# Patient Record
Sex: Female | Born: 1940
Health system: Southern US, Community
[De-identification: ages and names within clinical notes are randomized; demographics above are authoritative.]

## PROBLEM LIST (undated history)

## (undated) DIAGNOSIS — M8580 Other specified disorders of bone density and structure, unspecified site: Secondary | ICD-10-CM

## (undated) DIAGNOSIS — S060XAA Concussion with loss of consciousness status unknown, initial encounter: Secondary | ICD-10-CM

## (undated) DIAGNOSIS — K5790 Diverticulosis of intestine, part unspecified, without perforation or abscess without bleeding: Secondary | ICD-10-CM

## (undated) DIAGNOSIS — Z5189 Encounter for other specified aftercare: Secondary | ICD-10-CM

## (undated) DIAGNOSIS — D649 Anemia, unspecified: Secondary | ICD-10-CM

## (undated) DIAGNOSIS — K5792 Diverticulitis of intestine, part unspecified, without perforation or abscess without bleeding: Secondary | ICD-10-CM

## (undated) DIAGNOSIS — T7840XA Allergy, unspecified, initial encounter: Secondary | ICD-10-CM

## (undated) DIAGNOSIS — D126 Benign neoplasm of colon, unspecified: Secondary | ICD-10-CM

## (undated) DIAGNOSIS — M1990 Unspecified osteoarthritis, unspecified site: Secondary | ICD-10-CM

## (undated) DIAGNOSIS — I10 Essential (primary) hypertension: Secondary | ICD-10-CM

## (undated) DIAGNOSIS — N390 Urinary tract infection, site not specified: Secondary | ICD-10-CM

## (undated) DIAGNOSIS — F32A Depression, unspecified: Secondary | ICD-10-CM

## (undated) DIAGNOSIS — F419 Anxiety disorder, unspecified: Secondary | ICD-10-CM

## (undated) DIAGNOSIS — S060X9A Concussion with loss of consciousness of unspecified duration, initial encounter: Secondary | ICD-10-CM

## (undated) DIAGNOSIS — J45909 Unspecified asthma, uncomplicated: Secondary | ICD-10-CM

## (undated) DIAGNOSIS — K219 Gastro-esophageal reflux disease without esophagitis: Secondary | ICD-10-CM

## (undated) DIAGNOSIS — C50919 Malignant neoplasm of unspecified site of unspecified female breast: Secondary | ICD-10-CM

## (undated) DIAGNOSIS — H269 Unspecified cataract: Secondary | ICD-10-CM

## (undated) DIAGNOSIS — G709 Myoneural disorder, unspecified: Secondary | ICD-10-CM

## (undated) DIAGNOSIS — U071 COVID-19: Secondary | ICD-10-CM

## (undated) DIAGNOSIS — J189 Pneumonia, unspecified organism: Secondary | ICD-10-CM

## (undated) DIAGNOSIS — M7630 Iliotibial band syndrome, unspecified leg: Secondary | ICD-10-CM

## (undated) HISTORY — DX: Malignant neoplasm of unspecified site of unspecified female breast: C50.919

## (undated) HISTORY — DX: Diverticulosis of intestine, part unspecified, without perforation or abscess without bleeding: K57.90

## (undated) HISTORY — DX: Myoneural disorder, unspecified: G70.9

## (undated) HISTORY — DX: Encounter for other specified aftercare: Z51.89

## (undated) HISTORY — DX: Concussion with loss of consciousness status unknown, initial encounter: S06.0XAA

## (undated) HISTORY — DX: Concussion with loss of consciousness of unspecified duration, initial encounter: S06.0X9A

## (undated) HISTORY — DX: Urinary tract infection, site not specified: N39.0

## (undated) HISTORY — PX: CATARACT EXTRACTION W/ INTRAOCULAR LENS  IMPLANT, BILATERAL: SHX1307

## (undated) HISTORY — DX: Depression, unspecified: F32.A

## (undated) HISTORY — DX: Unspecified asthma, uncomplicated: J45.909

## (undated) HISTORY — PX: BREAST LUMPECTOMY: SHX2

## (undated) HISTORY — DX: COVID-19: U07.1

## (undated) HISTORY — PX: POLYPECTOMY: SHX149

## (undated) HISTORY — DX: Other specified disorders of bone density and structure, unspecified site: M85.80

## (undated) HISTORY — DX: Unspecified cataract: H26.9

## (undated) HISTORY — DX: Anxiety disorder, unspecified: F41.9

## (undated) HISTORY — DX: Diverticulitis of intestine, part unspecified, without perforation or abscess without bleeding: K57.92

## (undated) HISTORY — DX: Essential (primary) hypertension: I10

## (undated) HISTORY — PX: OCCIPITAL NEURECTOMY: SHX2096

## (undated) HISTORY — DX: Gastro-esophageal reflux disease without esophagitis: K21.9

## (undated) HISTORY — DX: Allergy, unspecified, initial encounter: T78.40XA

## (undated) HISTORY — DX: Iliotibial band syndrome, unspecified leg: M76.30

## (undated) HISTORY — DX: Benign neoplasm of colon, unspecified: D12.6

## (undated) HISTORY — PX: TRIGGER FINGER RELEASE: SHX641

## (undated) HISTORY — PX: OTHER SURGICAL HISTORY: SHX169

## (undated) HISTORY — PX: UPPER GASTROINTESTINAL ENDOSCOPY: SHX188

## (undated) HISTORY — DX: Anemia, unspecified: D64.9

## (undated) HISTORY — PX: COLONOSCOPY: SHX174

## (undated) HISTORY — DX: Pneumonia, unspecified organism: J18.9

---
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
## 1986-11-11 HISTORY — PX: APPENDECTOMY: SHX54

## 1986-11-11 HISTORY — PX: TOTAL ABDOMINAL HYSTERECTOMY: SHX209

## 1992-11-11 HISTORY — PX: NASAL SINUS SURGERY: SHX719

## 1998-12-07 ENCOUNTER — Emergency Department (HOSPITAL_COMMUNITY): Admission: EM | Admit: 1998-12-07 | Discharge: 1998-12-07 | Payer: Self-pay | Admitting: Emergency Medicine

## 1999-09-12 HISTORY — PX: BACK SURGERY: SHX140

## 1999-09-28 ENCOUNTER — Inpatient Hospital Stay (HOSPITAL_COMMUNITY): Admission: RE | Admit: 1999-09-28 | Discharge: 1999-09-30 | Payer: Self-pay | Admitting: Neurosurgery

## 1999-09-28 ENCOUNTER — Encounter: Payer: Self-pay | Admitting: Neurosurgery

## 2000-03-02 ENCOUNTER — Ambulatory Visit (HOSPITAL_COMMUNITY): Admission: RE | Admit: 2000-03-02 | Discharge: 2000-03-02 | Payer: Self-pay | Admitting: Neurosurgery

## 2000-03-02 ENCOUNTER — Encounter: Payer: Self-pay | Admitting: Neurosurgery

## 2002-08-11 HISTORY — PX: LIPOMA EXCISION: SHX5283

## 2002-12-21 ENCOUNTER — Other Ambulatory Visit: Admission: RE | Admit: 2002-12-21 | Discharge: 2002-12-21 | Payer: Self-pay | Admitting: Obstetrics and Gynecology

## 2004-07-13 ENCOUNTER — Encounter: Payer: Self-pay | Admitting: Cardiology

## 2004-07-13 ENCOUNTER — Observation Stay (HOSPITAL_COMMUNITY): Admission: EM | Admit: 2004-07-13 | Discharge: 2004-07-14 | Payer: Self-pay | Admitting: Emergency Medicine

## 2004-10-18 ENCOUNTER — Encounter
Admission: RE | Admit: 2004-10-18 | Discharge: 2004-10-18 | Payer: Self-pay | Admitting: Physical Medicine and Rehabilitation

## 2004-11-09 ENCOUNTER — Encounter
Admission: RE | Admit: 2004-11-09 | Discharge: 2004-11-09 | Payer: Self-pay | Admitting: Physical Medicine and Rehabilitation

## 2005-06-04 ENCOUNTER — Ambulatory Visit: Payer: Self-pay | Admitting: Internal Medicine

## 2005-09-02 ENCOUNTER — Ambulatory Visit: Payer: Self-pay | Admitting: Internal Medicine

## 2005-12-02 ENCOUNTER — Ambulatory Visit: Payer: Self-pay | Admitting: Internal Medicine

## 2006-02-17 ENCOUNTER — Ambulatory Visit: Payer: Self-pay | Admitting: Internal Medicine

## 2006-03-11 ENCOUNTER — Ambulatory Visit: Payer: Self-pay | Admitting: Internal Medicine

## 2006-06-09 ENCOUNTER — Ambulatory Visit: Payer: Self-pay | Admitting: Internal Medicine

## 2007-06-24 ENCOUNTER — Ambulatory Visit: Payer: Self-pay | Admitting: Internal Medicine

## 2007-07-08 ENCOUNTER — Ambulatory Visit: Payer: Self-pay | Admitting: Internal Medicine

## 2009-11-11 DIAGNOSIS — C50919 Malignant neoplasm of unspecified site of unspecified female breast: Secondary | ICD-10-CM

## 2009-11-11 HISTORY — DX: Malignant neoplasm of unspecified site of unspecified female breast: C50.919

## 2010-09-04 HISTORY — PX: BREAST LUMPECTOMY: SHX2

## 2010-09-05 ENCOUNTER — Ambulatory Visit: Payer: Self-pay | Admitting: Vascular Surgery

## 2010-12-02 ENCOUNTER — Encounter: Payer: Self-pay | Admitting: Radiology

## 2011-01-17 ENCOUNTER — Ambulatory Visit: Payer: Medicare Other | Attending: Radiation Oncology | Admitting: Radiation Oncology

## 2011-01-17 ENCOUNTER — Other Ambulatory Visit: Payer: Self-pay | Admitting: Radiation Oncology

## 2011-01-17 DIAGNOSIS — C50919 Malignant neoplasm of unspecified site of unspecified female breast: Secondary | ICD-10-CM | POA: Insufficient documentation

## 2011-01-17 DIAGNOSIS — G589 Mononeuropathy, unspecified: Secondary | ICD-10-CM | POA: Insufficient documentation

## 2011-01-17 DIAGNOSIS — H6690 Otitis media, unspecified, unspecified ear: Secondary | ICD-10-CM | POA: Insufficient documentation

## 2011-01-17 DIAGNOSIS — L589 Radiodermatitis, unspecified: Secondary | ICD-10-CM | POA: Insufficient documentation

## 2011-01-17 DIAGNOSIS — Z90710 Acquired absence of both cervix and uterus: Secondary | ICD-10-CM | POA: Insufficient documentation

## 2011-01-17 DIAGNOSIS — L538 Other specified erythematous conditions: Secondary | ICD-10-CM | POA: Insufficient documentation

## 2011-01-17 DIAGNOSIS — R5381 Other malaise: Secondary | ICD-10-CM | POA: Insufficient documentation

## 2011-01-17 DIAGNOSIS — Z510 Encounter for antineoplastic radiation therapy: Secondary | ICD-10-CM | POA: Insufficient documentation

## 2011-01-17 DIAGNOSIS — Y842 Radiological procedure and radiotherapy as the cause of abnormal reaction of the patient, or of later complication, without mention of misadventure at the time of the procedure: Secondary | ICD-10-CM | POA: Insufficient documentation

## 2011-01-17 DIAGNOSIS — Z9079 Acquired absence of other genital organ(s): Secondary | ICD-10-CM | POA: Insufficient documentation

## 2011-01-17 LAB — URINALYSIS, MICROSCOPIC - CHCC
Glucose: NEGATIVE g/dL
Nitrite: POSITIVE
Protein: <30 mg/dL
Specific Gravity, Urine: 1.025 (ref 1.003–1.035)

## 2011-01-19 LAB — URINE CULTURE

## 2011-03-26 NOTE — Procedures (Signed)
DUPLEX DEEP VENOUS EXAM - LOWER EXTREMITY   INDICATION:  A palpable area within the left lower extremity calf.   HISTORY:  Edema:  No.  Trauma/Surgery:  Patient is status post breast lumpectomy 09/04/2010.  Pain:  No.  PE:  No.  Previous DVT:  No.  Anticoagulants:  No.  Other:   DUPLEX EXAM:                CFV   SFV   PopV  PTV    GSV                R  L  R  L  R  L  R   L  R  L  Thrombosis    o  o     o     o      o     o  Spontaneous   +  +     +     +      +     +  Phasic        +  +     +     +      +     +  Augmentation  +  +     +     +      +     +  Compressible  +  +     +     +      +     +  Competent     +  +     +     +      +     +   Legend:  + - yes  o - no  p - partial  D - decreased   IMPRESSION:  No evidence of acute deep venous thrombosis or superficial  thrombophlebitis within the left lower extremity.    _____________________________  Quita Skye Hart Rochester, M.D.   OD/MEDQ  D:  09/05/2010  T:  09/05/2010  Job:  [id number]

## 2011-03-29 NOTE — H&P (Signed)
[name], [name]                           ACCOUNT NO.:  [id number]   MEDICAL RECORD NO.:  [id number]                   PATIENT TYPE:  EMS   LOCATION:  MAJO                                 FACILITY:  [facility]   PHYSICIAN:  Olga Millers, M.D. [facility]            DATE OF BIRTH:  [birth year]   DATE OF ADMISSION:  07/13/2004  DATE OF DISCHARGE:                                HISTORY & PHYSICAL   Ms. Kearley is a 70 year old female who is followed by E. Graceann Congress,  M.D., with a history of mitral valve prolapse.  She presents for evaluation  of chest pain.  She did state that she had epigastric pain approximately two  years ago in the setting of a stressful move from [place].  At that time  she had a nuclear study by Dr. Graceann Congress, and this showed an ejection  fraction of 70% as well as breast attenuation, but there was no ischemia or  scar.  She does aerobics routinely and does not have chest pain with  exertion.  There is no dyspnea on exertion, orthopnea, PND, pedal edema,  palpitations, presyncope, syncope.  The patient has had problems with aura  in the past where she has visual disturbances.  These are typically circles  in her eyes.  She has been evaluated by Dr. Cecilie Kicks in the past as well as  Dr. Alwyn Ren, and the workup has been negative.  This morning at approximately  6:30 she developed some visual aura.  She subsequently had epigastric  pressure.  There was associated weakness in her upper extremities  bilaterally.  She denies any loss of sensation, dysarthria, or incontinence.  There was no associated shortness of breath or diaphoresis, but there was  mild nausea.  The pain was not pleuritic.  Because of her symptoms, she  presented to the emergency room.  At the time of the evaluation, her  symptoms are much improved but still present.  Of note, she denies any  melena or acholic stools.  She has not had any recent travel.   Her medications at home include:  1.   Wellbutrin XL 300 mg p.o. daily.  2.  Recent typhoid pill, as she is planning on traveling to [place].  3.  Multivitamin.  4.  Vitamin B complex.  5.  Calcium.   She has no known drug allergies.   SOCIAL HISTORY:  She does smoke, but she does occasionally consume alcohol.   FAMILY HISTORY:  Positive for coronary artery disease.   PAST MEDICAL HISTORY:  There is no diabetes mellitus, hypertension, or  hyperlipidemia.  She has had previous mitral valve prolapse and a murmur by  report.   PAST SURGICAL HISTORY:  She has had prior back surgery and left breast cyst  removed.  She has also had sinus surgery and a prior hysterectomy.   REVIEW OF SYSTEMS:  She denies any headaches of fevers  or chills.  There is  no productive cough or hemoptysis.  There is no dysphagia, odynophagia,  melena, or hematochezia.  There is no dysuria or hematuria.  There is no  rash or seizure activity.  There is no orthopnea, PND, or pedal edema.  The  remaining systems are negative.   PHYSICAL EXAMINATION:  VITAL SIGNS:  She is afebrile.  Her pulse is 90.  Her  blood pressure in the right upper extremity is 146/79 and in the left upper  extremity is 147/80.  She is afebrile.  GENERAL:  She is well-developed and well-nourished, in no acute distress.  Her skin is warm and dry.  She does not appear to be depressed, and there is  no peripheral clubbing.  HEENT:  Unremarkable with normal eyelids.  NECK:  Supple with normal upstroke bilateral and no bruits.  There is no  jugular venous distention and no thyromegaly noted.  CHEST:  Clear to auscultation, normal expansion.  CARDIOVASCULAR:  Regular rate and rhythm with normal S1 and S2.  I cannot  appreciate murmurs, rubs, or gallops.  ABDOMEN:  There is no right upper quadrant tenderness or tenderness in any  other part of the exam.  There are no masses appreciated.  There is no  hepatosplenomegaly.  There is no abdominal bruit.  She has 2+ femoral pulses   bilaterally and no bruits.  EXTREMITIES:  No edema, and I can palpate no cord.  She has 2+ dorsalis  pedis pulses bilaterally.  NEUROLOGIC:  Grossly intact.   Her electrocardiogram shows normal sinus rhythm with nonspecific ST changes.  Initial head CT is negative.  The remaining laboratories are pending at the  time of this dictation.   DIAGNOSES:  1.  Chest pain.  2.  Visual aura.   PLAN:  Ms. Scardino presents with chest pain of uncertain etiology.  There are  no electrocardiographic changes, and the description is somewhat atypical.  However, we will treat with aspirin and Lopressor and we will add  intravenous heparin if her enzymes are positive.  We will rule out  myocardial infarction with serial enzymes, and we will check an  echocardiogram for wall motion.  If these are negative, then we will plan a  Cardiolite tomorrow morning for risk stratification.  I will add Protonix  for the possibility of GI pain.  We will also check liver functions, amylase  and lipase.  Of note, there is no right upper quadrant tenderness on exam.  It should be noted that she had equal blood pressures in both arms.  Her  history does not seem consistent with a pulmonary embolus.  She is  complaining of an aura, but she has had this multiple times previously and  apparently has had a previous negative evaluation.  Her head CT today is  negative.  We will ask her to follow up with Dr. Alwyn Ren concerning these  after discharge, as they have resolved at present.                                                Olga Millers, M.D. [facility]    BC/MEDQ  D:  07/13/2004  T:  07/13/2004  Job:  [id number]

## 2011-03-29 NOTE — Consult Note (Signed)
[place]  Patient:    [name]                         MRN: [id number] Proc. Date: 09/28/99 Adm. Date:  [id number] Attending:  Barton Fanny CC:         Hewitt Shorts, M.D.                          Consultation Report  HISTORY OF PRESENT ILLNESS:  The patient is a 70 year old, right-handed white female who was evaluated for a right lumbar radiculopathy.  She explains that a  week ago, she developed back pain which worsened and extended down through the right lower extremity to the right buttock, posterior thigh, lateral right leg, and into the dorsum of the right foot towards her right great toe.  The pain has suddenly worsened.  She developed numbness in the right great toe and began to notice that her right foot would flop.  She underwent an MRI scan in [place] which revealed degenerative disk disease at multiple levels of the lumbar spine. There was spondylitic disk protrusion into the left L2-3 foramen.  There is mild disk protrusion to the left at L4-5 into the neural foramen, and there is a right L5-S1 disk herniation with a fragment that has migrated rostrally behind the body of L5.  She has been treated with Tylenol with Codeine as well as Celebrex without relief.  At this time, she is admitted for a right L5-S1 lumbar laminotomy and microdiskectomy.  PAST MEDICAL HISTORY:  There is no history of hypertension, myocardial infarction, cancer, stroke, diabetes, peptic ulcer disease, or lung disease.  Previous surgery includes left breast surgery for fibroadenoma in 1962 with sinus surgery in 1994 and a hysterectomy in 1998.  ALLERGIES:  She denies allergies to medications.  CURRENT MEDICATIONS: 1. Premarin 0.625 mg q.d. for hormone replacement therapy. 2. Nasacort sprays, 2 sprays p.r.n. 3. Celebrex q.d. for leg and back pain. 4. Tylenol No. 3 every 4 to 6 hours for leg and back pain.  FAMILY  HISTORY:  Her mother died at age 56 of congestive heart failure.  Her father died at age 72 of lung cancer.  SOCIAL HISTORY:  The patient is married.  She and her husband work in [address], and live both in [place] part time and [place] part time.  She has a daughter who is a Advice worker in a family practice office in [place].  Her other daughter committed suicide two months ago.  The patient is continuing to mourn at this time.  The patient does not smoke.  She does drink wine on a daily basis.  She does not have a history of substance abuse.  REVIEW OF SYSTEMS:  Notable for those difficulties described in her History of Present Illness and Past Medical History but is otherwise unremarkable.  PHYSICAL EXAMINATION:  GENERAL:  The patient is a well-developed, well-nourished white female in no acute distress.  VITAL SIGNS:  Height is 5 feet, 2-1/2 inches, weight 168 pounds.  She is afebrile.  LUNGS: Clear to auscultation.  HEART:  Regular rate and rhythm.  Normal S1 and S2.  No murmur.  ABDOMEN:  Soft, nondistended.  Bowel sounds are present.  EXTREMITIES:  No clubbing, cyanosis, or edema.  VASCULAR:  Shows 1 to 2+ pulses in dorsalis pedis and posterior tibial arteries  bilaterally.  MUSCULOSKELETAL:  There is no tenderness to palpation of the lumbar spinous process or paralumbar musculature.  She will flex 90 degrees but has pain down to the right lower extremity when she flexes forwards.  She has a jolt of pain when she extends her back.  Straight leg raising is negative on the left, but on the right, there is a positive straight leg raising 45 degrees with pain running down into the right lower extremity.  NEUROLOGIC:  Examination shows significant weakness in distal right lower extremity.  Dorsiflexion and extensor hallucis longus are both 3/5.  Plantar flexion is 4+/5.  The corresponding left lower extremity musculature is  all 5/5. Sensation is decreased to pinprick in the lateral aspect of her right leg. Reflexes are 2 at the quadriceps bilaterally.  The left gastrocnemius is absent, the right is 1.  The toes are downgoing bilaterally.  IMPRESSION:  Right L5 radiculopathy with significant weakness of extensor hallucis longus and dorsiflexor secondary to a right L5-S1 lumbar disk herniation with a  free fragment that has migrated rostrally behind the body of L5.  PLAN:  The patient will be admitted for a right L5-S1 lumbar laminotomy and microdiskectomy.  We discussed the nature of surgery, alternatives to the surgical procedure, typical length of surgery, hospital stay, and overall recuperation, limitations during the postoperative period, and risk of surgery including risk of infection, bleeding, possible need for transfusion, risk of nerve dysfunction, pain, weakness, numbness, paresthesias, the risk of recurrent disk herniation, possible need for further surgery, anesthetic risks of myocardial infarction, stroke, pneumonia, and death.  Understanding all of this, she does wish to proceed with surgery.  She understands the uncertainty of motor recovery, but she also understands that the longer surgical decompression is put off, the lower the chance of motor recovery.  Again, understanding all of this, she does wish to proceed ith surgery and is admitted for such. DD:  09/28/99 TD:  09/28/99 Job: [id number]

## 2011-03-29 NOTE — Discharge Summary (Signed)
[name], [name]                           ACCOUNT NO.:  [id number]   MEDICAL RECORD NO.:  [id number]                   PATIENT TYPE:  INP   LOCATION:  4705                                 FACILITY:  [facility]   PHYSICIAN:  Olga Millers, M.D. [facility]            DATE OF BIRTH:  Jun 28, 1941   DATE OF ADMISSION:  07/13/2004  DATE OF DISCHARGE:  07/14/2004                                 DISCHARGE SUMMARY   PROCEDURES:  1.  2D echocardiogram.  2.  CT of the head.   HOSPITAL COURSE:  [name] is a 70 year old female with no known history  of coronary artery disease.  She was in her usual state of health and at  6:30 on the day of admission got up to do some work.  She had an episode of  blurred vision that lasted about 30 minutes and was a partial visual field  loss by her description, on the left side.  She had similar episodes in 1996  and 1997, and her primary care physician did not find a clear cause, but  felt that they were possibly secondary to an atypical migraine.  Associated  with this, she had epigastric pressure and was demonstrating a positive  Levine's sign.  She also had bilateral arm weakness and some dizziness and  nausea.  She also complained of some fatigue and a little dysphagia.  She  had had the visual field problems before several years ago, but never this  symptom combination before.  Her last episode of chest pressure was in 2003  and not associated with any other symptoms and felt secondary to stress due  to the loss of her daughter.  She had an exercise treadmill Cardiolite at  that time that was negative.  She was admitted for further evaluation and  treatment.   Her enzymes were negative for MI and her symptoms resolved.  She had had no  recent exertional symptoms, and she was felt at acceptable risk for an  outpatient Cardiolite.  She had a 2D echocardiogram as part of her  evaluation, and it showed a normal EF with no wall motion abnormalities.  She also  had a reported history of mitral valve prolapse, although her  mitral valve structure was normal on echocardiogram.  She had no further  episodes of blurred vision.  A head CT was done as well and it showed no  significant abnormality.  [name] was considered stable for discharge on  July 14, 2004, and is to follow up as an outpatient with cardiology, and  with her primary care physician, with a possible referral to neurology as  needed.   DISCHARGE CONDITION:  Improved.   DISCHARGE DIAGNOSES:  1.  Chest pain, negative myocardial infarction by enzymes this admission      with outpatient Cardiolite for ischemia.  2.  Intermittent visual field loss, follow up with primary care.  3.  History of chest pain 2003 with a negative Cardiolite.  4.  History of anxiety, well-controlled on Wellbutrin.  5.  Family history of coronary artery disease and transient ischemic      attacks.  6.  Status post back surgery.  7.  Status post left breast, thyroid removal.  8.  Colonoscopy.  9.  Sinus surgery.  10. Hysterectomy.  11. Borderline hypertension.   DISCHARGE INSTRUCTIONS:  Her activity level is to include no strenuous  activity until after stress test.  She is to stick to a low fat diet.  She  is to follow up with a stress test next week and with Dr. Corinda Gubler.   DISCHARGE MEDICATIONS:  1.  Wellbutrin XL 300 mg daily.  2.  Typhoid prophylaxis as prescribed by the [facility].  3.  Aspirin 81 mg p.o. daily.  4.  Toprol XL 25 mg daily.      Theodore Demark, P.A. [facility]                  Olga Millers, M.D. [facility]    RB/MEDQ  D:  07/14/2004  T:  07/15/2004  Job:  [id number]   cc:   Titus Dubin. Alwyn Ren, M.D. [facility]   Bea Laura Graceann Congress, M.D.

## 2011-05-28 ENCOUNTER — Ambulatory Visit
Admission: RE | Admit: 2011-05-28 | Discharge: 2011-05-28 | Disposition: A | Discharge status: Home / Self Care | Payer: Medicare Other | Source: Ambulatory Visit | Attending: Radiation Oncology | Admitting: Radiation Oncology

## 2011-06-24 ENCOUNTER — Institutional Professional Consult (permissible substitution): Payer: Self-pay | Admitting: Pulmonary Disease

## 2011-06-26 ENCOUNTER — Telehealth: Payer: Self-pay | Admitting: Pulmonary Disease

## 2011-06-26 ENCOUNTER — Ambulatory Visit (INDEPENDENT_AMBULATORY_CARE_PROVIDER_SITE_OTHER): Payer: Medicare Other | Admitting: Pulmonary Disease

## 2011-06-26 ENCOUNTER — Encounter: Payer: Self-pay | Admitting: Pulmonary Disease

## 2011-06-26 ENCOUNTER — Ambulatory Visit (INDEPENDENT_AMBULATORY_CARE_PROVIDER_SITE_OTHER)
Admission: RE | Admit: 2011-06-26 | Discharge: 2011-06-26 | Disposition: A | Discharge status: Home / Self Care | Payer: Medicare Other | Source: Ambulatory Visit | Attending: Pulmonary Disease | Admitting: Pulmonary Disease

## 2011-06-26 VITALS — BP 108/62 | HR 81 | Temp 97.9°F | Ht 61.0 in | Wt 153.2 lb

## 2011-06-26 DIAGNOSIS — R05 Cough: Secondary | ICD-10-CM

## 2011-06-26 NOTE — Progress Notes (Signed)
  Subjective:    Patient ID: [name], female    DOB: December 02, 1940, 70 y.o.   MRN: [id number]  HPI The patient is a 70 year old female who I've been asked to see for ongoing chest congestion and persistent pulmonary symptoms.  She was in her usual state of health with no breathing issues interval April of this year, when she began to develop head congestion and ear discomfort.  She was seen by her ENT, and was given over-the-counter medications.  She continued to have symptoms, and ultimately required a myringotomy, and she was also having sinus congestion during that time.  She developed a cough along with this was treated with a course of doxycycline for 10 days.  She continued to have persistent head congestion and cough, and was seen at [facility].  A chest x-ray there has been reviewed and is unremarkable.  She was treated with a course of Levaquin for 5 days.  Since that time, the patient states that she is seen no appreciable difference in her symptomatology.  She has also been tried on Qvar and did not feel that it helped.  Currently she continues to have a fullness in her throat, a barking cough with occasional scant mucus, and feels that her chest is "uncomfortable".  She continues to have postnasal drip with head and nasal congestion.  She has tried to exercise but it results in worsening cough.  The patient has had an issue with reflux in the past, and has been on Prilosec.  She has stopped this, but most recently has been having belching but no frank heartburn.   Review of Systems  Constitutional: Negative for fever and unexpected weight change.  HENT: Positive for ear pain and congestion. Negative for nosebleeds, sore throat, rhinorrhea, sneezing, trouble swallowing, dental problem, postnasal drip and sinus pressure.   Eyes: Negative for redness and itching.  Respiratory: Positive for cough. Negative for chest tightness, shortness of breath and wheezing.   Cardiovascular: Positive for  chest pain. Negative for palpitations and leg swelling.  Gastrointestinal: Negative for nausea and vomiting.  Genitourinary: Negative for dysuria.  Musculoskeletal: Negative for joint swelling.  Skin: Negative for rash.  Neurological: Negative for headaches.  Hematological: Does not bruise/bleed easily.  Psychiatric/Behavioral: Negative for dysphoric mood. The patient is not nervous/anxious.        Objective:   Physical Exam Constitutional:  Well developed, no acute distress  HENT:  Nares patent without discharge  Oropharynx without exudate, palate and uvula are normal  Eyes:  Perrla, eomi, no scleral icterus  Neck:  No JVD, no TMG  Cardiovascular:  Normal rate, regular rhythm, no rubs or gallops.  No murmurs        Intact distal pulses  Pulmonary :  Normal breath sounds, no stridor or respiratory distress   No rales, rhonchi, or wheezing  Abdominal:  Soft, nondistended, bowel sounds present.  No tenderness noted.   Musculoskeletal:  No lower extremity edema noted.  Lymph Nodes:  No cervical lymphadenopathy noted  Skin:  No cyanosis noted  Neurologic:  Alert, appropriate, moves all 4 extremities without obvious deficit.         Assessment & Plan:

## 2011-06-26 NOTE — Telephone Encounter (Signed)
Pt seen today 8.15.12 and pt instructions state using zyrtec or allegra otc.    KC, please advise if allegra-d is okay or if you prefer the regular. Thanks!

## 2011-06-26 NOTE — Patient Instructions (Signed)
Take your flonase 2 sprays each nostril each am everyday until next visit. Take zyrtec or allegra OTC everyday until next visit Start dexilant 60mg  one each day until followup for possible acid reflux Will do scan of your sinuses to evaluate for chronic sinusitis.  Will call you with results.  Stop qvar. followup with me in 3 weeks, with breathing tests the same day

## 2011-06-26 NOTE — Assessment & Plan Note (Signed)
The patient has ongoing cough and other symptoms that are most suggestive of an upper airway etiology.  She has ongoing nasal congestion and postnasal drip, and has a history of chronic sinusitis that required surgery in 1994.  It may well be that acute on chronic sinusitis is driving all of her symptoms.  I also think she has some component of it cyclical coughing, and may have a contribution from underlying laryngopharyngeal reflux.  It is unclear whether any of this is from obstructive lung disease.  Her current chest x-ray is without acute process, but she does have the CT from December of 2011 that was normal except for very mild centrilobular emphysema.  The patient has minimal smoking history.  At this point, I would like to check a CT of her sinuses, try more aggressive nasal hygiene, and also treat emperically for LPR.  I would like to see the patient back after a few weeks, and we'll schedule for pulmonary function studies for further evaluation.

## 2011-06-26 NOTE — Telephone Encounter (Signed)
Can use the "d" short term, but then change over to the plain.

## 2011-06-27 ENCOUNTER — Telehealth: Payer: Self-pay | Admitting: *Deleted

## 2011-06-27 MED ORDER — LEVOFLOXACIN 750 MG PO TABS: 750.0000 mg | ORAL_TABLET | Freq: Every day | ORAL | Status: AC

## 2011-06-27 NOTE — Telephone Encounter (Signed)
Pt called back. Informed her of KC's response.  Pt verbalized understanding and denied any questions.

## 2011-06-27 NOTE — Telephone Encounter (Signed)
Pt requested a copy of consult note from yesterday and CT of sinuses report.  KC, please advise if you are ok with this.  Pt requested these records be faxed to her fax # at [phone]

## 2011-06-27 NOTE — Telephone Encounter (Signed)
Records sent to pt's fax.

## 2011-06-27 NOTE — Telephone Encounter (Signed)
Ok with me 

## 2011-06-27 NOTE — Telephone Encounter (Signed)
[id number] x1 

## 2011-07-09 ENCOUNTER — Telehealth: Payer: Self-pay | Admitting: Pulmonary Disease

## 2011-07-09 NOTE — Telephone Encounter (Signed)
Spoke with pt. She states that after finishing 14 days of levaquin, her cough is no better at all. Requests appt with KC. I advised nothing open with him this wk, but I have sched her to see TP tomorrow am at 9:45 and advised seek emergency care sooner if needed and pt verbalized understanding.

## 2011-07-10 ENCOUNTER — Ambulatory Visit (INDEPENDENT_AMBULATORY_CARE_PROVIDER_SITE_OTHER)
Admission: RE | Admit: 2011-07-10 | Discharge: 2011-07-10 | Disposition: A | Discharge status: Home / Self Care | Payer: Medicare Other | Source: Ambulatory Visit | Attending: Adult Health | Admitting: Adult Health

## 2011-07-10 ENCOUNTER — Ambulatory Visit (INDEPENDENT_AMBULATORY_CARE_PROVIDER_SITE_OTHER): Payer: Medicare Other | Admitting: Adult Health

## 2011-07-10 ENCOUNTER — Encounter: Payer: Self-pay | Admitting: Adult Health

## 2011-07-10 DIAGNOSIS — R05 Cough: Secondary | ICD-10-CM

## 2011-07-10 DIAGNOSIS — C50919 Malignant neoplasm of unspecified site of unspecified female breast: Secondary | ICD-10-CM

## 2011-07-10 MED ORDER — PREDNISONE 10 MG PO TABS: ORAL_TABLET | ORAL | Status: DC

## 2011-07-10 MED ORDER — HYDROCODONE-HOMATROPINE 5-1.5 MG/5ML PO SYRP: ORAL_SOLUTION | ORAL | Status: DC

## 2011-07-10 MED ORDER — LEVOFLOXACIN 500 MG PO TABS: 500.0000 mg | ORAL_TABLET | Freq: Every day | ORAL | Status: DC

## 2011-07-10 NOTE — Progress Notes (Signed)
Subjective:    Patient ID: [name], female    DOB: 1941/08/13, 71 y.o.   MRN: [id number]  HPI Initial consult 06/26/11   06/26/11 --The patient is a 70 year old female who I've been asked to see for ongoing chest congestion and persistent pulmonary symptoms.  She was in her usual state of health with no breathing issues interval April of this year, when she began to develop head congestion and ear discomfort.  She was seen by her ENT, and was given over-the-counter medications.  She continued to have symptoms, and ultimately required a myringotomy, and she was also having sinus congestion during that time.  She developed a cough along with this was treated with a course of doxycycline for 10 days.  She continued to have persistent head congestion and cough, and was seen at [facility].  A chest x-ray there has been reviewed and is unremarkable.  She was treated with a course of Levaquin for 5 days.  Since that time, the patient states that she is seen no appreciable difference in her symptomatology.  She has also been tried on Qvar and did not feel that it helped.  Currently she continues to have a fullness in her throat, a barking cough with occasional scant mucus, and feels that her chest is "uncomfortable".  She continues to have postnasal drip with head and nasal congestion.  She has tried to exercise but it results in worsening cough.  The patient has had an issue with reflux in the past, and has been on Prilosec.  She has stopped this, but most recently has been having belching but no frank heartburn. >>CT sinus + , rx levaquin x 14 .    07/10/2011 Acute OV  Returns for persistent symptoms. Seen 2 weeks ago for cough x 4 months. She has had recent breast cancer dx 07/2010 underwent lumpectomy. Finished Chemo 02/2011. Finished XRT 04/2011. Currently on Femara. Has upcoming bone scan 07/2011. Says all test have been neg since tx for reoccurence. Complains that her sinus congestion is better but  still has cough and is bad at times. Feels worn out. She Finished 14 days of Levaquin on 07-09-11 . CT sinus showed acute sinusitis ethmoid, left max, and right sphenoid sinsues (06/26/11). She has ov with Dr. Joneen Roach 07-11-11 . Does get some congestion up -yellow at times.  OTC not helping. She has taken 2 abx (Doxycycline and levaquin 750mg  ) and steroids taper previously without any help.  No vomitting or over reflux.  Has been weakened over the last year , chemo was very hard on her.     Review of Systems  Constitutional: Negative for fever and unexpected weight change.  HENT: Positive for nasal  congestion. Negative for nosebleeds, sore throat, rhinorrhea, sneezing, trouble swallowing, dental problem, postnasal drip and sinus pressure.   Eyes: Negative for redness and itching.  Respiratory: Positive for cough. Negative for chest tightness, shortness of breath and wheezing.   Cardiovascular:   Negative for palpitations and leg swelling.  Gastrointestinal: Negative for nausea and vomiting.  Genitourinary: Negative for dysuria.  Musculoskeletal: Negative for joint swelling.  Skin: Negative for rash.  Neurological: Negative for headaches.  Hematological: Does not bruise/bleed easily.  Psychiatric/Behavioral: Negative for dysphoric mood. The patient is not nervous/anxious.        Objective:   Physical Exam Constitutional:  Well developed, no acute distress  HENT:  Nares patent without discharge  Oropharynx without exudate, palate and uvula are normal  Eyes:  Perrla, eomi, no scleral icterus  Neck:  No JVD, no TMG  Cardiovascular:  Normal rate, regular rhythm, no rubs or gallops.  No murmurs        Intact distal pulses  Pulmonary :  Coarse BS w/ upper airway psuedowheeze on forced expiration.  Harsh barky cough    No rales, rhonchi, or wheezing  Abdominal:  Soft, nondistended, bowel sounds present.  No tenderness noted.   Musculoskeletal:  No lower extremity edema  noted.  Lymph Nodes:  No cervical lymphadenopathy noted  Skin:  No cyanosis noted  Neurologic:  Alert, appropriate, moves all 4 extremities without obvious deficit.         Assessment & Plan:

## 2011-07-10 NOTE — Patient Instructions (Addendum)
Extend Levaquin 500mg  for additional 7 days  Mucinex Twice daily  As needed  Congestion  Saline nasal rinses As needed   Delsym 2 tsp Twice daily  For cough  Hydromet 1-2 tsp every 4 hr As needed  Cough- may make you sleepy.  Candy, ice chips, water, NO MINTS to help avoid cough/throat clearing.  Voice rest.  Please contact office for sooner follow up if symptoms do not improve or worsen or seek emergency care  follow up. Dr. Shelle Iron next week as planned  Eat yogurt daily  May restart Align.

## 2011-07-11 NOTE — Progress Notes (Signed)
OV reviewed, along with cxr. Agree with plan as outlined.  She needs followup with me to ensure resolution of her symptoms.   Megan, please make sure pt has ov with me upcoming.

## 2011-07-11 NOTE — Assessment & Plan Note (Signed)
Cyclical cough with underlying sinusitis on CT  Slow to resolve suspect is multifactoral with sinus drainge/sinusitis, GERD and harsh cough Will check xray - hx of XRT recently (prev xray did not show radiation pnuemo. Changes per note. )   Plan:  Extend Levaquin 500mg  for additional 7 days  Mucinex Twice daily  As needed  Congestion  Saline nasal rinses As needed   Delsym 2 tsp Twice daily  For cough  Hydromet 1-2 tsp every 4 hr As needed  Cough- may make you sleepy.  Candy, ice chips, water, NO MINTS to help avoid cough/throat clearing.  Voice rest.  Please contact office for sooner follow up if symptoms do not improve or worsen or seek emergency care  follow up. Dr. Shelle Iron next week as planned  Eat yogurt daily  May restart Align.

## 2011-07-16 ENCOUNTER — Ambulatory Visit (INDEPENDENT_AMBULATORY_CARE_PROVIDER_SITE_OTHER): Payer: Medicare Other | Admitting: Pulmonary Disease

## 2011-07-16 ENCOUNTER — Encounter: Payer: Self-pay | Admitting: Pulmonary Disease

## 2011-07-16 VITALS — BP 108/70 | HR 78 | Temp 97.4°F | Ht 61.0 in | Wt 154.0 lb

## 2011-07-16 DIAGNOSIS — R05 Cough: Secondary | ICD-10-CM

## 2011-07-16 LAB — PULMONARY FUNCTION TEST

## 2011-07-16 NOTE — Patient Instructions (Signed)
Continue with behavioral therapies as much as possible:  Voice limitation, no throat clearing, hard candy, etc. Use cough med as needed to keep cough from escalating. Continue with dexilant for another few weeks, then try to come off the medication.  Please call us if the cough does not resolve over time.

## 2011-07-16 NOTE — Assessment & Plan Note (Signed)
The pt has multifactorial cough that is upper airway in origin.  She has had acute on chronic sinusitis, treated with 3 weeks of antibiotics.  She clearly had an element of LPR and cyclical coughing as well.  She feels she is getting better with PPI, behavioral therapies, treatment of sinus disease.  Her pfts today are unremarkable, as is her cxr last visit.  There is nothing to suggest this is a pulmonary issue.  I have told the pt this will be a slowly resolving process, and may take weeks to totally go away.  She is to call if it does not totally resolve.   Chest ct 2011: mild centrilobular emphysema cxr 04/2011:  No acute process.  Ct sinuses 2012: inflammatory changes ethmoid/left maxiillary , right sphenoid c/w sinusitis  PFT's 2012:  No obstruction, no restriction, minimal decrease in DLCO that corrects with Av.

## 2011-07-16 NOTE — Progress Notes (Signed)
PFT done today. 

## 2011-07-16 NOTE — Progress Notes (Signed)
  Subjective:    Patient ID: [name], female    DOB: 06-30-1941, 70 y.o.   MRN: [id number]  HPI The patient comes in today for followup of her chronic cough.  She was found to have acute on chronic sinusitis, and was ultimately treated with 3 weeks of antibiotics.  She was seen by our nurse practitioner for a persistent cough that was felt to be upper airway in origin.  She was treated for laryngopharyngeal reflux, as well as a cyclical cough mechanism.  The patient states that overall she is much improved from her initial visit, but still has a very mild cough.  It worsens with prolonged voice use, and also exertional activities.  The patient really has not been compliant with using hard candy, and is clearing her throat today in the office.   Review of Systems  Constitutional: Negative for fever and unexpected weight change.  HENT: Positive for congestion and postnasal drip. Negative for ear pain, nosebleeds, sore throat, rhinorrhea, sneezing, trouble swallowing, dental problem and sinus pressure.   Eyes: Negative for redness and itching.  Respiratory: Positive for cough and wheezing. Negative for chest tightness and shortness of breath.   Cardiovascular: Negative for palpitations and leg swelling.  Gastrointestinal: Positive for nausea. Negative for vomiting.  Genitourinary: Negative for dysuria.  Musculoskeletal: Negative for joint swelling.  Skin: Negative for rash.  Neurological: Negative for headaches.  Hematological: Bruises/bleeds easily.  Psychiatric/Behavioral: Negative for dysphoric mood. The patient is not nervous/anxious.        Objective:   Physical Exam Well-developed female in no acute distress Nose without obvious purulence or discharge Lower extremities without edema, no cyanosis noted Alert and oriented, moves all 4 extremities.       Assessment & Plan:

## 2011-07-26 ENCOUNTER — Encounter: Payer: Self-pay | Admitting: Pulmonary Disease

## 2011-09-19 ENCOUNTER — Other Ambulatory Visit: Payer: Medicare Other

## 2011-09-19 ENCOUNTER — Encounter: Payer: Self-pay | Admitting: Internal Medicine

## 2011-09-19 ENCOUNTER — Ambulatory Visit (INDEPENDENT_AMBULATORY_CARE_PROVIDER_SITE_OTHER): Payer: Medicare Other | Admitting: Internal Medicine

## 2011-09-19 VITALS — BP 130/80 | HR 94 | Ht 60.5 in | Wt 157.8 lb

## 2011-09-19 DIAGNOSIS — Z888 Allergy status to other drugs, medicaments and biological substances status: Secondary | ICD-10-CM

## 2011-09-19 DIAGNOSIS — Z880 Allergy status to penicillin: Secondary | ICD-10-CM

## 2011-09-19 DIAGNOSIS — J3089 Other allergic rhinitis: Secondary | ICD-10-CM

## 2011-09-19 DIAGNOSIS — J309 Allergic rhinitis, unspecified: Secondary | ICD-10-CM

## 2011-09-19 DIAGNOSIS — T360X5A Adverse effect of penicillins, initial encounter: Secondary | ICD-10-CM

## 2011-09-19 NOTE — Progress Notes (Signed)
09/19/11- 70 year old female former smoker referred courtesy of Dr. Haroldine Laws for allergy evaluation because of 5 months of head congestion. She had been evaluated previously by [facility] with diagnosis of cyclical cough and sinusitis. She has had ear pressure and discomfort for 3 months, improved but bothering her again. Nasal congestion and ear discomfort has been better since October after medication. She has a history of breast cancer diagnosed September 2011 and treated with lumpectomy, chemotherapy and radiation therapy finally finished in June of 2012. She had been told that allergy problems can develop after chemotherapy. She had no prior history of seasonal rhinitis or asthma But did have sinus surgery in 1987. She has been using saline lavage and Flonase. Pulmonary function tests were normal. She had myringotomy right earJune of 2012. She would also like clarification about possible penicillin allergy. She had been told many years ago that she was allergic to penicillin and this has affected medication choices.  ROS-see HPI Constitutional:   No-   weight loss, night sweats, fevers, chills, fatigue, lassitude. HEENT:   No-  headaches, difficulty swallowing, tooth/dental problems, sore throat,       + sneezing, itching, ear ache, nasal congestion, post nasal drip,  CV:  No-   chest pain, orthopnea, PND, swelling in lower extremities, anasarca,                                  dizziness, palpitations Resp: No-   shortness of breath with exertion or at rest.              No-   productive cough,  No non-productive cough,  No- coughing up of blood.              No-   change in color of mucus.  No- wheezing.   Skin: No-   rash or lesions. GI:  No-   heartburn, indigestion, abdominal pain, nausea, vomiting, diarrhea,                 change in bowel habits, loss of appetite GU: No-   dysuria, change in color of urine, no urgency or frequency.  No- flank pain. MS:  No-   joint pain or swelling.  No-  decreased range of motion.  No- back pain. Neuro-     nothing unusual Psych:  No- change in mood or affect. No depression or anxiety.  No memory loss.  OBJ General- Alert, Oriented, Affect-appropriate, Distress- none acute; wig Skin- rash-none, lesions- none, excoriation- none Lymphadenopathy- none Head- atraumatic            Eyes- Gross vision intact, PERRLA, conjunctivae clear secretions            Ears- Hearing, canals-normal            Nose- Clear, no-Septal dev, mucus, polyps, erosion, perforation             Throat- Mallampati III-IV , mucosa clear , drainage- none, tonsils- atrophic Neck- flexible , trachea midline, no stridor , thyroid nl, carotid no bruit Chest - symmetrical excursion , unlabored           Heart/CV- RRR , no murmur , no gallop  , no rub, nl s1 s2                           - JVD- none , edema- none, stasis changes- none, varices-  none           Lung- crackles right apex, wheeze- none, cough- none , dullness-none, rub- none           Chest wall-  Abd- tender-no, distended-no, bowel sounds-present, HSM- no Br/ Gen/ Rectal- Not done, not indicated Extrem- cyanosis- none, clubbing, none, atrophy- none, strength- nl Neuro- grossly intact to observation

## 2011-09-19 NOTE — Patient Instructions (Signed)
Order- Allergy Profile IgE     For dx allergic rhinitis             Penicilloyl G  IgE - [id number]   For dx penicilin drug allergy            Penicylloyl V IgE - [phone]

## 2011-09-23 ENCOUNTER — Encounter: Payer: Self-pay | Admitting: Internal Medicine

## 2011-09-23 DIAGNOSIS — J3089 Other allergic rhinitis: Secondary | ICD-10-CM | POA: Insufficient documentation

## 2011-09-23 DIAGNOSIS — Z880 Allergy status to penicillin: Secondary | ICD-10-CM | POA: Insufficient documentation

## 2011-09-23 NOTE — Assessment & Plan Note (Signed)
Question whether her history of recurrent rhinitis and eustachian dysfunction isn't allergic condition. Plan in vitro testing. Consider skin testing if necessary.

## 2011-09-23 NOTE — Assessment & Plan Note (Signed)
This is unconfirmed and poorly documented. We discussed related issues. Plan-we can test IgE against penicillin.

## 2011-10-18 ENCOUNTER — Telehealth: Payer: Self-pay | Admitting: Internal Medicine

## 2011-10-18 NOTE — Telephone Encounter (Signed)
[email or web address]  

## 2011-10-21 NOTE — Telephone Encounter (Signed)
Spoke with pt. She states still has not received the results of her allergy tests yet and needs to have copies of these results. She is coming in for appt with [facility] on 10/23/11 and I advised that he will discuss the results with her then and give her copies at that time.

## 2011-10-21 NOTE — Telephone Encounter (Signed)
Pt returning call can be reached at [phone].[name]

## 2011-10-23 ENCOUNTER — Ambulatory Visit (INDEPENDENT_AMBULATORY_CARE_PROVIDER_SITE_OTHER): Payer: Medicare Other | Admitting: Internal Medicine

## 2011-10-23 ENCOUNTER — Encounter: Payer: Self-pay | Admitting: Internal Medicine

## 2011-10-23 VITALS — BP 122/80 | HR 84 | Ht 60.5 in | Wt 158.2 lb

## 2011-10-23 DIAGNOSIS — J3089 Other allergic rhinitis: Secondary | ICD-10-CM

## 2011-10-23 DIAGNOSIS — Z880 Allergy status to penicillin: Secondary | ICD-10-CM

## 2011-10-23 NOTE — Progress Notes (Signed)
09/19/11- 70 year old female former smoker referred courtesy of Dr. Haroldine Laws for allergy evaluation because of 5 months of head congestion. She had been evaluated previously by [facility] with diagnosis of cyclical cough and sinusitis. She has had ear pressure and discomfort for 3 months, improved but bothering her again. Nasal congestion and ear discomfort has been better since October after medication. She has a history of breast cancer diagnosed September 2011 and treated with lumpectomy, chemotherapy and radiation therapy finally finished in June of 2012. She had been told that allergy problems can develop after chemotherapy. She had no prior history of seasonal rhinitis or asthma But did have sinus surgery in 1987. She has been using saline lavage and Flonase. Pulmonary function tests were normal. She had myringotomy right earJune of 2012. She would also like clarification about possible penicillin allergy. She had been told many years ago that she was allergic to penicillin and this has affected medication choices.  [birth year]69 year old female former smoker followed for allergy evaluation because of 5 months of head congestion, rhinitis, cough,  Has had flu and pneumococcal vaccines. She is reassured that she was able to tolerate exposure to grandchildren and her husband who had a flulike illness, but she has not caught anything from them. That makes her feel better about her resistance. Flonase and saline have been helping the mild postnasal drainage. Lab: IgE for penicillin G and penicillin V were not elevated. This makes serious allergy to penicillin much less likely. She wanted that reassurance in case of future need. Allergy profile was negative for specific elevations against, and inhalant allergens. Total IgE was not elevated at 21. She is feeling comfortable at present with no intervention needed. She will continue to see Dr. Haroldine Laws for ENT as needed.  ROS-see HPI Constitutional:   No-    weight loss, night sweats, fevers, chills, fatigue, lassitude. HEENT:   No-  headaches, difficulty swallowing, tooth/dental problems, sore throat,       + sneezing, itching, ear ache, nasal congestion, post nasal drip,  CV:  No-   chest pain, orthopnea, PND, swelling in lower extremities, anasarca,                                  dizziness, palpitations Resp: No-   shortness of breath with exertion or at rest.              No-   productive cough,  No non-productive cough,  No- coughing up of blood.              No-   change in color of mucus.  No- wheezing.   Skin: No-   rash or lesions. GI:  No-   heartburn, indigestion, abdominal pain, nausea, vomiting, diarrhea,                 change in bowel habits, loss of appetite GU: No-   dysuria, change in color of urine, no urgency or frequency.  No- flank pain. MS:  No-   joint pain or swelling.  No- decreased range of motion.  No- back pain. Neuro-     nothing unusual Psych:  No- change in mood or affect. No depression or anxiety.  No memory loss.  OBJ- Exam not repeated today General- Alert, Oriented, Affect-appropriate, Distress- none acute; wig Skin- rash-none, lesions- none, excoriation- none Lymphadenopathy- none Head- atraumatic            Eyes-  Gross vision intact, PERRLA, conjunctivae clear secretions            Ears- Hearing, canals-normal            Nose- Clear, no-Septal dev, mucus, polyps, erosion, perforation             Throat- Mallampati III-IV , mucosa clear , drainage- none, tonsils- atrophic Neck- flexible , trachea midline, no stridor , thyroid nl, carotid no bruit Chest - symmetrical excursion , unlabored           Heart/CV- RRR , no murmur , no gallop  , no rub, nl s1 s2                           - JVD- none , edema- none, stasis changes- none, varices- none           Lung- crackles right apex, wheeze- none, cough- none , dullness-none, rub- none           Chest wall-  Abd- tender-no, distended-no, bowel  sounds-present, HSM- no Br/ Gen/ Rectal- Not done, not indicated Extrem- cyanosis- none, clubbing, none, atrophy- none, strength- nl Neuro- grossly intact to observation

## 2011-10-26 ENCOUNTER — Encounter: Payer: Self-pay | Admitting: Internal Medicine

## 2011-10-26 NOTE — Assessment & Plan Note (Signed)
I cannot exclude a non-IgE allergy mechanism but acute anaphylactic reaction to penicillin is shown to be very unlikely. She can have penicillin therapy if necessary, watching for allergic response such as rash, joint pain, swelling or wheezing.

## 2011-10-26 NOTE — Patient Instructions (Signed)
I will be happy to see again if needed.

## 2011-10-26 NOTE — Assessment & Plan Note (Signed)
We can skin test in the future if needed but to odor that best therapy would be avoidance of known irritants supplemented by use of an over-the-counter antihistamine if needed.

## 2011-11-04 ENCOUNTER — Encounter: Payer: Self-pay | Admitting: Internal Medicine

## 2011-11-06 ENCOUNTER — Telehealth: Payer: Self-pay | Admitting: Internal Medicine

## 2011-11-06 NOTE — Telephone Encounter (Signed)
I spoke with [name] and she states she is needing a copy of her pcn G report she had done on 09/19/11. I do not see this in [name]'s chart. [name] states that Florentina Addison had already called for this and gave her the results on it and is not sure why it is not in her chart. Will hold for when Florentina Addison returns to speak with her about it.

## 2011-11-07 NOTE — Telephone Encounter (Signed)
Per [employer] and had them faxed over. I have called [facility] and they are refaxing the results over. Will hold in triage until it comes through

## 2011-11-07 NOTE — Telephone Encounter (Signed)
I have received fax and made pt aware when the pcn g was located on the full allergy profile. Pt states she would like this faxed to her at [phone]. I have faxed this to her. Pt aware to call back if she did not receive it.

## 2012-02-10 NOTE — Progress Notes (Signed)
Quick Note:  Noted on day of visit via fax-results finally scanned to [facility]. ______

## 2013-10-22 ENCOUNTER — Emergency Department (HOSPITAL_COMMUNITY)
Admission: EM | Admit: 2013-10-22 | Discharge: 2013-10-22 | Disposition: A | Discharge status: Home / Self Care | Payer: Medicare Other | Attending: Emergency Medicine | Admitting: Emergency Medicine

## 2013-10-22 ENCOUNTER — Emergency Department (HOSPITAL_COMMUNITY): Payer: Medicare Other

## 2013-10-22 ENCOUNTER — Encounter (HOSPITAL_COMMUNITY): Payer: Self-pay | Admitting: Emergency Medicine

## 2013-10-22 DIAGNOSIS — S46909A Unspecified injury of unspecified muscle, fascia and tendon at shoulder and upper arm level, unspecified arm, initial encounter: Secondary | ICD-10-CM | POA: Insufficient documentation

## 2013-10-22 DIAGNOSIS — Z79899 Other long term (current) drug therapy: Secondary | ICD-10-CM | POA: Insufficient documentation

## 2013-10-22 DIAGNOSIS — W108XXA Fall (on) (from) other stairs and steps, initial encounter: Secondary | ICD-10-CM | POA: Insufficient documentation

## 2013-10-22 DIAGNOSIS — S6990XA Unspecified injury of unspecified wrist, hand and finger(s), initial encounter: Secondary | ICD-10-CM | POA: Insufficient documentation

## 2013-10-22 DIAGNOSIS — Y939 Activity, unspecified: Secondary | ICD-10-CM | POA: Insufficient documentation

## 2013-10-22 DIAGNOSIS — S0003XA Contusion of scalp, initial encounter: Secondary | ICD-10-CM | POA: Insufficient documentation

## 2013-10-22 DIAGNOSIS — Z87891 Personal history of nicotine dependence: Secondary | ICD-10-CM | POA: Insufficient documentation

## 2013-10-22 DIAGNOSIS — Z8719 Personal history of other diseases of the digestive system: Secondary | ICD-10-CM | POA: Insufficient documentation

## 2013-10-22 DIAGNOSIS — Z8742 Personal history of other diseases of the female genital tract: Secondary | ICD-10-CM | POA: Insufficient documentation

## 2013-10-22 DIAGNOSIS — W1809XA Striking against other object with subsequent fall, initial encounter: Secondary | ICD-10-CM | POA: Insufficient documentation

## 2013-10-22 DIAGNOSIS — I10 Essential (primary) hypertension: Secondary | ICD-10-CM | POA: Insufficient documentation

## 2013-10-22 DIAGNOSIS — Z853 Personal history of malignant neoplasm of breast: Secondary | ICD-10-CM | POA: Insufficient documentation

## 2013-10-22 DIAGNOSIS — S4980XA Other specified injuries of shoulder and upper arm, unspecified arm, initial encounter: Secondary | ICD-10-CM | POA: Insufficient documentation

## 2013-10-22 DIAGNOSIS — Y92009 Unspecified place in unspecified non-institutional (private) residence as the place of occurrence of the external cause: Secondary | ICD-10-CM | POA: Insufficient documentation

## 2013-10-22 DIAGNOSIS — IMO0002 Reserved for concepts with insufficient information to code with codable children: Secondary | ICD-10-CM | POA: Insufficient documentation

## 2013-10-22 DIAGNOSIS — W19XXXA Unspecified fall, initial encounter: Secondary | ICD-10-CM

## 2013-10-22 LAB — CBC WITH DIFFERENTIAL/PLATELET
Basophils Absolute: 0 10*3/uL (ref 0.0–0.1)
Basophils Relative: 0 % (ref 0–1)
Eosinophils Relative: 1 % (ref 0–5)
HCT: 39.2 % (ref 36.0–46.0)
Lymphocytes Relative: 14 % (ref 12–46)
Lymphs Abs: 1.3 10*3/uL (ref 0.7–4.0)
MCV: 90.5 fL (ref 78.0–100.0)
Monocytes Absolute: 0.5 10*3/uL (ref 0.1–1.0)
Neutro Abs: 7.1 10*3/uL (ref 1.7–7.7)
Platelets: 316 10*3/uL (ref 150–400)
RBC: 4.33 MIL/uL (ref 3.87–5.11)
RDW: 12.7 % (ref 11.5–15.5)
WBC: 9 10*3/uL (ref 4.0–10.5)

## 2013-10-22 LAB — BASIC METABOLIC PANEL
BUN: 13 mg/dL (ref 6–23)
CO2: 22 mEq/L (ref 19–32)
Chloride: 95 mEq/L — ABNORMAL LOW (ref 96–112)
Creatinine, Ser: 0.69 mg/dL (ref 0.50–1.10)
GFR calc Af Amer: >90 mL/min (ref >90)
GFR calc non Af Amer: 85 mL/min — ABNORMAL LOW (ref >90)
Glucose, Bld: 117 mg/dL — ABNORMAL HIGH (ref 70–99)
Potassium: 4.5 mEq/L (ref 3.5–5.1)
Sodium: 128 mEq/L — ABNORMAL LOW (ref 135–145)

## 2013-10-22 NOTE — ED Provider Notes (Signed)
Medical screening examination/treatment/procedure(s) were performed by non-physician practitioner and as supervising physician I was immediately available for consultation/collaboration.  EKG Interpretation   None         Verginia Toohey B. Bernette Mayers, MD 10/22/13 1131

## 2013-10-22 NOTE — ED Provider Notes (Signed)
CSN: [id number]     Arrival date & time 10/22/13  4098 History   First MD Initiated Contact with Patient 10/22/13 [phone]     Chief Complaint  Patient presents with  . Fall   (Consider location/radiation/quality/duration/timing/severity/associated sxs/prior Treatment) Patient is a 72 y.o. female presenting with fall. The history is provided by the patient, the spouse and a relative.  Fall This is a new problem. The current episode started yesterday. The problem occurs rarely. The problem has been gradually improving. Associated symptoms include arthralgias, joint swelling and myalgias. Pertinent negatives include no abdominal pain, anorexia, change in bowel habit, chest pain, chills, congestion, coughing, diaphoresis, fatigue, fever, headaches, nausea, neck pain, numbness, rash, sore throat, swollen glands, urinary symptoms, vertigo, visual change, vomiting or weakness. The symptoms are aggravated by coughing and twisting. She has tried nothing for the symptoms.    Yesterday the patient felt fine and drove herself home from a Holiday party. She woke up in the middle of the night for an unknown reason and had a witnessed fall down the stairs. Her husband found her at the bottom of the stairs unconscious which lasted approx 2 minutes, he said she at first did not make sense with her talking but slowly became more coherent. She was able to get up and go to bed. He did not bring her to the ER last night because she did not want to go. She woke up this morning and attended their grand kids school function and has sense been acting normal. Her daughter is a Advice worker and when she was told about last night she demanded that her mother needed to come get evaluated in the ED. Pt says she feels fine but has a "knot on her right head, her left hand hurts, left ribs hurt and right shoulder". She does not remember anything happening after going to bed last night. Family members say she is acting normaly  today,   Past Medical History  Diagnosis Date  . Breast cancer   . Endometriosis   . Diverticulitis   . Hypertension   . Allergic rhinitis    Past Surgical History  Procedure Laterality Date  . Breast lumpectomy  09/04/2010    w/ Sentinel Node Biopsy  . Lipoma excision  08/2002    L shoulder   . Back surgery  09/1999  . Nasal sinus surgery  1994  . Total abdominal hysterectomy  1988  . Appendectomy  1988  . Breast lumpectomy  8/81962    Fibroadenoma. Left  . Rectal fissure repair  11/1955   Family History  Problem Relation Age of Onset  . Heart disease Father   . Heart disease Mother   . Lung cancer Father     smoker  . Melanoma Daughter    History  Substance Use Topics  . Smoking status: Former Smoker -- 1.00 packs/day for 10 years    Types: Cigarettes    Quit date: 11/11/1978  . Smokeless tobacco: Not on file  . Alcohol Use: 1.2 oz/week    2 Glasses of wine per week   OB History   Grav Para Term Preterm Abortions TAB SAB Ect Mult Living                 Review of Systems  Constitutional: Negative for fever, chills, diaphoresis and fatigue.  HENT: Negative for congestion and sore throat.   Respiratory: Negative for cough.   Cardiovascular: Negative for chest pain.  Gastrointestinal: Negative for nausea, vomiting, abdominal  pain, anorexia and change in bowel habit.  Musculoskeletal: Positive for arthralgias, joint swelling and myalgias. Negative for neck pain.  Skin: Negative for rash.  Neurological: Negative for vertigo, weakness, numbness and headaches.    Allergies  Review of patient's allergies indicates no known allergies.  Home Medications   Current Outpatient Rx  Name  Route  Sig  Dispense  Refill  . buPROPion (WELLBUTRIN XL) 150 MG 24 hr tablet   Oral   Take 150 mg by mouth daily.         . Calcium-Vitamin D-Vitamin K ([facility] CALCIUM CHEWS PO)   Oral   Take by mouth.           . [facility] SALINE NOSE SPRAY NA   Nasal   Place 1 spray into the  nose 2 (two) times daily as needed. Nasal congestion.         . CYANOCOBALAMIN IJ   Injection   Inject 1 vial as directed See admin instructions. Every two months.         . CYANOCOBALAMIN PO   Oral   Take 1 tablet by mouth daily.         . ergocalciferol (VITAMIN D2) 50000 UNITS capsule   Oral   Take 50,000 Units by mouth once a week.          . fluticasone (FLONASE) 50 MCG/ACT nasal spray   Nasal   Place 2 sprays into the nose as needed.           . GuaiFENesin (MUCINEX PO)   Oral   Take 600 mg by mouth every 12 (twelve) hours as needed. Congestion.         Marland Kitchen letrozole (FEMARA) 2.5 MG tablet   Oral   Take 2.5 mg by mouth daily.           . Multiple Vitamins-Minerals ([facility] SPECTRAVITE PO)   Oral   Take by mouth daily.           . nebivolol (BYSTOLIC) 5 MG tablet   Oral   Take 5 mg by mouth daily.            BP 137/89  Pulse 80  Temp(Src) 98 F (36.7 C) (Oral)  Resp 15  Ht 5\' 1"  (1.549 m)  Wt 165 lb (74.844 kg)  BMI 31.19 kg/m2  SpO2 94% Physical Exam  Constitutional: She is oriented to person, place, and time. She appears well-developed and well-nourished. No distress.  HENT:  Head: Normocephalic. Head is with contusion. Head is without raccoon's eyes, without Battle's sign, without laceration, without right periorbital erythema and without left periorbital erythema.    Right Ear: Tympanic membrane normal.  Left Ear: Tympanic membrane normal.  Nose: Nose normal.  Mouth/Throat: Uvula is midline, oropharynx is clear and moist and mucous membranes are normal.  Eyes: Conjunctivae are normal. Pupils are equal, round, and reactive to light.  Neck: Trachea normal, normal range of motion and full passive range of motion without pain. Neck supple.  Cardiovascular: Normal rate, regular rhythm and normal pulses.   Pulmonary/Chest: Effort normal and breath sounds normal. She has no wheezes. She has no rhonchi. Chest wall is not dull to percussion. She  exhibits tenderness and bony tenderness. She exhibits no crepitus, no edema, no deformity and no retraction.    Abdominal: Soft. Normal appearance and bowel sounds are normal.  Musculoskeletal: Normal range of motion.  Neurological: She is alert and oriented to person, place, and time. She has normal  strength. No cranial nerve deficit or sensory deficit.  Skin: Skin is warm, dry and intact.  Psychiatric: She has a normal mood and affect. Her speech is normal and behavior is normal. Judgment and thought content normal. Cognition and memory are normal.    ED Course  Procedures (including critical care time) Labs Review Labs Reviewed  CBC WITH DIFFERENTIAL - Abnormal; Notable for the following:    Neutrophils Relative % 79 (*)    All other components within normal limits  BASIC METABOLIC PANEL - Abnormal; Notable for the following:    Sodium 128 (*)    Chloride 95 (*)    Glucose, Bld 117 (*)    GFR calc non Af Amer 85 (*)    All other components within normal limits   Imaging Review Dg Ribs Unilateral W/chest Left  10/22/2013   CLINICAL DATA:  Left anterior rib pain secondary to a fall today.  EXAM: LEFT RIBS AND CHEST - 3+ VIEW  COMPARISON:  Chest x-ray dated 07/10/2011  FINDINGS: Heart size and vascularity are normal and the lungs are clear except for minimal linear scarring at the left base laterally. No pneumothorax or pleural effusion. There is no appreciable rib fracture. Other osseous structures are also normal.  IMPRESSION: No acute abnormality.   Electronically Signed   By: Geanie Cooley M.D.   On: 10/22/2013 11:07   Dg Shoulder Right  10/22/2013   CLINICAL DATA:  Right shoulder pain secondary to a fall today.  EXAM: RIGHT SHOULDER - 2+ VIEW  COMPARISON:  None.  FINDINGS: There is no evidence of fracture or dislocation. There is no evidence of arthropathy or other focal bone abnormality. Soft tissues are unremarkable.  IMPRESSION: Normal exam.   Electronically Signed   By: Geanie Cooley M.D.   On: 10/22/2013 11:08   Ct Head Wo Contrast  10/22/2013   CLINICAL DATA:  Fall, loss of consciousness, head contusion.  EXAM: CT HEAD WITHOUT CONTRAST  TECHNIQUE: Contiguous axial images were obtained from the base of the skull through the vertex without intravenous contrast.  COMPARISON:  06/26/2011 sinus CT and 07/13/2004 head CT  FINDINGS: There is a moderate-sized right frontoparietal scalp hematoma. No fracture is identified. There is no evidence of acute cortical infarct, intracranial hemorrhage, mass, midline shift, or extra-axial fluid collection. Ventricles and sulci are within normal limits for age. Orbits are unremarkable. There is mild bilateral ethmoid air cell mucosal thickening. A small amount of fluid is present in the sphenoid sinus. There is also a small amount of fluid in the right maxillary sinus, incompletely imaged. Mastoid air cells are clear.  IMPRESSION: 1. Right frontoparietal scalp hematoma. No skull fracture or acute intracranial abnormality identified. 2. Paranasal sinus inflammatory mucosal disease with fluid in the right maxillary and sphenoid sinuses. Please correlate clinically for signs of acute sinusitis.   Electronically Signed   By: Sebastian Ache   On: 10/22/2013 10:49   Dg Hand Complete Left  10/22/2013   CLINICAL DATA:  Left hand pain secondary to a fall today.  EXAM: LEFT HAND - COMPLETE 3+ VIEW  COMPARISON:  None.  FINDINGS: There is no fracture or dislocation or other acute abnormality. Slight degenerative changes at the radial aspect of the wrist and at the 1st metacarpal phalangeal joint and in the interphalangeal joint of the thumb. Slight irregularity of the radial styloid could be due to old trauma.  IMPRESSION: No acute abnormalities.   Electronically Signed   By: Violeta Gelinas.D.  On: 10/22/2013 11:05    EKG Interpretation   None       MDM   1. Fall at home, initial encounter   2. Scalp hematoma, initial encounter    Patients head  CT was negative for scalp fracture or internal bleed. She does have a hematoma to her scalp. Her other xrays are negative for fracture and she is back to baseline. Dr. Bernette Mayers has seen patient as well and they are ready for discharge at this time.  ULYANA, PITONES ZO:[id number] 22-Oct-2013 09:45:51 [facility]/ED ROUTINE RECORD Normal sinus rhythm Normal ECG 4mm/s 75mm/mV 100Hz  8.0.1 12SL 241 HD CID: 3 Vent. rate 99 BPM PR interval 174 ms QRS duration 64 ms QT/QTc 350/449 ms P-R-T axes 68 75 34   72 y.o.Silverio Decamp Yager's evaluation in the Emergency Department is complete. It has been determined that no acute conditions requiring further emergency intervention are present at this time. The patient/guardian have been advised of the diagnosis and plan. We have discussed signs and symptoms that warrant return to the ED, such as changes or worsening in symptoms.  Vital signs are stable at discharge. Filed Vitals:   10/22/13 1107  BP: 137/89  Pulse: 80  Temp:   Resp: 15    Patient/guardian has voiced understanding and agreed to follow-up with the PCP or specialist.     Dorthula Matas, PA-C 10/22/13 1125  Dorthula Matas, PA-C 10/22/13 1126

## 2013-10-22 NOTE — ED Notes (Signed)
Patient transported to X-ray 

## 2013-10-22 NOTE — ED Notes (Signed)
Patient states she fell last night down steps.  Patient states she hit her head on the way down and she did lose consciousness for a brief time.  Patient states she hit her R side of head, L hand, R shoulder and L rib area.   Patient states she is having pain in all areas.  Patient states she does not remember the incident at all.   Patient daughter at bedside, daughter states she is a PA.

## 2013-10-22 NOTE — ED Notes (Signed)
Pt has pain in left ribs, bruising to left hand. Pt also reports pain in right shoulder. Pt has a knot on right top of head.

## 2013-12-02 ENCOUNTER — Telehealth: Payer: Self-pay | Admitting: *Deleted

## 2013-12-02 NOTE — Telephone Encounter (Signed)
Call received from pt's daughter, Keyosha Tiedt who is a PA in the area.  She inquired about how to obtain an appointment with Dr Jannifer Rodney for transfer of care from [facility] where pt is under care.  Leanora states her mother is currently under follow up only and needs an appointment in July 2015.  Her MD at [facility], Dr Len Childs left the practice and she would like to establish care locally ( she received radiation per [facility] ) for medical oncology with Dr Jannifer Rodney.  This note will be given Lattie Haw and/or the breast clinic navigator for follow up.

## 2014-03-07 ENCOUNTER — Telehealth: Payer: Self-pay | Admitting: *Deleted

## 2014-03-07 NOTE — Telephone Encounter (Signed)
Received information from Amy M that pt wants to come here and see Dr. Jana Hakim.  Called pt and informed her that I need records from [facility] sent to me.  Confirmed 04/21/14 appt w/ pt.  Mailed welcome packet & intake form to pt.

## 2014-03-14 ENCOUNTER — Encounter: Payer: Self-pay | Admitting: *Deleted

## 2014-03-14 NOTE — Progress Notes (Signed)
Pt came into the office and signed a medical release form.  I have faxed the form to [place], [facility] and Dr. Elta Guadeloupe Perini's office requesting records.

## 2014-04-11 ENCOUNTER — Encounter: Payer: Self-pay | Admitting: Internal Medicine

## 2014-04-20 ENCOUNTER — Other Ambulatory Visit: Payer: Self-pay | Admitting: *Deleted

## 2014-04-20 DIAGNOSIS — Z853 Personal history of malignant neoplasm of breast: Secondary | ICD-10-CM

## 2014-04-21 ENCOUNTER — Ambulatory Visit (HOSPITAL_BASED_OUTPATIENT_CLINIC_OR_DEPARTMENT_OTHER): Payer: Medicare HMO | Admitting: Oncology

## 2014-04-21 ENCOUNTER — Other Ambulatory Visit (HOSPITAL_BASED_OUTPATIENT_CLINIC_OR_DEPARTMENT_OTHER): Payer: Medicare HMO

## 2014-04-21 ENCOUNTER — Encounter: Payer: Self-pay | Admitting: Oncology

## 2014-04-21 ENCOUNTER — Encounter: Payer: Self-pay | Admitting: *Deleted

## 2014-04-21 ENCOUNTER — Ambulatory Visit: Payer: Medicare HMO

## 2014-04-21 VITALS — BP 122/83 | HR 78 | Temp 97.5°F | Resp 18 | Ht 61.0 in | Wt 162.9 lb

## 2014-04-21 DIAGNOSIS — Z853 Personal history of malignant neoplasm of breast: Secondary | ICD-10-CM

## 2014-04-21 DIAGNOSIS — C773 Secondary and unspecified malignant neoplasm of axilla and upper limb lymph nodes: Secondary | ICD-10-CM

## 2014-04-21 DIAGNOSIS — Z170 Estrogen receptor positive status [ER+]: Secondary | ICD-10-CM

## 2014-04-21 DIAGNOSIS — C50419 Malignant neoplasm of upper-outer quadrant of unspecified female breast: Secondary | ICD-10-CM

## 2014-04-21 DIAGNOSIS — C50411 Malignant neoplasm of upper-outer quadrant of right female breast: Secondary | ICD-10-CM | POA: Insufficient documentation

## 2014-04-21 DIAGNOSIS — C50911 Malignant neoplasm of unspecified site of right female breast: Secondary | ICD-10-CM

## 2014-04-21 LAB — CBC WITH DIFFERENTIAL/PLATELET
BASO%: 0.2 % (ref 0.0–2.0)
BASOS ABS: 0 10*3/uL (ref 0.0–0.1)
EOS ABS: 0.2 10*3/uL (ref 0.0–0.5)
EOS%: 2.8 % (ref 0.0–7.0)
HCT: 37.2 % (ref 34.8–46.6)
HGB: 12.6 g/dL (ref 11.6–15.9)
LYMPH%: 27.4 % (ref 14.0–49.7)
MCH: 30.4 pg (ref 25.1–34.0)
MCHC: 33.9 g/dL (ref 31.5–36.0)
MCV: 89.6 fL (ref 79.5–101.0)
MONO#: 0.5 10*3/uL (ref 0.1–0.9)
MONO%: 7.3 % (ref 0.0–14.0)
NEUT%: 62.3 % (ref 38.4–76.8)
NEUTROS ABS: 3.9 10*3/uL (ref 1.5–6.5)
PLATELETS: 301 10*3/uL (ref 145–400)
RBC: 4.15 10*6/uL (ref 3.70–5.45)
RDW: 13.4 % (ref 11.2–14.5)
WBC: 6.2 10*3/uL (ref 3.9–10.3)
lymph#: 1.7 10*3/uL (ref 0.9–3.3)

## 2014-04-21 LAB — COMPREHENSIVE METABOLIC PANEL
ALK PHOS: 98 U/L (ref 39–117)
ALT: 26 U/L (ref 0–35)
AST: 23 U/L (ref 0–37)
Albumin: 3.9 g/dL (ref 3.5–5.2)
BUN: 16 mg/dL (ref 6–23)
CO2: 23 meq/L (ref 19–32)
Calcium: 9.3 mg/dL (ref 8.4–10.5)
Chloride: 104 mEq/L (ref 96–112)
Creatinine, Ser: 0.78 mg/dL (ref 0.50–1.10)
GLUCOSE: 79 mg/dL (ref 70–99)
Potassium: 4.1 mEq/L (ref 3.5–5.3)
SODIUM: 139 meq/L (ref 135–145)
TOTAL PROTEIN: 7.2 g/dL (ref 6.0–8.3)
Total Bilirubin: 0.3 mg/dL (ref 0.2–1.2)

## 2014-04-21 NOTE — Progress Notes (Signed)
Checked in new patient with no financial issues and she has not seen the dr yet. She has appt card and [facility] packet

## 2014-04-21 NOTE — Progress Notes (Signed)
[place]  Telephone:([phone]) [phone] Fax:(336) [phone]     ID: [name] DOB: 1941-10-12  MR#: [id number]  XMD#:[id number]  Patient Care Team: Debbie Ly, MD as PCP - General (Internal Medicine) Debbie Koh MD; Ronnette Hila MD, Amy Martinique MD, Baird Lyons M.D., Junie Bame, DDS  CHIEF COMPLAINT: Stage II breast cancer followup  CURRENT TREATMENT: Letrozole  BREAST CANCER HISTORY: "[name]" had routine mammography at [facility] showing an area of abnormality in the right upper outer quadrant and an abnormal-looking right axillary lymph node. Both of these areas were biopsied 07/30/2010, with the pathology ([id number]) showing, in the breast, an invasive cancer with lobular features, estrogen receptor 97% positive, progesterone receptor negative, with an MIB-1 of 29% and no HER-2 amplification, the signals ratio being 1.00. Biopsy of the abnormal appearing lymph node was benign, but possibly discordant.  The patient then saw Dr. Clovis Sparks at [facility] and after appropriate discussion underwent right lumpectomy and sentinel lymph node sampling 09/04/2010. The pathology from this procedure ([id number]) showed an invasive lobular carcinoma, grade 2, measuring 3 cm. Margins were initially close, but additional tissue was obtained from the inferior and deep margin, showing no carcinoma. One out of 3 sentinel lymph nodes was involved by a 7 mm metastatic deposit.  An Oncotype was obtained from this procedure, and showed a recurrence score of 22, predicting a rate of distant recurrence of 14% if the patient's only systemic treatment was tamoxifen for 5 years.  [name] then saw [name] and he started her on "TAC" chemotherapy, namely cyclophosphamide, doxorubicin and docetaxel. This was initiated 10/15/2010 and was very poorly tolerated. There was a 25% dose reduction beginning with a second cycle and doxorubicin was omitted from the sixth cycle. Despite this the patient had 2  admissions for febrile neutropenia and other complications. She switched her care to [facility] and returned to [facility] to receive radiation therapy under [name]. This was concluded June 2012, and [name] was started on letrozole 04/24/2011.  Her subsequent history is as detailed below.  INTERVAL HISTORY: [name] was evaluated in the breast clinic 04/21/2014 accompanied by her husband [name]. She is establishing herself in my service now that Dr. Purcell Sparks has decided to go into research.  REVIEW OF SYSTEMS: The patient is tolerating Femara well. She has mild hot flashes. Vaginal dryness is more of an issue. She denies unusual headaches, visual changes, nausea, vomiting,  Cough, phlegm production, pleurisy, worsening shortness of breath, chest pain or pressure, or change in bowel or bladder habits. There have been no unusual fatigue, unexplained weight loss, fever, rash, or bleeding. A detailed review of systems otherwise was stable according to the patient.   PAST MEDICAL HISTORY: Past Medical History  Diagnosis Date  . Breast cancer   . Endometriosis   . Diverticulitis   . Hypertension   . Allergic rhinitis     PAST SURGICAL HISTORY: Past Surgical History  Procedure Laterality Date  . Breast lumpectomy  09/04/2010    w/ Sentinel Node Biopsy  . Lipoma excision  08/2002    L shoulder   . Back surgery  09/1999  . Nasal sinus surgery  1994  . Total abdominal hysterectomy  1988  . Appendectomy  1988  . Breast lumpectomy  8/81962    Fibroadenoma. Left  . Rectal fissure repair  11/1955    FAMILY HISTORY Family History  Problem Relation Age of Onset  . Heart disease Father   . Heart disease Mother   .  Lung cancer Father     smoker  . Melanoma Daughter   The patient's father died at the age of 72 from lung cancer in the setting of tobacco abuse. The patient's mother died from congestive heart failure at the age of 33. She had had a valve problem from childhood. The  patient's older daughter had a diagnosis of melanoma at age 30 there is a history of breast cancer on both sides of the family and a history of ovarian cancer in the patient's maternal grandmother. The patient herself however was tested at [facility] and does not carry a BRCA mutation.   GYNECOLOGIC HISTORY:  No LMP recorded. Patient has had a hysterectomy. Menarche age 4, first live birth age 60, the patient is [place] P2. She had a hysterectomy with bilateral salpingo-oophorectomy in 1987. She took hormone replacement for more than 20 years, finally stopping in 2009. 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 Her husband Debbie Saxon "[name]" Sparks is a retired Armed forces operational officer. The patient's older daughter, [name] had a history of depression and committed suicide at age 41, while working towards a Oceanographer in social work. The patient's younger daughter, [name], is a Conservation officer, historic buildings with [employer]. The patient has 2 grandchildren, both in [place]. She attends [employer] .    ADVANCED DIRECTIVES: In place   HEALTH MAINTENANCE: History  Substance Use Topics  . Smoking status: Former Smoker -- 1.00 packs/day for 10 years    Types: Cigarettes    Quit date: 11/11/1978  . Smokeless tobacco: Not on file  . Alcohol Use: 1.2 oz/week    2 Glasses of wine per week     Colonoscopy: 2008/ [facility]  PAP: Status post hysterectomy  Bone density: T score of -0.6 (2009), -1.0 (2010), -1.2 (2013) at the spine, T score 0.3  (2013) at the left femoral neck  Lipid panel:  No Known Allergies  Current Outpatient Prescriptions  Medication Sig Dispense Refill  . buPROPion (WELLBUTRIN XL) 150 MG 24 hr tablet Take 150 mg by mouth daily.      . [employer] SALINE NOSE SPRAY NA Place 1 spray into the nose 2 (two) times daily as needed. Nasal congestion.      . CYANOCOBALAMIN IJ  Inject 1 vial as directed See admin instructions. Every three months.      . CYANOCOBALAMIN PO Take 1 tablet by mouth daily.      . ergocalciferol (VITAMIN D2) 50000 UNITS capsule Take 50,000 Units by mouth once a week.       . fluticasone (FLONASE) 50 MCG/ACT nasal spray Place 2 sprays into the nose as needed.        . GuaiFENesin (MUCINEX PO) Take 600 mg by mouth every 12 (twelve) hours as needed. Congestion.      Marland Kitchen letrozole (FEMARA) 2.5 MG tablet Take 2.5 mg by mouth daily.        . Multiple Vitamins-Minerals ([employer] SPECTRAVITE PO) Take by mouth daily.        . nebivolol (BYSTOLIC) 5 MG tablet Take 5 mg by mouth daily.         No current facility-administered medications for this visit.    OBJECTIVE: Middle-aged white woman who appears stated age 36 Vitals:   04/21/14 1612  BP: 122/83  Pulse: 78  Temp: 97.5 F (36.4 C)  Resp: 18     Body mass index is  30.8 kg/(m^2).    ECOG FS:1 - Symptomatic but completely ambulatory  Ocular: Sclerae unicteric, pupils equal, round and reactive to light Ear-nose-throat: Oropharynx clear, teeth in excellent repair Lymphatic: No cervical or supraclavicular adenopathy Lungs no rales or rhonchi, good excursion bilaterally Heart regular rate and rhythm, no murmur appreciated Abd soft, nontender, positive bowel sounds MSK no focal spinal tenderness, no upper extremity lymphedema Neuro: non-focal, well-oriented, positive affect Breasts: The right breast is status post lumpectomy and radiation. The scar is lateral at about the 9:00 position. I do not palpate any suspicious masses and there are no skin or nipple changes of concern. The right axilla is benign. Left breast is unremarkable.   LAB RESULTS:  CMP     Component Value Date/Time   NA 139 04/21/2014 1559   K 4.1 04/21/2014 1559   CL 104 04/21/2014 1559   CO2 23 04/21/2014 1559   GLUCOSE 79 04/21/2014 1559   BUN 16 04/21/2014 1559   CREATININE 0.78 04/21/2014 1559   CALCIUM 9.3 04/21/2014 1559     PROT 7.2 04/21/2014 1559   ALBUMIN 3.9 04/21/2014 1559   AST 23 04/21/2014 1559   ALT 26 04/21/2014 1559   ALKPHOS 98 04/21/2014 1559   BILITOT 0.3 04/21/2014 1559   GFRNONAA 85* 10/22/2013 1015   GFRAA >90 10/22/2013 1015    I No results found for this basename: SPEP, UPEP,  kappa and lambda light chains    Lab Results  Component Value Date   WBC 6.2 04/21/2014   NEUTROABS 3.9 04/21/2014   HGB 12.6 04/21/2014   HCT 37.2 04/21/2014   MCV 89.6 04/21/2014   PLT 301 04/21/2014      Chemistry      Component Value Date/Time   NA 139 04/21/2014 1559   K 4.1 04/21/2014 1559   CL 104 04/21/2014 1559   CO2 23 04/21/2014 1559   BUN 16 04/21/2014 1559   CREATININE 0.78 04/21/2014 1559      Component Value Date/Time   CALCIUM 9.3 04/21/2014 1559   ALKPHOS 98 04/21/2014 1559   AST 23 04/21/2014 1559   ALT 26 04/21/2014 1559   BILITOT 0.3 04/21/2014 1559       No results found for this basename: LABCA2    No components found with this basename: LABCA125    No results found for this basename: INR,  in the last 168 hours  Urinalysis    Component Value Date/Time   LABSPEC 1.025 01/17/2011 1523    STUDIES: No results found.  ASSESSMENT: 73 y.o. BRCA negative [place] woman s/p right lumpectomy and sentinel lymph node sampling 09/04/2010 for a pT2 pN1a, stage IIB invasive lobular carcinoma, grade 2, estrogen receptor 97% positive, progesterone receptor and HER-2 negative  (1) Oncotype score of 22 predicted a risk of outside the breast recurrence within 10 years of 14% if the patient's only adjuvant therapy was tamoxifen for 5 years.  (2) received adjuvant cyclophosphamide, doxorubicin and docetaxel x6 between December of 2011 and April of 2012, with a 25% dose reduction beginning with cycle 2 and doxorubicin held cycle 6  (3) completed adjuvant radiation to the right breast and axilla June 2012  (4) on letrozole as of 04/24/2011  PLAN: We spent the better part of today's hour-long  appointment discussing the biology of breast cancer in general, and the specifics of the patient's tumor in particular. [name] understands she had a lobular type breast cancer, and that there is less data on this subtype then for  the more common ductal breast cancers. This means there is a little bit more "wobble" in NCCN recommendations. She had excellent adjuvant chemotherapy despite her difficulty tolerating it, and completed her local treatment with radiation. She is tolerating letrozole with minimal side effects.  Taking the Oncotype numbers at face value, in general chemotherapy reduces risk by about one third, so we could go from the 14% risk down to about 10% with her TAC chemotherapy. In addition, because aromatase inhibitors compared head-to-head against tamoxifen further reduce the risk of recurrence by approximately 3%, we can shave off that additional margin, leaving her a residual risk of recurrence in the 7% range. To put it positively, I quoted her a 93% chance of this cancer not recurring outside the breast.  There are some concerns however. Her tumor was progesterone receptor negative. Patients who have estrogen receptor positive but progesterone receptor negative tumors do not get as much benefit from antiestrogen such as those who are doubly positive. Second, in my experience lobular breast cancers tend to be late recurring. Finally Oncotype has proved not to be a reliable predictor of late recurrence (it appears that the PAM-50 score is better in that regard).   For those reasons I would prefer to continue antiestrogen therapy for 10 years in her case. She is very much in agreement with that plan.  We then discussed the effect of aromatase inhibitors on bone density. Most of the accelerated bone density loss on these agents occurs in the first year. After that bone density loss approaches age related changes. This is reflected in her T scores, quoted above. Accordingly she has already  gone through the more worrisome period and I would continue letrozole to complete 5 years before switching to tamoxifen for an additional 5 years.  She wonders if she should consider ibandronate but is concerned regarding the reports of "brittle bones" with bisphosphonates. We discussed the fact that those alarming reports have been extensively studied and there does not seem to be an increased risk of abnormal fractures due to bisphosphonates. At the same time, she appears to have only mild osteopenia and therefore I don't think there is a strong indication for bisphosphonate therapy at this point. Instead I suggested she increase her weight-bearing exercise. She is already on vitamin D supplementation under dr Perini's care.  In summary, then, the plan is to continue letrozole until June of 2017, and then start tamoxifen for an additional 5 years. I will see her again in November, then April, and then October of 2016 at which time I will start seeing her on a once a year basis. [name] has a good understanding of the overall plan. She agrees with it. She knows the goal of treatment in her case is cure. She will call with any problems that may develop before her next visit here.  Chauncey Cruel, MD   04/21/2014 7:47 PM

## 2014-04-21 NOTE — Progress Notes (Signed)
Completed chart, labs entered, added to spreadsheet and placed in Dr. Magrinat's box.  

## 2014-09-23 ENCOUNTER — Other Ambulatory Visit: Payer: Self-pay | Admitting: *Deleted

## 2014-09-23 MED ORDER — LETROZOLE 2.5 MG PO TABS: 2.5000 mg | ORAL_TABLET | Freq: Every day | ORAL | Status: DC

## 2014-10-17 ENCOUNTER — Telehealth: Payer: Self-pay | Admitting: Oncology

## 2014-10-17 NOTE — Telephone Encounter (Signed)
per pof to sch pt appt-gave pt appt time & date

## 2014-10-24 ENCOUNTER — Other Ambulatory Visit: Payer: Self-pay | Admitting: *Deleted

## 2014-10-24 DIAGNOSIS — C50911 Malignant neoplasm of unspecified site of right female breast: Secondary | ICD-10-CM

## 2014-10-25 ENCOUNTER — Other Ambulatory Visit (HOSPITAL_BASED_OUTPATIENT_CLINIC_OR_DEPARTMENT_OTHER): Payer: Medicare HMO

## 2014-10-25 DIAGNOSIS — C50811 Malignant neoplasm of overlapping sites of right female breast: Secondary | ICD-10-CM

## 2014-10-25 DIAGNOSIS — C50911 Malignant neoplasm of unspecified site of right female breast: Secondary | ICD-10-CM

## 2014-10-25 LAB — COMPREHENSIVE METABOLIC PANEL (CC13)
ALBUMIN: 3.7 g/dL (ref 3.5–5.0)
ALT: 20 U/L (ref 0–55)
ANION GAP: 10 meq/L (ref 3–11)
AST: 19 U/L (ref 5–34)
Alkaline Phosphatase: 88 U/L (ref 40–150)
BUN: 13.7 mg/dL (ref 7.0–26.0)
CALCIUM: 9.2 mg/dL (ref 8.4–10.4)
CO2: 23 meq/L (ref 22–29)
Chloride: 99 mEq/L (ref 98–109)
Creatinine: 0.8 mg/dL (ref 0.6–1.1)
EGFR: 71 mL/min/{1.73_m2} — AB (ref >90)
GLUCOSE: 87 mg/dL (ref 70–140)
POTASSIUM: 4.8 meq/L (ref 3.5–5.1)
Sodium: 132 mEq/L — ABNORMAL LOW (ref 136–145)
TOTAL PROTEIN: 6.7 g/dL (ref 6.4–8.3)
Total Bilirubin: 0.44 mg/dL (ref 0.20–1.20)

## 2014-10-25 LAB — CBC WITH DIFFERENTIAL/PLATELET
BASO%: 0.3 % (ref 0.0–2.0)
BASOS ABS: 0 10*3/uL (ref 0.0–0.1)
EOS ABS: 0.1 10*3/uL (ref 0.0–0.5)
EOS%: 2.1 % (ref 0.0–7.0)
HCT: 39.9 % (ref 34.8–46.6)
HEMOGLOBIN: 13.3 g/dL (ref 11.6–15.9)
LYMPH%: 28.1 % (ref 14.0–49.7)
MCH: 31.3 pg (ref 25.1–34.0)
MCHC: 33.4 g/dL (ref 31.5–36.0)
MCV: 93.6 fL (ref 79.5–101.0)
MONO#: 0.6 10*3/uL (ref 0.1–0.9)
MONO%: 9.9 % (ref 0.0–14.0)
NEUT%: 59.6 % (ref 38.4–76.8)
NEUTROS ABS: 3.5 10*3/uL (ref 1.5–6.5)
PLATELETS: 362 10*3/uL (ref 145–400)
RBC: 4.26 10*6/uL (ref 3.70–5.45)
RDW: 13.3 % (ref 11.2–14.5)
WBC: 5.8 10*3/uL (ref 3.9–10.3)
lymph#: 1.6 10*3/uL (ref 0.9–3.3)

## 2014-11-01 ENCOUNTER — Ambulatory Visit (HOSPITAL_BASED_OUTPATIENT_CLINIC_OR_DEPARTMENT_OTHER): Payer: Medicare HMO | Admitting: Oncology

## 2014-11-01 ENCOUNTER — Telehealth: Payer: Self-pay | Admitting: Oncology

## 2014-11-01 VITALS — BP 147/71 | HR 73 | Temp 97.5°F | Resp 18 | Ht 61.0 in | Wt 169.9 lb

## 2014-11-01 DIAGNOSIS — M899 Disorder of bone, unspecified: Secondary | ICD-10-CM

## 2014-11-01 DIAGNOSIS — Z170 Estrogen receptor positive status [ER+]: Secondary | ICD-10-CM

## 2014-11-01 DIAGNOSIS — C50911 Malignant neoplasm of unspecified site of right female breast: Secondary | ICD-10-CM

## 2014-11-01 DIAGNOSIS — C50411 Malignant neoplasm of upper-outer quadrant of right female breast: Secondary | ICD-10-CM

## 2014-11-01 NOTE — Telephone Encounter (Signed)
per pof to sch pt appt-gave pt copy of sch °

## 2014-11-01 NOTE — Progress Notes (Signed)
[name]  [phone] [phone]) [phone]     ID: [name] DOB: Mar 20, 1941  MR#: [id number]  ALP#:[id number]  Patient Care Team: Debbie Ly, Sparks as PCP - General (Internal Medicine) [name]; Debbie Hila Sparks, Debbie Martinique Sparks, [name] M.D., [name], DDS  CHIEF COMPLAINT: Sparks II breast cancer followup  CURRENT TREATMENT: Letrozole  BREAST CANCER HISTORY: From the original intake note:  "[name]" had routine mammography at [name] showing an area of abnormality in the right upper outer quadrant and an abnormal-looking right axillary lymph node. Both of these areas were biopsied 07/30/2010, with the pathology ([id number]) showing, in the breast, an invasive cancer with lobular features, estrogen receptor 97% positive, progesterone receptor negative, with an MIB-1 of 29% and no HER-2 amplification, the signals ratio being 1.00. Biopsy of the abnormal appearing lymph node was benign, but possibly discordant.  The patient then saw Dr. Clovis Sparks at [name] and after appropriate discussion underwent right lumpectomy and sentinel lymph node sampling 09/04/2010. The pathology from this procedure ([id number]) showed an invasive lobular carcinoma, grade 2, measuring 3 cm. Margins were initially close, but additional tissue was obtained from the inferior and deep margin, showing no carcinoma. One out of 3 sentinel lymph nodes was involved by a 7 mm metastatic deposit.  An Oncotype was obtained from this procedure, and showed a recurrence score of 22, predicting a rate of distant recurrence of 14% if the patient's only systemic treatment was tamoxifen for 5 years.  [name] then saw [name] and he started her on "TAC" chemotherapy, namely cyclophosphamide, doxorubicin and docetaxel. This was initiated 10/15/2010 and was very poorly tolerated. There was a 25% dose reduction beginning with a second cycle and doxorubicin was omitted from the sixth cycle.  Despite this the patient had 2 admissions for febrile neutropenia and other complications. She switched her care to [name] and returned to [name] to receive radiation therapy under [name]. This was concluded June 2012, and [name] was started on letrozole 04/24/2011.  Her subsequent history is as detailed below.  INTERVAL HISTORY: [name] returns today for follow-up of her early-Sparks breast cancer. The interval history is generally unremarkable. She is still working pretty much full time after retiring twice. 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 She is tolerating letrozole w/o significant vaginal dryness or hot flashes and no arthralgias/ myalsgias "other than what is to e expected." She obtains the medicaiton at a reasonable price  REVIEW OF SYSTEMS: She excercises several times a week, preferaly thogugh low impact aerobics. She has pain sometimes in the surgical breast. She has not palpated any changes. She sleeps "a lot" at night then takes a nap in the afternoon. She denies, forgetfulness, anxiety or depression. A detailed review of systems was otherwise noncontributory.            PAST MEDICAL HISTORY: Past Medical History  Diagnosis Date  . Breast cancer   . Endometriosis   . Diverticulitis   . Hypertension   . Allergic rhinitis     PAST SURGICAL HISTORY: Past Surgical History  Procedure Laterality Date  . Breast lumpectomy  09/04/2010    w/ Sentinel Node Biopsy  . Lipoma excision  08/2002    L shoulder   . Back surgery  09/1999  . Nasal sinus surgery  1994  . Total abdominal hysterectomy  1988  . Appendectomy  1988  . Breast lumpectomy  [phone]  Fibroadenoma. Left  . Rectal fissure repair  11/1955    FAMILY HISTORY Family History  Problem Relation Age of Onset  . Heart disease Father   . Heart disease Mother   . Lung cancer Father     smoker  . Melanoma Daughter   The patient's father died at the age of  36 from lung cancer in the setting of tobacco abuse. The patient's mother died from congestive heart failure at the age of 73. She had had a valve problem from childhood. The patient's older daughter had a diagnosis of melanoma at age 67 there is a history of breast cancer on both sides of the family and a history of ovarian cancer in the patient's maternal grandmother. The patient herself however was tested at [name] and does not carry a BRCA mutation.   GYNECOLOGIC HISTORY:  No LMP recorded. Patient has had a hysterectomy. Menarche age 64, first live birth age 63, the patient is [name] P2. She had a hysterectomy with bilateral salpingo-oophorectomy in 1987. She took hormone replacement for more than 20 years, finally stopping in 2009. 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 Her husband [name] is a retired Armed forces operational officer. The patient's older daughter, [name] had a history of depression and committed suicide at age 48, while working towards a Oceanographer in social work. The patient's younger daughter, [name], is a Conservation officer, historic buildings with prime care. The patient has 2 grandchildren, both in [place]. She attends [employer] .    ADVANCED DIRECTIVES: In place   HEALTH MAINTENANCE: History  Substance Use Topics  . Smoking status: Former Smoker -- 1.00 packs/day for 10 years    Types: Cigarettes    Quit date: 11/11/1978  . Smokeless tobacco: Not on file  . Alcohol Use: 1.2 oz/week    2 Glasses of wine per week     Colonoscopy: 2008/ [facility]  PAP: Status post hysterectomy  Bone density: T score of -0.6 (2009), -1.0 (2010), -1.2 (2013) at the spine, T score 0.3  (2013) at the left femoral neck  Lipid panel:  No Known Allergies  Current Outpatient Prescriptions  Medication Sig Dispense Refill  . buPROPion (WELLBUTRIN XL) 150 MG 24  hr tablet Take 150 mg by mouth daily.    . [employer] SALINE NOSE SPRAY NA Place 1 spray into the nose 2 (two) times daily as needed. Nasal congestion.    . CYANOCOBALAMIN IJ Inject 1 vial as directed See admin instructions. Every three months.    . CYANOCOBALAMIN PO Take 1 tablet by mouth daily.    . ergocalciferol (VITAMIN D2) 50000 UNITS capsule Take 50,000 Units by mouth once a week.     . fluticasone (FLONASE) 50 MCG/ACT nasal spray Place 2 sprays into the nose as needed.      . GuaiFENesin (MUCINEX PO) Take 600 mg by mouth every 12 (twelve) hours as needed. Congestion.    Marland Kitchen letrozole (FEMARA) 2.5 MG tablet Take 1 tablet (2.5 mg total) by mouth daily. 90 tablet 3  . Multiple Vitamins-Minerals ([employer] SPECTRAVITE PO) Take by mouth daily.      . nebivolol (BYSTOLIC) 5 MG tablet Take 5 mg by mouth daily.       No current facility-administered medications for this visit.    OBJECTIVE: Middle-aged white woman in no acute disstress Filed Vitals:   11/01/14 1502  BP: 147/71  Pulse:  73  Temp: 97.5 F (36.4 C)  Resp: 18     Body mass index is 32.12 kg/(m^2).    ECOG FS:1 - Symptomatic but completely ambulatory  Ocular: Sclerae unicteric, EOMs intact Ear-nose-throat: Oropharynx clear, teeth in good repair Lymphatic: No cervical or supraclavicular adenopathy Lungs no rales or rhonchi Heart regular rate and rhythm Abd soft, nontender, positive bowel sounds MSK no focal spinal tenderness, no upper extremity lymphedema Neuro: non-focal, well-oriented, positive affect Breasts: The right breast is status post lumpectomy and radiation. There are no suspicious findings and I cannot elicit any tenderness. The right axilla is benign. Left breast is unremarkable.   LAB RESULTS:  CMP     Component Value Date/Time   NA 132* 10/25/2014 1219   NA 139 04/21/2014 1559   K 4.8 10/25/2014 1219   K 4.1 04/21/2014 1559   CL 104 04/21/2014 1559   CO2 23 10/25/2014 1219   CO2 23 04/21/2014 1559   GLUCOSE 87  10/25/2014 1219   GLUCOSE 79 04/21/2014 1559   BUN 13.7 10/25/2014 1219   BUN 16 04/21/2014 1559   CREATININE 0.8 10/25/2014 1219   CREATININE 0.78 04/21/2014 1559   CALCIUM 9.2 10/25/2014 1219   CALCIUM 9.3 04/21/2014 1559   PROT 6.7 10/25/2014 1219   PROT 7.2 04/21/2014 1559   ALBUMIN 3.7 10/25/2014 1219   ALBUMIN 3.9 04/21/2014 1559   AST 19 10/25/2014 1219   AST 23 04/21/2014 1559   ALT 20 10/25/2014 1219   ALT 26 04/21/2014 1559   ALKPHOS 88 10/25/2014 1219   ALKPHOS 98 04/21/2014 1559   BILITOT 0.44 10/25/2014 1219   BILITOT 0.3 04/21/2014 1559   GFRNONAA 85* 10/22/2013 1015   GFRAA >90 10/22/2013 1015    I No results found for: SPEP  Lab Results  Component Value Date   WBC 5.8 10/25/2014   NEUTROABS 3.5 10/25/2014   HGB 13.3 10/25/2014   HCT 39.9 10/25/2014   MCV 93.6 10/25/2014   PLT 362 10/25/2014      Chemistry      Component Value Date/Time   NA 132* 10/25/2014 1219   NA 139 04/21/2014 1559   K 4.8 10/25/2014 1219   K 4.1 04/21/2014 1559   CL 104 04/21/2014 1559   CO2 23 10/25/2014 1219   CO2 23 04/21/2014 1559   BUN 13.7 10/25/2014 1219   BUN 16 04/21/2014 1559   CREATININE 0.8 10/25/2014 1219   CREATININE 0.78 04/21/2014 1559      Component Value Date/Time   CALCIUM 9.2 10/25/2014 1219   CALCIUM 9.3 04/21/2014 1559   ALKPHOS 88 10/25/2014 1219   ALKPHOS 98 04/21/2014 1559   AST 19 10/25/2014 1219   AST 23 04/21/2014 1559   ALT 20 10/25/2014 1219   ALT 26 04/21/2014 1559   BILITOT 0.44 10/25/2014 1219   BILITOT 0.3 04/21/2014 1559       No results found for: LABCA2  No components found for: LABCA125  No results for input(s): INR in the last 168 hours.  Urinalysis    Component Value Date/Time   LABSPEC 1.025 01/17/2011 1523    STUDIES: Repeat mammography is scheduled at [facility] in JAN 2016. Today we reviewed extensively her bone scan results from 2014 and 2015  ASSESSMENT: 73 y.o. BRCA negative [name] woman s/p right  lumpectomy and sentinel lymph node sampling 09/04/2010 for a pT2 pN1a, Sparks IIB invasive lobular carcinoma, grade 2, estrogen receptor 97% positive, progesterone receptor and HER-2 negative  (1) Oncotype score  of 22 predicted a risk of outside the breast recurrence within 10 years of 14% if the patient's only adjuvant therapy was tamoxifen for 5 years.  (2) received adjuvant cyclophosphamide, doxorubicin and docetaxel x6 between December of 2011 and April of 2012, with a 25% dose reduction beginning with cycle 2 and doxorubicin held cycle 6  (3) completed adjuvant radiation to the right breast and axilla June 2012  (4) on letrozole as of 04/24/2011, plan is to continue it for 10 years  PLAN: Annamary is doing fine from a breast cancer point of Sparks. She is tolerating letrozole well. Her main concern is the osteopenia and worries about starting ibandronate as suggested by Dr [name].  Accordingly I spent approximately 45 minutes going over bone loss issues, including normal postmenopausal bone den]sity loss and how tis is a ffected by aromatase inhibitors. We discussed oral vs parenteral bisphosphonates and also denosumab. After this discussion shje seems more favorably inclined to try ibandronate or a similar agent and will discuss this further with Dr [name] at their upcoming [place].  Otherwise she will see me again in June. The plan remains to continue antiestrogens for 10 years. She knows to call for any problems that may develop before the next visit.    Chauncey Cruel, Sparks   11/01/2014 3:07 PM

## 2015-04-07 ENCOUNTER — Telehealth: Payer: Self-pay | Admitting: Oncology

## 2015-04-07 NOTE — Telephone Encounter (Signed)
Returned Advertising account executive. Left message to confirm lab moved from 06/14 to 06/17.

## 2015-04-11 ENCOUNTER — Telehealth: Payer: Self-pay | Admitting: Oncology

## 2015-04-11 NOTE — Telephone Encounter (Signed)
Returned Advertising account executive. Patient confirmed appointment for 06/27

## 2015-04-14 ENCOUNTER — Telehealth: Payer: Self-pay | Admitting: Oncology

## 2015-04-14 ENCOUNTER — Encounter (HOSPITAL_COMMUNITY): Payer: Medicare HMO

## 2015-04-14 NOTE — Telephone Encounter (Signed)
returned call and s.w pt and confirmed appts °

## 2015-04-19 ENCOUNTER — Encounter (HOSPITAL_COMMUNITY): Payer: Self-pay

## 2015-04-19 ENCOUNTER — Other Ambulatory Visit (HOSPITAL_COMMUNITY): Payer: Self-pay | Admitting: Internal Medicine

## 2015-04-19 ENCOUNTER — Ambulatory Visit (HOSPITAL_COMMUNITY)
Admission: RE | Admit: 2015-04-19 | Discharge: 2015-04-19 | Disposition: A | Discharge status: Home / Self Care | Payer: PPO | Source: Ambulatory Visit | Attending: Internal Medicine | Admitting: Internal Medicine

## 2015-04-19 DIAGNOSIS — M810 Age-related osteoporosis without current pathological fracture: Secondary | ICD-10-CM | POA: Insufficient documentation

## 2015-04-19 MED ORDER — DENOSUMAB 60 MG/ML ~~LOC~~ SOLN
60.0000 mg | Freq: Once | SUBCUTANEOUS | Status: AC
  Administered 2015-04-19: 60 mg via SUBCUTANEOUS
  Filled 2015-04-19: qty 1

## 2015-04-19 NOTE — Discharge Instructions (Signed)
Denosumab injection What is this medicine? DENOSUMAB (den oh sue mab) slows bone breakdown. Prolia is used to treat osteoporosis in women after menopause and in men. Xgeva is used to prevent bone fractures and other bone problems caused by cancer bone metastases. Xgeva is also used to treat giant cell tumor of the bone. This medicine may be used for other purposes; ask your health care provider or pharmacist if you have questions. COMMON BRAND NAME(S): Prolia, XGEVA What should I tell my health care provider before I take this medicine? They need to know if you have any of these conditions: -dental disease -eczema -infection or history of infections -kidney disease or on dialysis -low blood calcium or vitamin D -malabsorption syndrome -scheduled to have surgery or tooth extraction -taking medicine that contains denosumab -thyroid or parathyroid disease -an unusual reaction to denosumab, other medicines, foods, dyes, or preservatives -pregnant or trying to get pregnant -breast-feeding How should I use this medicine? This medicine is for injection under the skin. It is given by a health care professional in a hospital or clinic setting. If you are getting Prolia, a special MedGuide will be given to you by the pharmacist with each prescription and refill. Be sure to read this information carefully each time. For Prolia, talk to your pediatrician regarding the use of this medicine in children. Special care may be needed. For Xgeva, talk to your pediatrician regarding the use of this medicine in children. While this drug may be prescribed for children as young as 13 years for selected conditions, precautions do apply. Overdosage: If you think you've taken too much of this medicine contact a poison control center or emergency room at once. Overdosage: If you think you have taken too much of this medicine contact a poison control center or emergency room at once. NOTE: This medicine is only for  you. Do not share this medicine with others. What if I miss a dose? It is important not to miss your dose. Call your doctor or health care professional if you are unable to keep an appointment. What may interact with this medicine? Do not take this medicine with any of the following medications: -other medicines containing denosumab This medicine may also interact with the following medications: -medicines that suppress the immune system -medicines that treat cancer -steroid medicines like prednisone or cortisone This list may not describe all possible interactions. Give your health care provider a list of all the medicines, herbs, non-prescription drugs, or dietary supplements you use. Also tell them if you smoke, drink alcohol, or use illegal drugs. Some items may interact with your medicine. What should I watch for while using this medicine? Visit your doctor or health care professional for regular checks on your progress. Your doctor or health care professional may order blood tests and other tests to see how you are doing. Call your doctor or health care professional if you get a cold or other infection while receiving this medicine. Do not treat yourself. This medicine may decrease your body's ability to fight infection. You should make sure you get enough calcium and vitamin D while you are taking this medicine, unless your doctor tells you not to. Discuss the foods you eat and the vitamins you take with your health care professional. See your dentist regularly. Brush and floss your teeth as directed. Before you have any dental work done, tell your dentist you are receiving this medicine. Do not become pregnant while taking this medicine or for 5 months after stopping   it. Women should inform their doctor if they wish to become pregnant or think they might be pregnant. There is a potential for serious side effects to an unborn child. Talk to your health care professional or pharmacist for more  information. What side effects may I notice from receiving this medicine? Side effects that you should report to your doctor or health care professional as soon as possible: -allergic reactions like skin rash, itching or hives, swelling of the face, lips, or tongue -breathing problems -chest pain -fast, irregular heartbeat -feeling faint or lightheaded, falls -fever, chills, or any other sign of infection -muscle spasms, tightening, or twitches -numbness or tingling -skin blisters or bumps, or is dry, peels, or red -slow healing or unexplained pain in the mouth or jaw -unusual bleeding or bruising Side effects that usually do not require medical attention (Report these to your doctor or health care professional if they continue or are bothersome.): -muscle pain -stomach upset, gas This list may not describe all possible side effects. Call your doctor for medical advice about side effects. You may report side effects to FDA at 1-800-FDA-1088. Where should I keep my medicine? This medicine is only given in a clinic, doctor's office, or other health care setting and will not be stored at home. NOTE: This sheet is a summary. It may not cover all possible information. If you have questions about this medicine, talk to your doctor, pharmacist, or health care provider.  2015, Elsevier/Gold Standard. (2012-04-27 12:37:47)  

## 2015-04-25 ENCOUNTER — Other Ambulatory Visit: Payer: Medicare HMO

## 2015-04-27 ENCOUNTER — Other Ambulatory Visit: Payer: Self-pay | Admitting: *Deleted

## 2015-04-27 DIAGNOSIS — C50911 Malignant neoplasm of unspecified site of right female breast: Secondary | ICD-10-CM

## 2015-04-28 ENCOUNTER — Other Ambulatory Visit (HOSPITAL_BASED_OUTPATIENT_CLINIC_OR_DEPARTMENT_OTHER): Payer: PPO

## 2015-04-28 DIAGNOSIS — C50911 Malignant neoplasm of unspecified site of right female breast: Secondary | ICD-10-CM

## 2015-04-28 DIAGNOSIS — C50811 Malignant neoplasm of overlapping sites of right female breast: Secondary | ICD-10-CM | POA: Diagnosis not present

## 2015-04-28 LAB — CBC WITH DIFFERENTIAL/PLATELET
BASO%: 0.4 % (ref 0.0–2.0)
BASOS ABS: 0 10*3/uL (ref 0.0–0.1)
EOS%: 2.3 % (ref 0.0–7.0)
Eosinophils Absolute: 0.1 10*3/uL (ref 0.0–0.5)
HCT: 37.5 % (ref 34.8–46.6)
HGB: 12.5 g/dL (ref 11.6–15.9)
LYMPH%: 29 % (ref 14.0–49.7)
MCH: 30.1 pg (ref 25.1–34.0)
MCHC: 33.3 g/dL (ref 31.5–36.0)
MCV: 90.5 fL (ref 79.5–101.0)
MONO#: 0.3 10*3/uL (ref 0.1–0.9)
MONO%: 6.8 % (ref 0.0–14.0)
NEUT#: 3.1 10*3/uL (ref 1.5–6.5)
NEUT%: 61.5 % (ref 38.4–76.8)
PLATELETS: 309 10*3/uL (ref 145–400)
RBC: 4.14 10*6/uL (ref 3.70–5.45)
RDW: 13.5 % (ref 11.2–14.5)
WBC: 5.1 10*3/uL (ref 3.9–10.3)
lymph#: 1.5 10*3/uL (ref 0.9–3.3)

## 2015-04-28 LAB — COMPREHENSIVE METABOLIC PANEL (CC13)
ALT: 23 U/L (ref 0–55)
AST: 23 U/L (ref 5–34)
Albumin: 3.6 g/dL (ref 3.5–5.0)
Alkaline Phosphatase: 82 U/L (ref 40–150)
Anion Gap: 6 mEq/L (ref 3–11)
BUN: 15 mg/dL (ref 7.0–26.0)
CALCIUM: 8.8 mg/dL (ref 8.4–10.4)
CO2: 27 mEq/L (ref 22–29)
Chloride: 105 mEq/L (ref 98–109)
Creatinine: 0.9 mg/dL (ref 0.6–1.1)
EGFR: 68 mL/min/{1.73_m2} — ABNORMAL LOW (ref >90)
Glucose: 94 mg/dl (ref 70–140)
Potassium: 4.6 mEq/L (ref 3.5–5.1)
SODIUM: 138 meq/L (ref 136–145)
TOTAL PROTEIN: 6.2 g/dL — AB (ref 6.4–8.3)
Total Bilirubin: 0.3 mg/dL (ref 0.20–1.20)

## 2015-05-02 ENCOUNTER — Ambulatory Visit: Payer: Medicare HMO | Admitting: Oncology

## 2015-05-08 ENCOUNTER — Ambulatory Visit (HOSPITAL_BASED_OUTPATIENT_CLINIC_OR_DEPARTMENT_OTHER): Payer: PPO | Admitting: Oncology

## 2015-05-08 ENCOUNTER — Telehealth: Payer: Self-pay | Admitting: Oncology

## 2015-05-08 VITALS — BP 123/67 | HR 77 | Temp 98.8°F | Resp 18 | Ht 67.0 in | Wt 165.1 lb

## 2015-05-08 DIAGNOSIS — C50811 Malignant neoplasm of overlapping sites of right female breast: Secondary | ICD-10-CM | POA: Diagnosis not present

## 2015-05-08 DIAGNOSIS — M8580 Other specified disorders of bone density and structure, unspecified site: Secondary | ICD-10-CM | POA: Diagnosis not present

## 2015-05-08 DIAGNOSIS — C50911 Malignant neoplasm of unspecified site of right female breast: Secondary | ICD-10-CM

## 2015-05-08 MED ORDER — DENOSUMAB 60 MG/ML ~~LOC~~ SOLN: 60.0000 mg | Freq: Once | SUBCUTANEOUS | Status: DC

## 2015-05-08 NOTE — Progress Notes (Signed)
[name]  Telephone:([phone]) [phone] Fax:(336) [phone]     ID: STEPHINE LANGBEHN DOB: 22-Feb-1941  MR#: [id number]  JJK#:[id number]  Patient Care Team: Crist Infante, MD as PCP - General (Internal Medicine) Arloa Koh MD; Ronnette Hila MD, Amy Martinique MD, Baird Lyons M.D., Junie Bame, DDS, Jovita Gamma MD  CHIEF COMPLAINT: Stage II breast cancer followup  CURRENT TREATMENT: Letrozole, denosumab  BREAST CANCER HISTORY: From the original intake note:  "[name]" had routine mammography at [facility] showing an area of abnormality in the right upper outer quadrant and an abnormal-looking right axillary lymph node. Both of these areas were biopsied 07/30/2010, with the pathology ([id number]) showing, in the breast, an invasive cancer with lobular features, estrogen receptor 97% positive, progesterone receptor negative, with an MIB-1 of 29% and no HER-2 amplification, the signals ratio being 1.00. Biopsy of the abnormal appearing lymph node was benign, but possibly discordant.  The patient then saw Dr. Clovis Riley at [facility] and after appropriate discussion underwent right lumpectomy and sentinel lymph node sampling 09/04/2010. The pathology from this procedure ([id number]) showed an invasive lobular carcinoma, grade 2, measuring 3 cm. Margins were initially close, but additional tissue was obtained from the inferior and deep margin, showing no carcinoma. One out of 3 sentinel lymph nodes was involved by a 7 mm metastatic deposit.  An Oncotype was obtained from this procedure, and showed a recurrence score of 22, predicting a rate of distant recurrence of 14% if the patient's only systemic treatment was tamoxifen for 5 years.  [name] then saw [name] and he started her on "TAC" chemotherapy, namely cyclophosphamide, doxorubicin and docetaxel. This was initiated 10/15/2010 and was very poorly tolerated. There was a 25% dose reduction beginning with a second cycle and doxorubicin was  omitted from the sixth cycle. Despite this the patient had 2 admissions for febrile neutropenia and other complications. She switched her care to Mercie Eon and returned to [facility] to receive radiation therapy under Arloa Koh. This was concluded June 2012, and [name] was started on letrozole 04/24/2011.  Her subsequent history is as detailed below.  INTERVAL HISTORY: [name] returns today for follow-up of her early-stage breast cancer. After her last visit here she discussed a bone density issues further with Dr. [name] and was started on denosumab/Prolia. She received the first dose 04/17/2015, at the [facility]. The injection when well, with no bony aches or pains and no symptoms suggestive of hypocalcemia. She did however receive a very large Bill and she is very upset regarding that.  REVIEW OF SYSTEMS: [name] has been having some neck problems. She had a neck MRI which showed no evidence of cancer but significant degenerative disc disease. She saw Dr. [name] regarding this and is taking 4 Aleve a day. She is not on a PPI in addition. That is helping. She is also on a good exercise program, going to the gym or water aerobics approximately 5 days a week. A detailed review of systems today was otherwise stable  PAST MEDICAL HISTORY: Past Medical History  Diagnosis Date  . Endometriosis   . Diverticulitis   . Hypertension   . Allergic rhinitis   . Breast cancer 2011    right side    PAST SURGICAL HISTORY: Past Surgical History  Procedure Laterality Date  . Lipoma excision  08/2002    L shoulder   . Back surgery  09/1999  . Nasal sinus surgery  1994  . Total abdominal hysterectomy  1988  . Appendectomy  1988  . Rectal fissure repair  11/1955  . Breast lumpectomy Right 09/04/2010    w/ Sentinel Node Biopsy  . Breast lumpectomy  8/81962    Fibroadenoma. Left  . Carpel tunnel Right last five years (04/19/15)    FAMILY HISTORY Family History    Problem Relation Age of Onset  . Heart disease Father   . Heart disease Mother   . Lung cancer Father     smoker  . Melanoma Daughter   The patient's father died at the age of 22 from lung cancer in the setting of tobacco abuse. The patient's mother died from congestive heart failure at the age of 86. She had had a valve problem from childhood. The patient's older daughter had a diagnosis of melanoma at age 7 there is a history of breast cancer on both sides of the family and a history of ovarian cancer in the patient's maternal grandmother. The patient herself however was tested at [facility] and does not carry a BRCA mutation.   GYNECOLOGIC HISTORY:  No LMP recorded. Patient has had a hysterectomy. Menarche age 44, first live birth age 37, the patient is Am[place] P2. She had a hysterectomy with bilateral salpingo-oophorectomy in 1987. She took hormone replacement for more than 20 years, finally stopping in 2009. 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 Her husband [name] "[name]" Nixon is a retired Armed forces operational officer. The patient's older daughter, [name] had a history of depression and committed suicide at age 47, while working towards a Oceanographer in social work. The patient's younger daughter, [name], is a Conservation officer, historic buildings with prime care. The patient has 2 grandchildren, both in [place]. She attends [employer] .    ADVANCED DIRECTIVES: In place   HEALTH MAINTENANCE: History  Substance Use Topics  . Smoking status: Former Smoker -- 1.00 packs/day for 10 years    Types: Cigarettes    Quit date: 11/11/1978  . Smokeless tobacco: Not on file  . Alcohol Use: 1.2 oz/week    2 Glasses of wine per week     Colonoscopy: 2008/ [facility]  PAP: Status post hysterectomy  Bone density: T score of -0.6 (2009), -1.0 (2010), -1.2 (2013) at the spine, T score  0.3  (2013) at the left femoral neck  Lipid panel:  No Known Allergies  Current Outpatient Prescriptions  Medication Sig Dispense Refill  . buPROPion (WELLBUTRIN XL) 150 MG 24 hr tablet Take 150 mg by mouth daily.    . [employer] SALINE NOSE SPRAY NA Place 1 spray into the nose 2 (two) times daily as needed. Nasal congestion.    . CYANOCOBALAMIN IJ Inject 1 vial as directed See admin instructions. Every three months.    . CYANOCOBALAMIN PO Take 1 tablet by mouth daily.    . ergocalciferol (VITAMIN D2) 50000 UNITS capsule Take 50,000 Units by mouth once a week.     . fluticasone (FLONASE) 50 MCG/ACT nasal spray Place 2 sprays into the nose as needed.      . GuaiFENesin (MUCINEX PO) Take 600 mg by mouth every 12 (twelve) hours as needed. Congestion.    Marland Kitchen letrozole (FEMARA) 2.5 MG tablet Take 1 tablet (2.5 mg total) by mouth daily. 90 tablet 3  . Multiple Vitamins-Minerals ([employer]S SPECTRAVITE PO) Take by mouth daily.      . naproxen sodium (ALEVE) 220 MG tablet Take 2  tablets (440 mg total) by mouth 2 (two) times daily with a meal.    . nebivolol (BYSTOLIC) 5 MG tablet Take 5 mg by mouth daily.      Marland Kitchen omeprazole (PRILOSEC) 20 MG capsule Take 1 capsule (20 mg total) by mouth daily.     No current facility-administered medications for this visit.    OBJECTIVE: Middle-aged white woman who appears stated age 40 Vitals:   05/08/15 1010  BP: 123/67  Pulse: 77  Temp: 98.8 F (37.1 C)  Resp: 18     Body mass index is 25.85 kg/(m^2).    ECOG FS:1 - Symptomatic but completely ambulatory  Sclerae unicteric, pupils round and equal Oropharynx clear and moist-- no thrush or other lesions No cervical or supraclavicular adenopathy Lungs no rales or rhonchi Heart regular rate and rhythm Abd soft, nontender, positive bowel sounds MSK no focal spinal tenderness, no upper extremity lymphedema Neuro: nonfocal, well oriented, appropriate affect Breasts: The right breast is status post lumpectomy and  radiation. There is no evidence of local recurrence. The right axilla is benign. The left breast is unremarkable.   LAB RESULTS:  CMP     Component Value Date/Time   NA 138 04/28/2015 1312   NA 139 04/21/2014 1559   K 4.6 04/28/2015 1312   K 4.1 04/21/2014 1559   CL 104 04/21/2014 1559   CO2 27 04/28/2015 1312   CO2 23 04/21/2014 1559   GLUCOSE 94 04/28/2015 1312   GLUCOSE 79 04/21/2014 1559   BUN 15.0 04/28/2015 1312   BUN 16 04/21/2014 1559   CREATININE 0.9 04/28/2015 1312   CREATININE 0.78 04/21/2014 1559   CALCIUM 8.8 04/28/2015 1312   CALCIUM 9.3 04/21/2014 1559   PROT 6.2* 04/28/2015 1312   PROT 7.2 04/21/2014 1559   ALBUMIN 3.6 04/28/2015 1312   ALBUMIN 3.9 04/21/2014 1559   AST 23 04/28/2015 1312   AST 23 04/21/2014 1559   ALT 23 04/28/2015 1312   ALT 26 04/21/2014 1559   ALKPHOS 82 04/28/2015 1312   ALKPHOS 98 04/21/2014 1559   BILITOT 0.30 04/28/2015 1312   BILITOT 0.3 04/21/2014 1559   GFRNONAA 85* 10/22/2013 1015   GFRAA >90 10/22/2013 1015    I No results found for: SPEP  Lab Results  Component Value Date   WBC 5.1 04/28/2015   NEUTROABS 3.1 04/28/2015   HGB 12.5 04/28/2015   HCT 37.5 04/28/2015   MCV 90.5 04/28/2015   PLT 309 04/28/2015      Chemistry      Component Value Date/Time   NA 138 04/28/2015 1312   NA 139 04/21/2014 1559   K 4.6 04/28/2015 1312   K 4.1 04/21/2014 1559   CL 104 04/21/2014 1559   CO2 27 04/28/2015 1312   CO2 23 04/21/2014 1559   BUN 15.0 04/28/2015 1312   BUN 16 04/21/2014 1559   CREATININE 0.9 04/28/2015 1312   CREATININE 0.78 04/21/2014 1559      Component Value Date/Time   CALCIUM 8.8 04/28/2015 1312   CALCIUM 9.3 04/21/2014 1559   ALKPHOS 82 04/28/2015 1312   ALKPHOS 98 04/21/2014 1559   AST 23 04/28/2015 1312   AST 23 04/21/2014 1559   ALT 23 04/28/2015 1312   ALT 26 04/21/2014 1559   BILITOT 0.30 04/28/2015 1312   BILITOT 0.3 04/21/2014 1559       No results found for: LABCA2  No  components found for: LABCA125  No results for input(s): INR in the last 168  hours.  Urinalysis    Component Value Date/Time   LABSPEC 1.025 01/17/2011 1523   PHURINE 6.0 01/17/2011 1523   HGBUR Trace 01/17/2011 1523   BILIRUBINUR Negative 01/17/2011 1523   KETONESUR Negative 01/17/2011 1523   PROTEINUR < 30 01/17/2011 1523   NITRITE Positive 01/17/2011 1523   LEUKOCYTESUR Moderate 01/17/2011 1523    STUDIES: INDICATION: NECK PAIN: NECK PAIN.  Right-sided neck pain radiating down into the right shoulder area for the past 3 months.  COMPARISON: Radiographs performed on 03/23/2015.  TECHNIQUE: Multiplanar, multisequence MR imaging obtained through the cervical spine without intravenous administration of contrast on 04/03/2015 2:30 PM.   CONTRAST: None.  FINDINGS: #  Osseous structures: Endplate degenerative changes with osteophytes noted at C4-C5, C5-C6 and C6-C7. No evidence of an acute fracture. #  Alignment: There is a minimal degenerative anterolisthesis at C4-C5. #  Cervicomedullary junction: Unremarkable. No cerebellar tonsillar ectopia. #  Spinal cord: No intrinsic spinal cord abnormality.  #  C2-C3: Facet hypertrophy on the right. There is mild uncovertebral disease on the right. This produces mild right neural foraminal stenosis. The left neural foramen and spinal canal are patent. #  C3-C4: Mild facet hypertrophy on the left. Mild uncovertebral disease on the right. Spinal canal is patent. Moderate left neural foraminal stenosis. Mild right neural foraminal compromise. #  C4-C5: Disc osteophytic complex with loss of disc height effacing the ventral thecal sac. Moderate left and mild right neural foraminal compromise. #  C5-C6: Disc osteophytic complex with loss of disc height. There is effacement of the CSF space around the spinal cord. Severe left and mild right neural foraminal stenosis. #  C6-C7: Disc osteophytic complex with loss of disc height effacing the ventral  thecal sac. Moderate right and mild left neural foraminal stenosis. #  C7-T1: No significant disc herniation, spinal canal, or neuroforaminal compromise.  #  Paraspinal tissues: Unremarkable  #  Additional comments: None.   IMPRESSION: 1.  Degenerative disc disease and facet arthrosis as described above. This is most notable for moderate spinal canal narrowing at C5-C6 with severe left and mild right neural foraminal stenosis. 2.  Moderate left neural foraminal and mild right neural foraminal compromise at C3-C4. 3.  Mild spinal canal and moderate left and mild right neural foraminal stenosis at C4-C5. 4.  Mild spinal canal and moderate right and mild left neural foraminal stenosis at C6-C7.  Electronically Signed by Bryon Lions  Tue Apr 09, 2005  3:17 PM EDT Patient: ELIZBETH, POSA   [id number] ______________________________________________________________________________  Rad Rpt:  Final    04/09/2005 15:17  Req# [id number]     Acct#  [id number] [id number]                                                   Verified    Right knee AP lateral and sunrise patellar views with weight-bearing  Comparison: None.  Findings and impression: There is minimal medial joint space narrowing. No significant osteophytosis or subchondral sclerosis is noted. No significant joint effusion. These findings suggest early degenerative changes with cartilage loss.  Report Release Date/Time: [id number] Approving MD: Clarise Cruz  MD  ATTENDING MD: Loura Back MD: Mora Appl               ORDER REASON: RT KNEE  ASSESSMENT: 73 y.o. BRCA negative [place] woman s/p right lumpectomy and sentinel lymph node sampling 09/04/2010 for a pT2 pN1a, stage IIB invasive lobular carcinoma, grade 2, estrogen receptor 97% positive, progesterone receptor and HER-2 negative  (1) Oncotype score of 22 predicted a risk of outside the  breast recurrence within 10 years of 14% if the patient's only adjuvant therapy was tamoxifen for 5 years.  (2) received adjuvant cyclophosphamide, doxorubicin and docetaxel x6 between December of 2011 and April of 2012, with a 25% dose reduction beginning with cycle 2 and doxorubicin held cycle 6  (3) completed adjuvant radiation to the right breast and axilla June 2012  (4) on letrozole as of 04/24/2011, plan is to continue it for 10 years  (a) DEXA scan 10/05/2014 showed osteopenia with a T score of -1.5  PLAN: Taelyr continues to do well on letrozole. We discussed the data presented in May demonstrating further benefit by continuing letrozole for 10 years as opposed to stopping at 5 years. That was her planning any case so she is very pleased to get corroboration.  She is angry because of the bill she has received regarding her first denosumab dose. Apparently the problem is that it was ordered through the hospital. There are additional hospital charges as a result. I have asked her to meet with our financial advisor today to see if she can clear up whatever the questions are. We give Prolia here all the time and we know how to bill for it so she would prefer to receive it here. I went ahead and set her up for the next dose early December. I also suggested she take additional calcium in the form of Tums day 1 and 2 after each dose. Of course we will be checking a calcium level before treating her.  I offered her participation in our survivorship program but she really is not motivated.  She is mentoring patient M.H., who is transferring to my care, and when I see that patient in a month [place] will update me on the cost issue  Otherwise she will see me again in one year. She will receive her third dose of denosumab at that time. Her bone densities are followed yearly through Dr. Silvestre Mesi office. Beena knows to call for any problems that may develop before her next visit here   Chauncey Cruel,  MD   05/08/2015 10:45 AM

## 2015-05-08 NOTE — Telephone Encounter (Signed)
Gave avs & calendar for December & June.

## 2015-10-17 ENCOUNTER — Other Ambulatory Visit: Payer: Self-pay | Admitting: Oncology

## 2015-10-17 ENCOUNTER — Ambulatory Visit (HOSPITAL_BASED_OUTPATIENT_CLINIC_OR_DEPARTMENT_OTHER): Payer: PPO

## 2015-10-17 ENCOUNTER — Other Ambulatory Visit (HOSPITAL_BASED_OUTPATIENT_CLINIC_OR_DEPARTMENT_OTHER): Payer: PPO

## 2015-10-17 VITALS — BP 136/82 | HR 73 | Temp 98.2°F

## 2015-10-17 DIAGNOSIS — M8580 Other specified disorders of bone density and structure, unspecified site: Secondary | ICD-10-CM | POA: Diagnosis not present

## 2015-10-17 DIAGNOSIS — C50811 Malignant neoplasm of overlapping sites of right female breast: Secondary | ICD-10-CM | POA: Diagnosis not present

## 2015-10-17 DIAGNOSIS — C50911 Malignant neoplasm of unspecified site of right female breast: Secondary | ICD-10-CM

## 2015-10-17 LAB — COMPREHENSIVE METABOLIC PANEL
ALT: 22 U/L (ref 0–55)
AST: 24 U/L (ref 5–34)
Albumin: 3.8 g/dL (ref 3.5–5.0)
Alkaline Phosphatase: 73 U/L (ref 40–150)
Anion Gap: 8 mEq/L (ref 3–11)
BILIRUBIN TOTAL: 0.44 mg/dL (ref 0.20–1.20)
BUN: 12.6 mg/dL (ref 7.0–26.0)
CO2: 28 meq/L (ref 22–29)
Calcium: 9.9 mg/dL (ref 8.4–10.4)
Chloride: 101 mEq/L (ref 98–109)
Creatinine: 0.8 mg/dL (ref 0.6–1.1)
EGFR: 70 mL/min/{1.73_m2} — AB (ref >90)
Glucose: 83 mg/dl (ref 70–140)
Potassium: 4.4 mEq/L (ref 3.5–5.1)
SODIUM: 137 meq/L (ref 136–145)
Total Protein: 7 g/dL (ref 6.4–8.3)

## 2015-10-17 LAB — CBC WITH DIFFERENTIAL/PLATELET
BASO%: 0.3 % (ref 0.0–2.0)
Basophils Absolute: 0 10*3/uL (ref 0.0–0.1)
EOS ABS: 0.1 10*3/uL (ref 0.0–0.5)
EOS%: 1.5 % (ref 0.0–7.0)
HCT: 38.5 % (ref 34.8–46.6)
HGB: 12.8 g/dL (ref 11.6–15.9)
LYMPH%: 28.5 % (ref 14.0–49.7)
MCH: 30.3 pg (ref 25.1–34.0)
MCHC: 33.2 g/dL (ref 31.5–36.0)
MCV: 91.2 fL (ref 79.5–101.0)
MONO#: 0.4 10*3/uL (ref 0.1–0.9)
MONO%: 7 % (ref 0.0–14.0)
NEUT#: 3.4 10*3/uL (ref 1.5–6.5)
NEUT%: 62.7 % (ref 38.4–76.8)
Platelets: 325 10*3/uL (ref 145–400)
RBC: 4.22 10*6/uL (ref 3.70–5.45)
RDW: 13.5 % (ref 11.2–14.5)
WBC: 5.4 10*3/uL (ref 3.9–10.3)
lymph#: 1.5 10*3/uL (ref 0.9–3.3)

## 2015-10-17 MED ORDER — DENOSUMAB 60 MG/ML ~~LOC~~ SOLN
60.0000 mg | Freq: Once | SUBCUTANEOUS | Status: AC
  Administered 2015-10-17: 60 mg via SUBCUTANEOUS
  Filled 2015-10-17: qty 1

## 2015-10-17 NOTE — Patient Instructions (Signed)
Denosumab injection  What is this medicine?  DENOSUMAB (den oh sue mab) slows bone breakdown. Prolia is used to treat osteoporosis in women after menopause and in men. Xgeva is used to prevent bone fractures and other bone problems caused by cancer bone metastases. Xgeva is also used to treat giant cell tumor of the bone.  This medicine may be used for other purposes; ask your health care provider or pharmacist if you have questions.  What should I tell my health care provider before I take this medicine?  They need to know if you have any of these conditions:  -dental disease  -eczema  -infection or history of infections  -kidney disease or on dialysis  -low blood calcium or vitamin D  -malabsorption syndrome  -scheduled to have surgery or tooth extraction  -taking medicine that contains denosumab  -thyroid or parathyroid disease  -an unusual reaction to denosumab, other medicines, foods, dyes, or preservatives  -pregnant or trying to get pregnant  -breast-feeding  How should I use this medicine?  This medicine is for injection under the skin. It is given by a health care professional in a hospital or clinic setting.  If you are getting Prolia, a special MedGuide will be given to you by the pharmacist with each prescription and refill. Be sure to read this information carefully each time.  For Prolia, talk to your pediatrician regarding the use of this medicine in children. Special care may be needed. For Xgeva, talk to your pediatrician regarding the use of this medicine in children. While this drug may be prescribed for children as young as 13 years for selected conditions, precautions do apply.  Overdosage: If you think you have taken too much of this medicine contact a poison control center or emergency room at once.  NOTE: This medicine is only for you. Do not share this medicine with others.  What if I miss a dose?  It is important not to miss your dose. Call your doctor or health care professional if you are  unable to keep an appointment.  What may interact with this medicine?  Do not take this medicine with any of the following medications:  -other medicines containing denosumab  This medicine may also interact with the following medications:  -medicines that suppress the immune system  -medicines that treat cancer  -steroid medicines like prednisone or cortisone  This list may not describe all possible interactions. Give your health care provider a list of all the medicines, herbs, non-prescription drugs, or dietary supplements you use. Also tell them if you smoke, drink alcohol, or use illegal drugs. Some items may interact with your medicine.  What should I watch for while using this medicine?  Visit your doctor or health care professional for regular checks on your progress. Your doctor or health care professional may order blood tests and other tests to see how you are doing.  Call your doctor or health care professional if you get a cold or other infection while receiving this medicine. Do not treat yourself. This medicine may decrease your body's ability to fight infection.  You should make sure you get enough calcium and vitamin D while you are taking this medicine, unless your doctor tells you not to. Discuss the foods you eat and the vitamins you take with your health care professional.  See your dentist regularly. Brush and floss your teeth as directed. Before you have any dental work done, tell your dentist you are receiving this medicine.  Do   not become pregnant while taking this medicine or for 5 months after stopping it. Women should inform their doctor if they wish to become pregnant or think they might be pregnant. There is a potential for serious side effects to an unborn child. Talk to your health care professional or pharmacist for more information.  What side effects may I notice from receiving this medicine?  Side effects that you should report to your doctor or health care professional as soon as  possible:  -allergic reactions like skin rash, itching or hives, swelling of the face, lips, or tongue  -breathing problems  -chest pain  -fast, irregular heartbeat  -feeling faint or lightheaded, falls  -fever, chills, or any other sign of infection  -muscle spasms, tightening, or twitches  -numbness or tingling  -skin blisters or bumps, or is dry, peels, or red  -slow healing or unexplained pain in the mouth or jaw  -unusual bleeding or bruising  Side effects that usually do not require medical attention (Report these to your doctor or health care professional if they continue or are bothersome.):  -muscle pain  -stomach upset, gas  This list may not describe all possible side effects. Call your doctor for medical advice about side effects. You may report side effects to FDA at 1-800-FDA-1088.  Where should I keep my medicine?  This medicine is only given in a clinic, doctor's office, or other health care setting and will not be stored at home.  NOTE: This sheet is a summary. It may not cover all possible information. If you have questions about this medicine, talk to your doctor, pharmacist, or health care provider.      2016, Elsevier/Gold Standard. (2012-04-27 12:37:47)

## 2015-11-16 DIAGNOSIS — H25013 Cortical age-related cataract, bilateral: Secondary | ICD-10-CM | POA: Diagnosis not present

## 2015-11-16 DIAGNOSIS — H2513 Age-related nuclear cataract, bilateral: Secondary | ICD-10-CM | POA: Diagnosis not present

## 2015-11-22 DIAGNOSIS — M5481 Occipital neuralgia: Secondary | ICD-10-CM | POA: Diagnosis not present

## 2015-11-29 DIAGNOSIS — M71341 Other bursal cyst, right hand: Secondary | ICD-10-CM | POA: Diagnosis not present

## 2015-11-29 DIAGNOSIS — M71342 Other bursal cyst, left hand: Secondary | ICD-10-CM | POA: Diagnosis not present

## 2015-11-29 DIAGNOSIS — R2231 Localized swelling, mass and lump, right upper limb: Secondary | ICD-10-CM | POA: Diagnosis not present

## 2015-11-29 DIAGNOSIS — M65341 Trigger finger, right ring finger: Secondary | ICD-10-CM | POA: Diagnosis not present

## 2015-11-29 DIAGNOSIS — M67843 Other specified disorders of tendon, right hand: Secondary | ICD-10-CM | POA: Diagnosis not present

## 2015-12-04 DIAGNOSIS — E559 Vitamin D deficiency, unspecified: Secondary | ICD-10-CM | POA: Diagnosis not present

## 2015-12-04 DIAGNOSIS — Z0000 Encounter for general adult medical examination without abnormal findings: Secondary | ICD-10-CM | POA: Diagnosis not present

## 2015-12-04 DIAGNOSIS — I10 Essential (primary) hypertension: Secondary | ICD-10-CM | POA: Diagnosis not present

## 2015-12-06 DIAGNOSIS — Z08 Encounter for follow-up examination after completed treatment for malignant neoplasm: Secondary | ICD-10-CM | POA: Diagnosis not present

## 2015-12-06 DIAGNOSIS — M5481 Occipital neuralgia: Secondary | ICD-10-CM | POA: Diagnosis not present

## 2015-12-06 DIAGNOSIS — Z923 Personal history of irradiation: Secondary | ICD-10-CM | POA: Diagnosis not present

## 2015-12-06 DIAGNOSIS — Z79899 Other long term (current) drug therapy: Secondary | ICD-10-CM | POA: Diagnosis not present

## 2015-12-06 DIAGNOSIS — Z9221 Personal history of antineoplastic chemotherapy: Secondary | ICD-10-CM | POA: Diagnosis not present

## 2015-12-06 DIAGNOSIS — I10 Essential (primary) hypertension: Secondary | ICD-10-CM | POA: Diagnosis not present

## 2015-12-06 DIAGNOSIS — Z853 Personal history of malignant neoplasm of breast: Secondary | ICD-10-CM | POA: Diagnosis not present

## 2015-12-06 DIAGNOSIS — C50911 Malignant neoplasm of unspecified site of right female breast: Secondary | ICD-10-CM | POA: Diagnosis not present

## 2015-12-06 DIAGNOSIS — Z780 Asymptomatic menopausal state: Secondary | ICD-10-CM | POA: Diagnosis not present

## 2015-12-06 DIAGNOSIS — R928 Other abnormal and inconclusive findings on diagnostic imaging of breast: Secondary | ICD-10-CM | POA: Diagnosis not present

## 2015-12-06 DIAGNOSIS — Z87891 Personal history of nicotine dependence: Secondary | ICD-10-CM | POA: Diagnosis not present

## 2015-12-07 ENCOUNTER — Other Ambulatory Visit: Payer: Self-pay | Admitting: Oncology

## 2015-12-08 NOTE — Telephone Encounter (Signed)
Chart reviewed.

## 2015-12-11 DIAGNOSIS — R040 Epistaxis: Secondary | ICD-10-CM | POA: Diagnosis not present

## 2015-12-11 DIAGNOSIS — J45901 Unspecified asthma with (acute) exacerbation: Secondary | ICD-10-CM | POA: Diagnosis not present

## 2015-12-11 DIAGNOSIS — E538 Deficiency of other specified B group vitamins: Secondary | ICD-10-CM | POA: Diagnosis not present

## 2015-12-11 DIAGNOSIS — M859 Disorder of bone density and structure, unspecified: Secondary | ICD-10-CM | POA: Diagnosis not present

## 2015-12-11 DIAGNOSIS — D72819 Decreased white blood cell count, unspecified: Secondary | ICD-10-CM | POA: Diagnosis not present

## 2015-12-11 DIAGNOSIS — E559 Vitamin D deficiency, unspecified: Secondary | ICD-10-CM | POA: Diagnosis not present

## 2015-12-11 DIAGNOSIS — E668 Other obesity: Secondary | ICD-10-CM | POA: Diagnosis not present

## 2015-12-11 DIAGNOSIS — Z6831 Body mass index (BMI) 31.0-31.9, adult: Secondary | ICD-10-CM | POA: Diagnosis not present

## 2015-12-11 DIAGNOSIS — Z1389 Encounter for screening for other disorder: Secondary | ICD-10-CM | POA: Diagnosis not present

## 2015-12-11 DIAGNOSIS — R413 Other amnesia: Secondary | ICD-10-CM | POA: Diagnosis not present

## 2015-12-11 DIAGNOSIS — C50919 Malignant neoplasm of unspecified site of unspecified female breast: Secondary | ICD-10-CM | POA: Diagnosis not present

## 2015-12-11 DIAGNOSIS — M542 Cervicalgia: Secondary | ICD-10-CM | POA: Diagnosis not present

## 2015-12-11 DIAGNOSIS — Z0000 Encounter for general adult medical examination without abnormal findings: Secondary | ICD-10-CM | POA: Diagnosis not present

## 2015-12-12 DIAGNOSIS — Z1212 Encounter for screening for malignant neoplasm of rectum: Secondary | ICD-10-CM | POA: Diagnosis not present

## 2016-01-09 DIAGNOSIS — M5481 Occipital neuralgia: Secondary | ICD-10-CM | POA: Diagnosis not present

## 2016-01-10 DIAGNOSIS — Z23 Encounter for immunization: Secondary | ICD-10-CM | POA: Diagnosis not present

## 2016-02-28 DIAGNOSIS — Z5181 Encounter for therapeutic drug level monitoring: Secondary | ICD-10-CM | POA: Diagnosis not present

## 2016-02-28 DIAGNOSIS — Z79899 Other long term (current) drug therapy: Secondary | ICD-10-CM | POA: Diagnosis not present

## 2016-02-28 DIAGNOSIS — M47812 Spondylosis without myelopathy or radiculopathy, cervical region: Secondary | ICD-10-CM | POA: Diagnosis not present

## 2016-02-28 DIAGNOSIS — M5481 Occipital neuralgia: Secondary | ICD-10-CM | POA: Diagnosis not present

## 2016-02-28 DIAGNOSIS — G894 Chronic pain syndrome: Secondary | ICD-10-CM | POA: Diagnosis not present

## 2016-03-17 DIAGNOSIS — R300 Dysuria: Secondary | ICD-10-CM | POA: Diagnosis not present

## 2016-03-17 DIAGNOSIS — R350 Frequency of micturition: Secondary | ICD-10-CM | POA: Diagnosis not present

## 2016-03-17 DIAGNOSIS — N3001 Acute cystitis with hematuria: Secondary | ICD-10-CM | POA: Diagnosis not present

## 2016-03-18 DIAGNOSIS — Z6831 Body mass index (BMI) 31.0-31.9, adult: Secondary | ICD-10-CM | POA: Diagnosis not present

## 2016-03-18 DIAGNOSIS — N390 Urinary tract infection, site not specified: Secondary | ICD-10-CM | POA: Diagnosis not present

## 2016-03-18 DIAGNOSIS — R197 Diarrhea, unspecified: Secondary | ICD-10-CM | POA: Diagnosis not present

## 2016-03-18 DIAGNOSIS — E538 Deficiency of other specified B group vitamins: Secondary | ICD-10-CM | POA: Diagnosis not present

## 2016-03-20 DIAGNOSIS — R197 Diarrhea, unspecified: Secondary | ICD-10-CM | POA: Diagnosis not present

## 2016-03-26 DIAGNOSIS — R197 Diarrhea, unspecified: Secondary | ICD-10-CM | POA: Diagnosis not present

## 2016-03-26 DIAGNOSIS — Z6831 Body mass index (BMI) 31.0-31.9, adult: Secondary | ICD-10-CM | POA: Diagnosis not present

## 2016-03-26 DIAGNOSIS — R0609 Other forms of dyspnea: Secondary | ICD-10-CM | POA: Diagnosis not present

## 2016-03-28 DIAGNOSIS — H2513 Age-related nuclear cataract, bilateral: Secondary | ICD-10-CM | POA: Diagnosis not present

## 2016-03-28 DIAGNOSIS — H5213 Myopia, bilateral: Secondary | ICD-10-CM | POA: Diagnosis not present

## 2016-03-28 DIAGNOSIS — H25013 Cortical age-related cataract, bilateral: Secondary | ICD-10-CM | POA: Diagnosis not present

## 2016-04-01 ENCOUNTER — Emergency Department (HOSPITAL_COMMUNITY): Payer: PPO

## 2016-04-01 ENCOUNTER — Telehealth: Payer: Self-pay

## 2016-04-01 ENCOUNTER — Encounter (HOSPITAL_COMMUNITY): Payer: Self-pay

## 2016-04-01 ENCOUNTER — Emergency Department (HOSPITAL_COMMUNITY)
Admission: EM | Admit: 2016-04-01 | Discharge: 2016-04-01 | Disposition: A | Discharge status: Home / Self Care | Payer: PPO | Attending: Emergency Medicine | Admitting: Emergency Medicine

## 2016-04-01 DIAGNOSIS — K922 Gastrointestinal hemorrhage, unspecified: Secondary | ICD-10-CM | POA: Diagnosis not present

## 2016-04-01 DIAGNOSIS — Z87891 Personal history of nicotine dependence: Secondary | ICD-10-CM | POA: Insufficient documentation

## 2016-04-01 DIAGNOSIS — R1011 Right upper quadrant pain: Secondary | ICD-10-CM | POA: Diagnosis not present

## 2016-04-01 DIAGNOSIS — Z79899 Other long term (current) drug therapy: Secondary | ICD-10-CM | POA: Diagnosis not present

## 2016-04-01 DIAGNOSIS — K529 Noninfective gastroenteritis and colitis, unspecified: Secondary | ICD-10-CM | POA: Diagnosis not present

## 2016-04-01 DIAGNOSIS — Z853 Personal history of malignant neoplasm of breast: Secondary | ICD-10-CM | POA: Diagnosis not present

## 2016-04-01 DIAGNOSIS — I10 Essential (primary) hypertension: Secondary | ICD-10-CM | POA: Diagnosis not present

## 2016-04-01 DIAGNOSIS — K5730 Diverticulosis of large intestine without perforation or abscess without bleeding: Secondary | ICD-10-CM | POA: Diagnosis not present

## 2016-04-01 LAB — COMPREHENSIVE METABOLIC PANEL
ALT: 29 U/L (ref 14–54)
AST: 28 U/L (ref 15–41)
Albumin: 3.7 g/dL (ref 3.5–5.0)
Alkaline Phosphatase: 57 U/L (ref 38–126)
Anion gap: 8 (ref 5–15)
BUN: 13 mg/dL (ref 6–20)
CO2: 22 mmol/L (ref 22–32)
Calcium: 8.2 mg/dL — ABNORMAL LOW (ref 8.9–10.3)
Chloride: 99 mmol/L — ABNORMAL LOW (ref 101–111)
Creatinine, Ser: 0.71 mg/dL (ref 0.44–1.00)
GFR calc Af Amer: >60 mL/min (ref >60)
GFR calc non Af Amer: >60 mL/min (ref >60)
Glucose, Bld: 105 mg/dL — ABNORMAL HIGH (ref 65–99)
Potassium: 5 mmol/L (ref 3.5–5.1)
Sodium: 129 mmol/L — ABNORMAL LOW (ref 135–145)
Total Bilirubin: 1 mg/dL (ref 0.3–1.2)
Total Protein: 6.5 g/dL (ref 6.5–8.1)

## 2016-04-01 LAB — URINALYSIS, ROUTINE W REFLEX MICROSCOPIC
Glucose, UA: NEGATIVE mg/dL
Hgb urine dipstick: NEGATIVE
Ketones, ur: NEGATIVE mg/dL
Nitrite: NEGATIVE
Protein, ur: 30 mg/dL — AB
Specific Gravity, Urine: 1.022 (ref 1.005–1.030)
pH: 6.5 (ref 5.0–8.0)

## 2016-04-01 LAB — CBC WITH DIFFERENTIAL/PLATELET
Basophils Absolute: 0 10*3/uL (ref 0.0–0.1)
Basophils Relative: 0 %
Eosinophils Absolute: 0.1 10*3/uL (ref 0.0–0.7)
Eosinophils Relative: 1 %
HCT: 37.3 % (ref 36.0–46.0)
Hemoglobin: 13.1 g/dL (ref 12.0–15.0)
Lymphocytes Relative: 11 %
Lymphs Abs: 1.2 10*3/uL (ref 0.7–4.0)
MCH: 30.5 pg (ref 26.0–34.0)
MCHC: 35.1 g/dL (ref 30.0–36.0)
MCV: 86.9 fL (ref 78.0–100.0)
Monocytes Absolute: 0.7 10*3/uL (ref 0.1–1.0)
Monocytes Relative: 6 %
Neutro Abs: 9.5 10*3/uL — ABNORMAL HIGH (ref 1.7–7.7)
Neutrophils Relative %: 82 %
Platelets: 313 10*3/uL (ref 150–400)
RBC: 4.29 MIL/uL (ref 3.87–5.11)
RDW: 13 % (ref 11.5–15.5)
WBC: 11.4 10*3/uL — ABNORMAL HIGH (ref 4.0–10.5)

## 2016-04-01 LAB — URINE MICROSCOPIC-ADD ON: RBC / HPF: NONE SEEN RBC/hpf (ref 0–5)

## 2016-04-01 LAB — POC OCCULT BLOOD, ED: FECAL OCCULT BLD: POSITIVE — AB

## 2016-04-01 LAB — LIPASE, BLOOD: Lipase: 36 U/L (ref 11–51)

## 2016-04-01 MED ORDER — HYOSCYAMINE SULFATE 0.125 MG SL SUBL: 0.1250 mg | SUBLINGUAL_TABLET | SUBLINGUAL | Status: DC | PRN

## 2016-04-01 MED ORDER — SODIUM CHLORIDE 0.9 % IV BOLUS (SEPSIS)
1000.0000 mL | Freq: Once | INTRAVENOUS | Status: AC
  Administered 2016-04-01: 1000 mL via INTRAVENOUS

## 2016-04-01 MED ORDER — IOPAMIDOL (ISOVUE-300) INJECTION 61%
100.0000 mL | Freq: Once | INTRAVENOUS | Status: AC | PRN
  Administered 2016-04-01: 100 mL via INTRAVENOUS

## 2016-04-01 MED ORDER — DIATRIZOATE MEGLUMINE & SODIUM 66-10 % PO SOLN: 15.0000 mL | Freq: Once | ORAL | Status: DC

## 2016-04-01 NOTE — ED Provider Notes (Signed)
CSN: [id number]     Arrival date & time 04/01/16  [id number] History   First MD Initiated Contact with Patient 04/01/16 [id number]     Chief Complaint  Patient presents with  . GI Bleeding     (Consider location/radiation/quality/duration/timing/severity/associated sxs/prior Treatment) The history is provided by the patient.     Pt with hx diverticulitis p/w 2 days of abdominal cramping that she thought was constipation for which she took a laxative, causing more waves of abdominal cramping leading to soft pudding-like stool last night.  This morning she woke up and had a bloody bowel movement at 6am.  Vomited twice overnight.  Took zofran with some relief.    Has had a difficult 6 weeks following cruise to [place] involving various infections treated by PCP : URI, GI symptoms, UTI, acid reflux  Has been on cipro and flagyl, which made her feel worse.  She stopped the flagyl 1 week ago.  Had stool testing performed by PCP and reports this was negative.  PCP Dr [name].    Past Medical History  Diagnosis Date  . Endometriosis   . Diverticulitis   . Hypertension   . Allergic rhinitis   . Breast cancer [facility]) 2011    right side   Past Surgical History  Procedure Laterality Date  . Lipoma excision  08/2002    L shoulder   . Back surgery  09/1999  . Nasal sinus surgery  1994  . Total abdominal hysterectomy  1988  . Appendectomy  1988  . Rectal fissure repair  11/1955  . Breast lumpectomy Right 09/04/2010    w/ Sentinel Node Biopsy  . Breast lumpectomy  8/81962    Fibroadenoma. Left  . Carpel tunnel Right last five years (04/19/15)   Family History  Problem Relation Age of Onset  . Heart disease Father   . Heart disease Mother   . Lung cancer Father     smoker  . Melanoma Daughter    Social History  Substance Use Topics  . Smoking status: Former Smoker -- 1.00 packs/day for 10 years    Types: Cigarettes    Quit date: 11/11/1978  . Smokeless tobacco: None  . Alcohol Use: 1.2  oz/week    2 Glasses of wine per week   OB History    No data available     Review of Systems  All other systems reviewed and are negative.     Allergies  Review of patient's allergies indicates no known allergies.  Home Medications   Prior to Admission medications   Medication Sig Start Date End Date Taking? Authorizing Provider  buPROPion (WELLBUTRIN XL) 150 MG 24 hr tablet Take 150 mg by mouth daily.    Historical Provider, MD  CVS SALINE NOSE SPRAY NA Place 1 spray into the nose 2 (two) times daily as needed. Nasal congestion.    Historical Provider, MD  CYANOCOBALAMIN IJ Inject 1 vial as directed See admin instructions. Every three months.    Historical Provider, MD  CYANOCOBALAMIN PO Take 1 tablet by mouth daily.    Historical Provider, MD  ergocalciferol (VITAMIN D2) 50000 UNITS capsule Take 50,000 Units by mouth once a week.     Historical Provider, MD  fluticasone (FLONASE) 50 MCG/ACT nasal spray Place 2 sprays into the nose as needed.      Historical Provider, MD  GuaiFENesin (MUCINEX PO) Take 600 mg by mouth every 12 (twelve) hours as needed. Congestion.    Historical Provider, MD  letrozole (  FEMARA) 2.5 MG tablet TAKE 1 TABLET BY MOUTH DAILY 12/08/15   Chauncey Cruel, MD  Multiple Vitamins-Minerals (CVS SPECTRAVITE PO) Take by mouth daily.      Historical Provider, MD  naproxen sodium (ALEVE) 220 MG tablet Take 2 tablets (440 mg total) by mouth 2 (two) times daily with a meal. 05/08/15   Chauncey Cruel, MD  nebivolol (BYSTOLIC) 5 MG tablet Take 5 mg by mouth daily.      Historical Provider, MD  omeprazole (PRILOSEC) 20 MG capsule Take 1 capsule (20 mg total) by mouth daily. 05/08/15   Virgie Dad Magrinat, MD   BP 101/75 mmHg  Pulse 93  Temp(Src) 98.8 F (37.1 C) (Oral)  Resp 20  SpO2 98% Physical Exam  Constitutional: She appears well-developed and well-nourished. No distress.  HENT:  Head: Normocephalic and atraumatic.  Neck: Neck supple.  Cardiovascular:  Normal rate and regular rhythm.   Pulmonary/Chest: Effort normal and breath sounds normal. No respiratory distress. She has no wheezes. She has no rales.  Abdominal: Soft. Bowel sounds are normal. She exhibits no distension. There is tenderness in the right upper quadrant and left lower quadrant. There is no rebound and no guarding.  Genitourinary: Rectal exam shows no external hemorrhoid, no internal hemorrhoid, no mass, no tenderness and anal tone normal.  Frank dark thick blood on rectal exam.  No tenderness.  Musculoskeletal: She exhibits no edema.  Neurological: She is alert. She exhibits normal muscle tone.  Skin: She is not diaphoretic.  Psychiatric: She has a normal mood and affect. Her behavior is normal.  Nursing note and vitals reviewed.   ED Course  Procedures (including critical care time) Labs Review Labs Reviewed  COMPREHENSIVE METABOLIC PANEL - Abnormal; Notable for the following:    Sodium 129 (*)    Chloride 99 (*)    Glucose, Bld 105 (*)    Calcium 8.2 (*)    All other components within normal limits  CBC WITH DIFFERENTIAL/PLATELET - Abnormal; Notable for the following:    WBC 11.4 (*)    Neutro Abs 9.5 (*)    All other components within normal limits  URINALYSIS, ROUTINE W REFLEX MICROSCOPIC (NOT AT [facility]) - Abnormal; Notable for the following:    Color, Urine AMBER (*)    Bilirubin Urine SMALL (*)    Protein, ur 30 (*)    Leukocytes, UA SMALL (*)    All other components within normal limits  URINE MICROSCOPIC-ADD ON - Abnormal; Notable for the following:    Squamous Epithelial / LPF 0-5 (*)    Bacteria, UA FEW (*)    Casts HYALINE CASTS (*)    All other components within normal limits  POC OCCULT BLOOD, ED - Abnormal; Notable for the following:    Fecal Occult Bld POSITIVE (*)    All other components within normal limits  URINE CULTURE  LIPASE, BLOOD    Imaging Review Ct Abdomen Pelvis W Contrast  04/01/2016  CLINICAL DATA:  Abdominal pain, rectal  bleeding, vomiting, the patient took a stool softener 2 days ago EXAM: CT ABDOMEN AND PELVIS WITH CONTRAST TECHNIQUE: Multidetector CT imaging of the abdomen and pelvis was performed using the standard protocol following bolus administration of intravenous contrast. CONTRAST:  111mL ISOVUE-300 IOPAMIDOL (ISOVUE-300) INJECTION 61% COMPARISON:  None. FINDINGS: Lower chest:  Lung bases are unremarkable.  Small hiatal hernia. Hepatobiliary: Enhanced liver is unremarkable. No calcified gallstones are noted within gallbladder. Pancreas: Enhanced pancreas is unremarkable. Spleen: Enhanced spleen is unremarkable.  Adrenals/Urinary Tract: No adrenal gland mass. Enhanced kidneys are symmetrical in size. No hydronephrosis or hydroureter. Delayed renal images shows bilateral renal symmetrical excretion. Bilateral visualized proximal ureter is unremarkable. The urinary bladder is unremarkable. Stomach/Bowel: No gastric outlet obstruction no small bowel obstruction. No thickened or dilated small bowel loops. Moderate colonic stool noted in right colon and proximal transverse colon. There is a low lying cecum. No pericecal inflammation. The patient is status post appendectomy. Scattered diverticula are noted descending colon. Multiple sigmoid colon diverticula are noted. Axial image 48 there is segmental thickening of left colons starting in splenic flexure. Mild stranding of pericolonic fat. This is best seen in coronal image 73. Findings are consistent with segmental colitis. No sigmoid colon colitis or diverticulitis. Trace free fluid noted within posterior pelvis. No distal colonic obstruction. Vascular/Lymphatic: No aortic aneurysm. No retroperitoneal or mesenteric adenopathy. Reproductive: The patient is status post hysterectomy. Other: There is no ascites or free abdominal air. Musculoskeletal: No destructive bony lesions are noted. Mild degenerative changes thoracolumbar spine. There is mild disc space flattening at L3-L4  and L4-L5 level. Significant disc space flattening with vacuum disc phenomenon at L5-S1 level. There is about 4 mm anterolisthesis L5 on S1 vertebral body. Question sclerotic bone island anterior aspect of L4 vertebral body. The IMPRESSION: 1. There is segmental thickening of colonic wall in left colon and mild stranding of pericolonic fat. Findings are highly suspicious for segmental colitis. No pericolonic abscess. Scattered left colon diverticula without evidence of diverticular abscess. 2. Multiple sigmoid colon diverticula. No evidence of distal colitis or diverticulitis. 3. Moderate stool noted in right colon and proximal transverse colon. No pericecal inflammation. Appendix is surgically absent. 4. No hydronephrosis or hydroureter. 5. Status post hysterectomy. 6. Degenerative changes lumbar spine. Electronically Signed   By: Lahoma Crocker M.D.   On: 04/01/2016 09:50   I have personally reviewed and evaluated these images and lab results as part of my medical decision-making.   EKG Interpretation None      MDM   Final diagnoses:  Colitis   Afebrile nontoxic patient with 2 days of abdominal cramping, rectal bleeding this morning.  CT demonstrates colitis.  Labs signifcant for leukocytosis, slight but new electrolyte abnormalities of hyponatremia, hypochloremia.  UA unremarkable.  Pilar Plate blood on rectal exam.  Discussed pt with Dr Laneta Simmers.  Pt also seen by GI, Dr Ardis Hughs., who came to speak with [place].  We have d/c patient following recommendations of Dr Ardis Hughs.  Please see his note for further details.  Discussed result, findings, treatment, and follow up  with patient.  Pt given return precautions.  Pt verbalizes understanding and agrees with plan.      Clayton Bibles, PA-C 04/01/16 1518  Leo Grosser, MD 04/01/16 Carollee Massed

## 2016-04-01 NOTE — Telephone Encounter (Signed)
-----   Message from Milus Banister, MD sent at 04/01/2016 11:22 AM EDT ----- She is in ER at [facility], going home soon.   She needs colonsocopy with me in 3-4 weeks [facility], for left sided colitis.  Thanks

## 2016-04-01 NOTE — ED Notes (Signed)
Pt was constipated two days ago and took a stool softener, had loose stool as a result, this am she had blood in her stool, was treated for cdiff, which was negative, at the beginning of the month after visiting [place]

## 2016-04-01 NOTE — ED Notes (Signed)
Verbalized understanding discharge instructions, prescriptions, and follow-up. In no acute distress.   

## 2016-04-01 NOTE — ED Notes (Signed)
MD at bedside. Dr. Eugenie Birks GI present to speak with pt. Pt aware of discharge. Dr. David Stall states can cancel I stat lactic. EDP Knott and GI Dr. Ardis Hughs speaking about pt at present

## 2016-04-01 NOTE — ED Notes (Addendum)
Tried 3 times to get pt's blood collected. MD still in pt's room discussing plan of care.

## 2016-04-01 NOTE — ED Notes (Signed)
PT GIVEN GINGER ALE.

## 2016-04-01 NOTE — Discharge Instructions (Signed)
Read the information below.  Use the prescribed medication as directed.  Please discuss all new medications with your pharmacist.  You may return to the Emergency Department at any time for worsening condition or any new symptoms that concern you.    If you develop high fevers, worsening abdominal pain, uncontrolled vomiting, or are unable to tolerate fluids by mouth, return to the ER for a recheck.     Colitis Colitis is inflammation of the colon. Colitis may last a short time (acute) or it may last a long time (chronic). CAUSES This condition may be caused by:  Viruses.  Bacteria.  Reactions to medicine.  Certain autoimmune diseases, such as Crohn disease or ulcerative colitis. SYMPTOMS Symptoms of this condition include:  Diarrhea.  Passing bloody or tarry stool.  Pain.  Fever.  Vomiting.  Tiredness (fatigue).  Weight loss.  Bloating.  Sudden increase in abdominal pain.  Having fewer bowel movements than usual. DIAGNOSIS This condition is diagnosed with a stool test or a blood test. You may also have other tests, including X-rays, a CT scan, or a colonoscopy. TREATMENT Treatment may include:  Resting the bowel. This involves not eating or drinking for a period of time.  Fluids that are given through an IV tube.  Medicine for pain and diarrhea.  Antibiotic medicines.  Cortisone medicines.  Surgery. HOME CARE INSTRUCTIONS Eating and Drinking  Follow instructions from your health care provider about eating or drinking restrictions.  Drink enough fluid to keep your urine clear or pale yellow.  Work with a dietitian to determine which foods cause your condition to flare up.  Avoid foods that cause flare-ups.  Eat a well-balanced diet. Medicines  Take over-the-counter and prescription medicines only as told by your health care provider.  If you were prescribed an antibiotic medicine, take it as told by your health care provider. Do not stop taking the  antibiotic even if you start to feel better. General Instructions  Keep all follow-up visits as told by your health care provider. This is important. SEEK MEDICAL CARE IF:  Your symptoms do not go away.  You develop new symptoms. SEEK IMMEDIATE MEDICAL CARE IF:  You have a fever that does not go away with treatment.  You develop chills.  You have extreme weakness, fainting, or dehydration.  You have repeated vomiting.  You develop severe pain in your abdomen.  You pass bloody or tarry stool.   This information is not intended to replace advice given to you by your health care provider. Make sure you discuss any questions you have with your health care provider.   Document Released: 12/05/2004 Document Revised: 07/19/2015 Document Reviewed: 02/20/2015 Elsevier Interactive Patient Education [employer].

## 2016-04-01 NOTE — Consult Note (Signed)
Referring Provider: No ref. provider found Primary Care Physician:  Jerlyn Ly, MD Primary Gastroenterologist:  Dr. Olevia Perches  Reason for Consultation:  Abdominal pain, diarrhea, bloody stool  HPI: [name] is a 75 y.o. female who has a past medical history of endometriosis, hypertension, breast cancer. She visited [place] and returned about 6 weeks ago. Since that time she's had issues with upper respiratory infection, UTI, and some bowel issues. In regards to the bowel issues she was started on some Cipro by her PCP. Due to ongoing symptoms and concern for possible parasite Flagyl was added to her regimen. Stool studies for O&P were then ordered and were negative.  She had finished the course of the Cipro, but had issues tolerating the Flagyl, therefore, it was discontinued. This was then followed by some lower abdominal pain and she had not moved her bowels in a couple of days so she took 2 Dulcolax at home. She did not have a bowel movement for several hours, but then developed severe cramping abdominal pain, which was eventually followed by liquid stool. She was able to sleep some through the night, but then woke up at 6 AM this morning and passed some blood in the toilet bowl, which prompted her ER visit.  She is actually getting ready to go to CT scan now. She has a very mildly leukocytosis of 11.4. Sodium is low at 129, but labs otherwise unremarkable.   Past Medical History  Diagnosis Date  . Endometriosis   . Diverticulitis   . Hypertension   . Allergic rhinitis   . Breast cancer [facility]) 2011    right side    Past Surgical History  Procedure Laterality Date  . Lipoma excision  08/2002    L shoulder   . Back surgery  09/1999  . Nasal sinus surgery  1994  . Total abdominal hysterectomy  1988  . Appendectomy  1988  . Rectal fissure repair  11/1955  . Breast lumpectomy Right 09/04/2010    w/ Sentinel Node Biopsy  . Breast lumpectomy  8/81962    Fibroadenoma. Left  .  Carpel tunnel Right last five years (04/19/15)    Prior to Admission medications   Medication Sig Start Date End Date Taking? Authorizing Provider  buPROPion (WELLBUTRIN XL) 150 MG 24 hr tablet Take 150 mg by mouth daily.   Yes Historical Provider, MD  CALCIUM & MAGNESIUM CARBONATES PO Take 1 tablet by mouth daily.   Yes Historical Provider, MD  CVS SALINE NOSE SPRAY NA Place 1 spray into the nose 2 (two) times daily as needed. Nasal congestion.   Yes Historical Provider, MD  CYANOCOBALAMIN IJ Inject 1 vial as directed See admin instructions. Every three months. Patient had shot on 5/8.   Yes Historical Provider, MD  CYANOCOBALAMIN PO Take 1 tablet by mouth daily.   Yes Historical Provider, MD  ergocalciferol (VITAMIN D2) 50000 UNITS capsule Take 50,000 Units by mouth once a week. Patient is due for next dose.   Yes Historical Provider, MD  fluticasone (FLONASE) 50 MCG/ACT nasal spray Place 2 sprays into the nose as needed.     Yes Historical Provider, MD  GuaiFENesin (MUCINEX PO) Take 600 mg by mouth every 12 (twelve) hours as needed. Congestion.   Yes Historical Provider, MD  letrozole [facility]) 2.5 MG tablet TAKE 1 TABLET BY MOUTH DAILY 12/08/15  Yes Chauncey Cruel, MD  naproxen sodium (ALEVE) 220 MG tablet Take 220 mg by mouth 2 (two) times daily  as needed (leg pain).  05/08/15  Yes Chauncey Cruel, MD  nebivolol (BYSTOLIC) 5 MG tablet Take 5 mg by mouth daily.     Yes Historical Provider, MD  ondansetron (ZOFRAN-ODT) 8 MG disintegrating tablet Take 8 mg by mouth every 8 (eight) hours as needed for nausea or vomiting.   Yes Historical Provider, MD  ranitidine (ZANTAC) 150 MG tablet Take 150 mg by mouth daily.   Yes Historical Provider, MD    Current Facility-Administered Medications  Medication Dose Route Frequency Provider Last Rate Last Dose  . diatrizoate meglumine-sodium (GASTROGRAFIN) 66-10 % solution 15 mL  15 mL Oral Once Leo Grosser, MD      . sodium chloride 0.9 % bolus 1,000 mL   1,000 mL Intravenous Once Clayton Bibles, PA-C       Current Outpatient Prescriptions  Medication Sig Dispense Refill  . buPROPion (WELLBUTRIN XL) 150 MG 24 hr tablet Take 150 mg by mouth daily.    Marland Kitchen CALCIUM & MAGNESIUM CARBONATES PO Take 1 tablet by mouth daily.    . CVS SALINE NOSE SPRAY NA Place 1 spray into the nose 2 (two) times daily as needed. Nasal congestion.    . CYANOCOBALAMIN IJ Inject 1 vial as directed See admin instructions. Every three months. Patient had shot on 5/8.    Marland Kitchen CYANOCOBALAMIN PO Take 1 tablet by mouth daily.    . ergocalciferol (VITAMIN D2) 50000 UNITS capsule Take 50,000 Units by mouth once a week. Patient is due for next dose.    . fluticasone (FLONASE) 50 MCG/ACT nasal spray Place 2 sprays into the nose as needed.      . GuaiFENesin (MUCINEX PO) Take 600 mg by mouth every 12 (twelve) hours as needed. Congestion.    Marland Kitchen letrozole (FEMARA) 2.5 MG tablet TAKE 1 TABLET BY MOUTH DAILY 90 tablet 3  . naproxen sodium (ALEVE) 220 MG tablet Take 220 mg by mouth 2 (two) times daily as needed (leg pain).     . nebivolol (BYSTOLIC) 5 MG tablet Take 5 mg by mouth daily.      . ondansetron (ZOFRAN-ODT) 8 MG disintegrating tablet Take 8 mg by mouth every 8 (eight) hours as needed for nausea or vomiting.    . ranitidine (ZANTAC) 150 MG tablet Take 150 mg by mouth daily.      Allergies as of 04/01/2016  . (No Known Allergies)    Family History  Problem Relation Age of Onset  . Heart disease Father   . Heart disease Mother   . Lung cancer Father     smoker  . Melanoma Daughter     Social History   Social History  . Marital Status: Married    Spouse Name: N/A  . Number of Children: N/A  . Years of Education: N/A   Occupational History  . Primary school teacher    Social History Main Topics  . Smoking status: Former Smoker -- 1.00 packs/day for 10 years    Types: Cigarettes    Quit date: 11/11/1978  . Smokeless tobacco: Not on file  . Alcohol Use: 1.2 oz/week     2 Glasses of wine per week  . Drug Use: No  . Sexual Activity: Not on file   Other Topics Concern  . Not on file   Social History Narrative    Review of Systems: Ten point ROS is O/W negative except as mentioned in HPI.  Physical Exam: Vital signs in last 24 hours: Temp:  [98.8 F (37.1 C)]  98.8 F (37.1 C) (05/22 0706) Pulse Rate:  [93] 93 (05/22 0706) Resp:  [20] 20 (05/22 0706) BP: (101)/(75) 101/75 mmHg (05/22 0706) SpO2:  [98 %] 98 % (05/22 0706)   General:  Alert, Well-developed, well-nourished, pleasant and cooperative in NAD Head:  Normocephalic and atraumatic. Eyes:  Sclera clear, no icterus.  Conjunctiva pink. Ears:  Normal auditory acuity. Mouth:  No deformity or lesions.   Lungs:  Clear throughout to auscultation.  No wheezes, crackles, or rhonchi.  Heart:  Regular rate and rhythm; no murmurs, clicks, rubs, or gallops. Abdomen:  Soft, non-distended.  BS present.  Non-tender. Rectal:  Deferred.  Msk:  Symmetrical without gross deformities. Pulses:  Normal pulses noted. Extremities:  Without clubbing or edema. Neurologic:  Alert and oriented x 4;  grossly normal neurologically. Skin:  Intact without significant lesions or rashes. Psych:  Alert and cooperative. Normal mood and affect.  IMPRESSION:  *75 year old female who has experienced some bowel issues over the past 6 weeks since returning from [place]. Was empirically treated with Cipro and Flagyl and then subsequently developed constipation. Now she's had acute onset of cramping abdominal pain and diarrhea with rectal bleeding after taking 2 laxatives at home yesterday.  We suspect that she has possibly induce some ischemic colitis. She is going for CT scan currently so we will follow-up those results.  PLAN: -Follow-up CT scan results; further recommendations pending findings on this. -IV fluids, anti-emetics, pain control.   ZEHR, JESSICA D.  04/01/2016, 8:47 AM  Pager number  [phone]    ________________________________________________________________________  Velora Heckler GI MD note:  I personally examined the patient, reviewed the data and agree with the assessment and plan described above. CT scan shows left colon inflammation.  Unclear etiology but I favor initial infectious process that may be complicated by mild ischemic colitis.  I prefer that she remain off antibiotics for now.  Bland diet, pushing fluids.  Will need prescription for levsin SL antispasm med to take one to two tablets as needed for cramping.  I expect she will improve with time.  My office will be contacting her about colonoscopy in 3-4 weeks as an outpatient. She knows to call if she is worsening.   Owens Loffler, MD [facility] Pager [phone]

## 2016-04-02 LAB — URINE CULTURE

## 2016-04-02 NOTE — Telephone Encounter (Signed)
Pre visit and colon has been scheduled and pt is aware.  She will call with any further concerns

## 2016-04-03 ENCOUNTER — Telehealth: Payer: Self-pay | Admitting: Gastroenterology

## 2016-04-03 DIAGNOSIS — H2511 Age-related nuclear cataract, right eye: Secondary | ICD-10-CM | POA: Diagnosis not present

## 2016-04-03 NOTE — Telephone Encounter (Signed)
That is OK with me if it is OK with him

## 2016-04-03 NOTE — Telephone Encounter (Signed)
Can see me on 04/17/16 or app if needed sooner

## 2016-04-03 NOTE — Telephone Encounter (Signed)
Do you want pt scheduled with an APP for OV?

## 2016-04-03 NOTE — Telephone Encounter (Signed)
Ok with me She will need colonoscopy in 4-8 weeks, also OV after recent ED visit with CT showing left sided colitis

## 2016-04-03 NOTE — Telephone Encounter (Signed)
Dr Jacobs is this ok? 

## 2016-04-03 NOTE — Telephone Encounter (Signed)
Dr Pyrtle is this ok with you? 

## 2016-04-04 NOTE — Telephone Encounter (Signed)
Pt scheduled to see Dr. Hilarie Fredrickson 04/17/16@11 :15am. Pt aware of appt.

## 2016-04-17 ENCOUNTER — Other Ambulatory Visit (INDEPENDENT_AMBULATORY_CARE_PROVIDER_SITE_OTHER): Payer: PPO

## 2016-04-17 ENCOUNTER — Ambulatory Visit (INDEPENDENT_AMBULATORY_CARE_PROVIDER_SITE_OTHER): Payer: PPO | Admitting: Internal Medicine

## 2016-04-17 ENCOUNTER — Encounter: Payer: Self-pay | Admitting: Internal Medicine

## 2016-04-17 VITALS — BP 110/72 | HR 68 | Ht 61.0 in | Wt 163.0 lb

## 2016-04-17 DIAGNOSIS — K559 Vascular disorder of intestine, unspecified: Secondary | ICD-10-CM | POA: Diagnosis not present

## 2016-04-17 DIAGNOSIS — R110 Nausea: Secondary | ICD-10-CM

## 2016-04-17 DIAGNOSIS — R933 Abnormal findings on diagnostic imaging of other parts of digestive tract: Secondary | ICD-10-CM

## 2016-04-17 DIAGNOSIS — N390 Urinary tract infection, site not specified: Secondary | ICD-10-CM | POA: Diagnosis not present

## 2016-04-17 LAB — URINALYSIS, ROUTINE W REFLEX MICROSCOPIC
BILIRUBIN URINE: NEGATIVE
Hgb urine dipstick: NEGATIVE
KETONES UR: NEGATIVE
LEUKOCYTES UA: NEGATIVE
Nitrite: NEGATIVE
PH: 6.5 (ref 5.0–8.0)
RBC / HPF: NONE SEEN (ref 0–?)
TOTAL PROTEIN, URINE-UPE24: NEGATIVE
UROBILINOGEN UA: 0.2 (ref 0.0–1.0)
Urine Glucose: NEGATIVE
WBC, UA: NONE SEEN (ref 0–?)

## 2016-04-17 LAB — COMPREHENSIVE METABOLIC PANEL
ALBUMIN: 4.3 g/dL (ref 3.5–5.2)
ALK PHOS: 57 U/L (ref 39–117)
ALT: 18 U/L (ref 0–35)
AST: 19 U/L (ref 0–37)
BUN: 11 mg/dL (ref 6–23)
CALCIUM: 9.4 mg/dL (ref 8.4–10.5)
CHLORIDE: 100 meq/L (ref 96–112)
CO2: 30 mEq/L (ref 19–32)
Creatinine, Ser: 0.77 mg/dL (ref 0.40–1.20)
GFR: 77.75 mL/min (ref >60.00)
Glucose, Bld: 91 mg/dL (ref 70–99)
POTASSIUM: 4.4 meq/L (ref 3.5–5.1)
Sodium: 135 mEq/L (ref 135–145)
TOTAL PROTEIN: 7.2 g/dL (ref 6.0–8.3)
Total Bilirubin: 0.4 mg/dL (ref 0.2–1.2)

## 2016-04-17 LAB — CBC WITH DIFFERENTIAL/PLATELET
BASOS PCT: 0.1 % (ref 0.0–3.0)
Basophils Absolute: 0 10*3/uL (ref 0.0–0.1)
EOS PCT: 2.9 % (ref 0.0–5.0)
Eosinophils Absolute: 0.2 10*3/uL (ref 0.0–0.7)
HEMATOCRIT: 40.1 % (ref 36.0–46.0)
HEMOGLOBIN: 13.5 g/dL (ref 12.0–15.0)
LYMPHS PCT: 31.8 % (ref 12.0–46.0)
Lymphs Abs: 1.7 10*3/uL (ref 0.7–4.0)
MCHC: 33.7 g/dL (ref 30.0–36.0)
MCV: 89.7 fl (ref 78.0–100.0)
MONOS PCT: 7.6 % (ref 3.0–12.0)
Monocytes Absolute: 0.4 10*3/uL (ref 0.1–1.0)
Neutro Abs: 3.1 10*3/uL (ref 1.4–7.7)
Neutrophils Relative %: 57.6 % (ref 43.0–77.0)
Platelets: 347 10*3/uL (ref 150.0–400.0)
RBC: 4.47 Mil/uL (ref 3.87–5.11)
RDW: 13.5 % (ref 11.5–15.5)
WBC: 5.3 10*3/uL (ref 4.0–10.5)

## 2016-04-17 LAB — HIGH SENSITIVITY CRP: CRP HIGH SENSITIVITY: 2.21 mg/L (ref 0.000–5.000)

## 2016-04-17 LAB — IGA: IGA: 361 mg/dL (ref 68–378)

## 2016-04-17 MED ORDER — NA SULFATE-K SULFATE-MG SULF 17.5-3.13-1.6 GM/177ML PO SOLN: ORAL | Status: DC

## 2016-04-17 NOTE — Patient Instructions (Signed)
You have been scheduled for a colonoscopy. Please follow written instructions given to you at your visit today.  Please pick up your prep supplies at the pharmacy within the next 1-3 days. If you use inhalers (even only as needed), please bring them with you on the day of your procedure. Your physician has requested that you go to www.startemmi.com and enter the access code given to you at your visit today. This web site gives a general overview about your procedure. However, you should still follow specific instructions given to you by our office regarding your preparation for the procedure.  Your physician has requested that you go to the basement for the following lab work before leaving today: CBC, CMP, Celiac, CRP, U/A, culture  Continue Levsin and Zofran as needed.  Continue Florastor at least once daily.  If you are age 75 or older, your body mass index should be between 23-30. Your Body mass index is 30.81 kg/(m^2). If this is out of the aforementioned range listed, please consider follow up with your Primary Care Provider.  If you are age 79 or younger, your body mass index should be between 19-25. Your Body mass index is 30.81 kg/(m^2). If this is out of the aformentioned range listed, please consider follow up with your Primary Care Provider.

## 2016-04-17 NOTE — Progress Notes (Signed)
Patient ID: [name], female   DOB: 1940-12-10, 75 y.o.   MRN: [id number] HPI: [name] is a 75 year old female with a past medical history of recent probable ischemic colitis, diverticulosis and remote diverticulitis, breast cancer status post chemotherapy, hypertension and endometriosis status post total abdominal hysterectomy who is seen in hospital follow-up. She is here today with her husband. She developed constipation about 2-1/2 weeks ago after which she used over-the-counter laxative and then developed lower abdominal cramping pain which became intense. By the next morning she had developed bloody stools and went to the ER. In the ER on 04/01/2016 she was evaluated and seen by Debbie Bogus, PA-C and Dr. Ardis Sparks. CT scan was performed on that day which showed segmental thickening of the colonic wall beginning at the splenic flexure and involving the left colon. There was mild stranding of pericolonic fat. There is no abscess. There are scattered left colon diverticula without diverticular abscess. In the sigmoid there were multiple diverticuli with no evidence of diverticulitis. There is moderate stool in the right colon and proximal transverse colon with no pericecal inflammation. Surgically absent appendix. Normal kidneys.  She reports that she developed GI symptoms and vague symptoms where she simply didn't feel well since a trip to [place] and [place]. She returned home on 02/12/2016. She states she felt that she had a series of "little bugs". She was treated for URI with Mucinex and nose drops. She then developed loose stools with nausea and abdominal discomfort. This seemed to be self resolving. She then developed UTI was treated with Cipro. He continued to feel unwell and saw [name] with primary care. She was tested for intestinal parasite and treated with 5 days of Flagyl. She did not tolerate Flagyl well and felt overall sick and nauseous. She did not complete the course of therapy.  Late in the week around 03/30/2016 and 03/31/2016 she developed constipation. On Sunday morning at 10 AM she took 2 Dulcolax laxatives and at 3 PM developed abdominal pain becoming intense across the lower abdomen. She talked to [name] her on-call physician with [facility] who recommended a Fleet's enema and a bottle of magnesium citrate. 4 hours later she developed loose stools and diarrhea. She went to bed about midnight and woke up with bloody stools. She then went to the ER. In the ER she had a mild leukocytosis at 11.4 and hyponatremic at 129.  Her bloody stools have resolved. Her lower abdominal pain has improved dramatically. Bowel habits have become more normal for her. They're occurring daily to every other day. She denies diarrhea or feeling constipated. She has continued to have nausea and fatigue. No vomiting. She's used Zofran 2 days earlier this week for nausea. Appetite seems to be okay and nausea actually improves with eating. Sometime in the last 6 weeks she did Zantac therapy but has now stopped. She was having some heartburn symptoms but these seem to have resolved. She is rarely using Levsin at this point. No fevers or chills.  Also of note she possibly developed tendinitis secondary to ciprofloxacin  Her last colonoscopy was with [name] in August 2008. This revealed moderate to severe sigmoid diverticulosis. No polyps were seen.  Past Medical History  Diagnosis Date  . Endometriosis   . Diverticulitis   . Hypertension   . Allergic rhinitis   . Breast cancer [facility]) 2011    right side    Past Surgical History  Procedure Laterality Date  .  Lipoma excision  08/2002    L shoulder   . Back surgery  09/1999  . Nasal sinus surgery  1994  . Total abdominal hysterectomy  1988  . Appendectomy  1988  . Rectal fissure repair  11/1955  . Breast lumpectomy Right 09/04/2010    w/ Sentinel Node Biopsy  . Breast lumpectomy  8/81962    Fibroadenoma. Left  . Carpel  tunnel Right last five years (04/19/15)  . Trigger finger release      x3    Outpatient Prescriptions Prior to Visit  Medication Sig Dispense Refill  . buPROPion (WELLBUTRIN XL) 150 MG 24 hr tablet Take 150 mg by mouth daily.    Marland Kitchen CALCIUM & MAGNESIUM CARBONATES PO Take 1 tablet by mouth daily.    . [employer] SALINE NOSE SPRAY NA Place 1 spray into the nose 2 (two) times daily as needed. Nasal congestion.    . CYANOCOBALAMIN IJ Inject 1 vial as directed See admin instructions. Every three months. Patient had shot on 5/8.    Marland Kitchen CYANOCOBALAMIN PO Take 1 tablet by mouth daily.    . ergocalciferol (VITAMIN D2) 50000 UNITS capsule Take 50,000 Units by mouth once a week. Patient is due for next dose.    . fluticasone (FLONASE) 50 MCG/ACT nasal spray Place 2 sprays into the nose as needed.      . GuaiFENesin (MUCINEX PO) Take 600 mg by mouth every 12 (twelve) hours as needed. Congestion.    . hyoscyamine (LEVSIN/SL) 0.125 MG SL tablet Place 1-2 tablets (0.125-0.25 mg total) under the tongue every 4 (four) hours as needed for cramping. 30 tablet 0  . letrozole (FEMARA) 2.5 MG tablet TAKE 1 TABLET BY MOUTH DAILY 90 tablet 3  . naproxen sodium (ALEVE) 220 MG tablet Take 220 mg by mouth 2 (two) times daily as needed (leg pain).     . nebivolol (BYSTOLIC) 5 MG tablet Take 5 mg by mouth daily.      . ondansetron (ZOFRAN-ODT) 8 MG disintegrating tablet Take 8 mg by mouth every 8 (eight) hours as needed for nausea or vomiting.    . ranitidine (ZANTAC) 150 MG tablet Take 150 mg by mouth daily.     No facility-administered medications prior to visit.    No Known Allergies  Family History  Problem Relation Age of Onset  . Heart disease Father   . Heart disease Mother   . Lung cancer Father     smoker  . Melanoma Daughter     Social History  Substance Use Topics  . Smoking status: Former Smoker -- 1.00 packs/day for 10 years    Types: Cigarettes    Quit date: 11/11/1978  . Smokeless tobacco: Never Used   . Alcohol Use: 1.2 oz/week    2 Glasses of wine per week    ROS: As per history of present illness, otherwise negative  BP 110/72 mmHg  Pulse 68  Ht 5\' 1"  (1.549 m)  Wt 163 lb (73.936 kg)  BMI 30.81 kg/m2 Constitutional: Well-developed and well-nourished. No distress. HEENT: Normocephalic and atraumatic. Oropharynx is clear and moist. No oropharyngeal exudate. Conjunctivae are normal.  No scleral icterus. Neck: Neck supple. Trachea midline. Cardiovascular: Normal rate, regular rhythm and intact distal pulses. No M/R/G Pulmonary/chest: Effort normal and breath sounds normal. No wheezing, rales or rhonchi. Abdominal: Soft, Mild lower abdominal tenderness without rebound or guarding, nondistended. Bowel sounds active throughout. There are no masses palpable. Extremities: no clubbing, cyanosis, or edema Lymphadenopathy: No cervical adenopathy  noted. Neurological: Alert and oriented to person place and time. Skin: Skin is warm and dry. No rashes noted. Psychiatric: Normal mood and affect. Behavior is normal.  RELEVANT LABS AND IMAGING: CBC    Component Value Date/Time   WBC 5.3 04/17/2016 1222   WBC 5.4 10/17/2015 1230   RBC 4.47 04/17/2016 1222   RBC 4.22 10/17/2015 1230   HGB 13.5 04/17/2016 1222   HGB 12.8 10/17/2015 1230   HCT 40.1 04/17/2016 1222   HCT 38.5 10/17/2015 1230   PLT 347.0 04/17/2016 1222   PLT 325 10/17/2015 1230   MCV 89.7 04/17/2016 1222   MCV 91.2 10/17/2015 1230   MCH 30.5 04/01/2016 0840   MCH 30.3 10/17/2015 1230   MCHC 33.7 04/17/2016 1222   MCHC 33.2 10/17/2015 1230   RDW 13.5 04/17/2016 1222   RDW 13.5 10/17/2015 1230   LYMPHSABS 1.7 04/17/2016 1222   LYMPHSABS 1.5 10/17/2015 1230   MONOABS 0.4 04/17/2016 1222   MONOABS 0.4 10/17/2015 1230   EOSABS 0.2 04/17/2016 1222   EOSABS 0.1 10/17/2015 1230   BASOSABS 0.0 04/17/2016 1222   BASOSABS 0.0 10/17/2015 1230    CMP     Component Value Date/Time   NA 135 04/17/2016 1222   NA 137  10/17/2015 1230   K 4.4 04/17/2016 1222   K 4.4 10/17/2015 1230   CL 100 04/17/2016 1222   CO2 30 04/17/2016 1222   CO2 28 10/17/2015 1230   GLUCOSE 91 04/17/2016 1222   GLUCOSE 83 10/17/2015 1230   BUN 11 04/17/2016 1222   BUN 12.6 10/17/2015 1230   CREATININE 0.77 04/17/2016 1222   CREATININE 0.8 10/17/2015 1230   CALCIUM 9.4 04/17/2016 1222   CALCIUM 9.9 10/17/2015 1230   PROT 7.2 04/17/2016 1222   PROT 7.0 10/17/2015 1230   ALBUMIN 4.3 04/17/2016 1222   ALBUMIN 3.8 10/17/2015 1230   AST 19 04/17/2016 1222   AST 24 10/17/2015 1230   ALT 18 04/17/2016 1222   ALT 22 10/17/2015 1230   ALKPHOS 57 04/17/2016 1222   ALKPHOS 73 10/17/2015 1230   BILITOT 0.4 04/17/2016 1222   BILITOT 0.44 10/17/2015 1230   GFRNONAA >60 04/01/2016 0840   GFRAA >60 04/01/2016 0840    ASSESSMENT/PLAN: 75 year old female with a past medical history of recent probable ischemic colitis, diverticulosis and remote diverticulitis, breast cancer status post chemotherapy, hypertension and endometriosis status post total abdominal hysterectomy who is seen in hospital follow-up.   1. Left-sided colitis -- based on symptoms and story I'm extremely suspicious for an episode of ischemic colitis. I'm less suspicious for an infectious colitis or diverticulitis. Symptoms are improving significantly. I have recommended that she continue Florastor to 50 mg daily. Repeat CBC, CMP today. Check celiac panel and CRP. I recommended colonoscopy for direct visualization of the colon. We discussed the risks, benefits and alternatives and she is agreeable to proceed. She can use Levsin on an as-needed basis for lower abdominal cramping.If she is tending to become more and more constipated I recommended MiraLAX as the best laxative for her.  2. Nausea -- mild. Possibly multifactorial. She may have an element of postinfectious irritable bowel which can contribute to nausea. Given her recent UTI repeating a urinalysis and urine culture  to exclude infection which could also cause nausea. She can continue to use Zofran 4-8 mg every 8 hours on an as-needed basis. If there is ongoing colitis this could also contribute to nausea. I asked that she notify me should this worsen.  She voiced understanding  25 minutes spent with the patient today. Greater than 50% was spent in counseling and coordination of care with the patient     [id number] [name], [place] [address] [place], [place] [address]

## 2016-04-18 LAB — URINE CULTURE: Colony Count: 5000

## 2016-04-18 LAB — TISSUE TRANSGLUTAMINASE, IGA: Tissue Transglutaminase Ab, IgA: 1 U/mL (ref <4)

## 2016-04-26 ENCOUNTER — Telehealth: Payer: Self-pay | Admitting: Internal Medicine

## 2016-04-26 NOTE — Telephone Encounter (Signed)
Pt states she went to pick up her prep for colon and it is going to be 80.00. Pt wanted to know if there is a sample she can have.

## 2016-04-26 NOTE — Telephone Encounter (Signed)
Left message for pt to call back  °

## 2016-04-29 NOTE — Telephone Encounter (Signed)
Left message that I would put patient on the list for a free Suprep sample and call her when I got another shipment

## 2016-05-01 ENCOUNTER — Encounter: Payer: PPO | Admitting: Gastroenterology

## 2016-05-06 ENCOUNTER — Other Ambulatory Visit: Payer: PPO

## 2016-05-06 ENCOUNTER — Encounter: Payer: Self-pay | Admitting: Oncology

## 2016-05-06 ENCOUNTER — Encounter: Payer: PPO | Admitting: Oncology

## 2016-05-06 ENCOUNTER — Ambulatory Visit: Payer: PPO

## 2016-05-06 NOTE — Progress Notes (Signed)
Not seen on this date

## 2016-05-06 NOTE — Progress Notes (Signed)
No SHOW for Injection appointment

## 2016-05-08 DIAGNOSIS — H2511 Age-related nuclear cataract, right eye: Secondary | ICD-10-CM | POA: Diagnosis not present

## 2016-05-08 DIAGNOSIS — H2512 Age-related nuclear cataract, left eye: Secondary | ICD-10-CM | POA: Diagnosis not present

## 2016-05-09 ENCOUNTER — Encounter: Payer: PPO | Admitting: Internal Medicine

## 2016-05-13 ENCOUNTER — Encounter: Payer: Self-pay | Admitting: Internal Medicine

## 2016-05-14 ENCOUNTER — Other Ambulatory Visit: Payer: Self-pay | Admitting: Oncology

## 2016-05-15 DIAGNOSIS — H2512 Age-related nuclear cataract, left eye: Secondary | ICD-10-CM | POA: Diagnosis not present

## 2016-05-16 ENCOUNTER — Telehealth: Payer: Self-pay | Admitting: Internal Medicine

## 2016-05-16 NOTE — Telephone Encounter (Signed)
[name] you had called the pt regarding a sample in previous note. Pt calling to see if suprep sample has arrived. Pt has procedure scheduled for 05/22/16. Please advise.

## 2016-05-16 NOTE — Telephone Encounter (Signed)
Spoke with patient and told her I would leave a Suprep sample up front to be picked up.  Patient agreed. 

## 2016-05-22 ENCOUNTER — Ambulatory Visit (AMBULATORY_SURGERY_CENTER): Payer: PPO | Admitting: Internal Medicine

## 2016-05-22 ENCOUNTER — Encounter: Payer: Self-pay | Admitting: Internal Medicine

## 2016-05-22 VITALS — BP 156/77 | HR 66 | Temp 98.4°F | Resp 22 | Ht 61.0 in | Wt 163.0 lb

## 2016-05-22 DIAGNOSIS — E669 Obesity, unspecified: Secondary | ICD-10-CM | POA: Diagnosis not present

## 2016-05-22 DIAGNOSIS — R933 Abnormal findings on diagnostic imaging of other parts of digestive tract: Secondary | ICD-10-CM

## 2016-05-22 DIAGNOSIS — K559 Vascular disorder of intestine, unspecified: Secondary | ICD-10-CM

## 2016-05-22 DIAGNOSIS — D122 Benign neoplasm of ascending colon: Secondary | ICD-10-CM

## 2016-05-22 DIAGNOSIS — I10 Essential (primary) hypertension: Secondary | ICD-10-CM | POA: Diagnosis not present

## 2016-05-22 MED ORDER — SODIUM CHLORIDE 0.9 % IV SOLN: 500.0000 mL | INTRAVENOUS | Status: DC

## 2016-05-22 NOTE — Progress Notes (Signed)
Called to room to assist during endoscopic procedure.  Patient ID and intended procedure confirmed with present staff. Received instructions for my participation in the procedure from the performing physician.  

## 2016-05-22 NOTE — Progress Notes (Signed)
To recovery, report to Myers, RN, VSS. 

## 2016-05-22 NOTE — Patient Instructions (Signed)
Colon polyps removed today, and diverticulosis seen. Handouts given on polyps,diverticulosis, and high fiber diet. Result letter in your mail in 2-3 weeks. Resume current medications. Call us with any questions or concerns. Thank you!!   YOU HAD AN ENDOSCOPIC PROCEDURE TODAY AT [facility]:   Refer to the procedure report that was given to you for any specific questions about what was found during the examination.  If the procedure report does not answer your questions, please call your gastroenterologist to clarify.  If you requested that your care partner not be given the details of your procedure findings, then the procedure report has been included in a sealed envelope for you to review at your convenience later.  YOU SHOULD EXPECT: Some feelings of bloating in the abdomen. Passage of more gas than usual.  Walking can help get rid of the air that was put into your GI tract during the procedure and reduce the bloating. If you had a lower endoscopy (such as a colonoscopy or flexible sigmoidoscopy) you may notice spotting of blood in your stool or on the toilet paper. If you underwent a bowel prep for your procedure, you may not have a normal bowel movement for a few days.  Please Note:  You might notice some irritation and congestion in your nose or some drainage.  This is from the oxygen used during your procedure.  There is no need for concern and it should clear up in a day or so.  SYMPTOMS TO REPORT IMMEDIATELY:   Following lower endoscopy (colonoscopy or flexible sigmoidoscopy):  Excessive amounts of blood in the stool  Significant tenderness or worsening of abdominal pains  Swelling of the abdomen that is new, acute  Fever of 100F or higher  For urgent or emergent issues, a gastroenterologist can be reached at any hour by calling [phone].   DIET: Your first meal following the procedure should be a small meal and then it is ok to progress to your normal diet.  Heavy or fried foods are harder to digest and may make you feel nauseous or bloated.  Likewise, meals heavy in dairy and vegetables can increase bloating.  Drink plenty of fluids but you should avoid alcoholic beverages for 24 hours.  ACTIVITY:  You should plan to take it easy for the rest of today and you should NOT DRIVE or use heavy machinery until tomorrow (because of the sedation medicines used during the test).    FOLLOW UP: Our staff will call the number listed on your records the next business day following your procedure to check on you and address any questions or concerns that you may have regarding the information given to you following your procedure. If we do not reach you, we will leave a message.  However, if you are feeling well and you are not experiencing any problems, there is no need to return our call.  We will assume that you have returned to your regular daily activities without incident.  If any biopsies were taken you will be contacted by phone or by letter within the next 1-3 weeks.  Please call us at [phone] if you have not heard about the biopsies in 3 weeks.    SIGNATURES/CONFIDENTIALITY: You and/or your care partner have signed paperwork which will be entered into your electronic medical record.  These signatures attest to the fact that that the information above on your After Visit Summary has been reviewed and is understood.  Full responsibility of the  confidentiality of this discharge information lies with you and/or your care-partner. 

## 2016-05-22 NOTE — Op Note (Signed)
[place] Patient Name: [name] Procedure Date: 05/22/2016 3:33 PM MRN: [id number] Endoscopist: Jerene Bears , MD Age: 75 Referring MD:  Date of Birth: May 26, 1941 Gender: Female Account #: [id number] Procedure:                Colonoscopy Indications:              Last colonoscopy: August 2008, Abnormal CT of the                            GI tract, Follow-up of colitis (felt to be                            ischemic) which has now improved Medicines:                Monitored Anesthesia Care Procedure:                Pre-Anesthesia Assessment:                           - Prior to the procedure, a History and Physical                            was performed, and patient medications and                            allergies were reviewed. The patient's tolerance of                            previous anesthesia was also reviewed. The risks                            and benefits of the procedure and the sedation                            options and risks were discussed with the patient.                            All questions were answered, and informed consent                            was obtained. Prior Anticoagulants: The patient has                            taken no previous anticoagulant or antiplatelet                            agents. ASA Grade Assessment: II - A patient with                            mild systemic disease. After reviewing the risks                            and benefits, the patient was deemed in  satisfactory condition to undergo the procedure.                           After obtaining informed consent, the colonoscope                            was passed under direct vision. Throughout the                            procedure, the patient's blood pressure, pulse, and                            oxygen saturations were monitored continuously. The                            Model PCF-H190L [id number]) scope  was introduced                            through the anus and advanced to the the terminal                            ileum. The colonoscopy was performed without                            difficulty. The patient tolerated the procedure                            well. The quality of the bowel preparation was                            good. The terminal ileum, ileocecal valve,                            appendiceal orifice, and rectum were photographed. Scope In: 3:44:21 PM Scope Out: 4:00:15 PM Scope Withdrawal Time: 0 hours 10 minutes 35 seconds  Total Procedure Duration: 0 hours 15 minutes 54 seconds  Findings:                 The terminal ileum appeared normal.                           The digital rectal exam was normal.                           Two sessile polyps were found in the ascending                            colon. The polyps were 4 to 6 mm in size. These                            polyps were removed with a cold snare. Resection                            and retrieval were complete.  Multiple small and large-mouthed diverticula were                            found in the sigmoid colon.                           The exam was otherwise without abnormality on                            direct and retroflexion views. Complications:            No immediate complications. Estimated Blood Loss:     Estimated blood loss was minimal. Impression:               - The examined portion of the ileum was normal.                           - Two 4 to 6 mm polyps in the ascending colon,                            removed with a cold snare. Resected and retrieved.                           - Severe diverticulosis in the sigmoid colon.                           - The examination was otherwise normal on direct                            and retroflexion views. No evidence of ongoing or                            persistent colitis. Recommendation:           -  Patient has a contact number available for                            emergencies. The signs and symptoms of potential                            delayed complications were discussed with the                            patient. Return to normal activities tomorrow.                            Written discharge instructions were provided to the                            patient.                           - Resume previous diet.                           - Continue present medications.                           -  Await pathology results.                           - Repeat colonoscopy is recommended. The                            colonoscopy date will be determined after pathology                            results from today's exam become available for                            review. Jerene Bears, MD 05/22/2016 4:06:04 PM This report has been signed electronically.

## 2016-05-23 ENCOUNTER — Telehealth: Payer: Self-pay

## 2016-05-23 NOTE — Telephone Encounter (Signed)
  Follow up Call-  Call back number 05/22/2016  Post procedure Call Back phone  # [phone]  Permission to leave phone message Yes     Patient questions:  Do you have a fever, pain , or abdominal swelling? No. Pain Score  0 *  Have you tolerated food without any problems? Yes.    Have you been able to return to your normal activities? Yes.    Do you have any questions about your discharge instructions: Diet   No. Medications  No. Follow up visit  No.  Do you have questions or concerns about your Care? No.  Actions: * If pain score is 4 or above: No action needed, pain <4.

## 2016-05-27 ENCOUNTER — Encounter: Payer: Self-pay | Admitting: Internal Medicine

## 2016-05-28 DIAGNOSIS — D1801 Hemangioma of skin and subcutaneous tissue: Secondary | ICD-10-CM | POA: Diagnosis not present

## 2016-05-28 DIAGNOSIS — L570 Actinic keratosis: Secondary | ICD-10-CM | POA: Diagnosis not present

## 2016-05-28 DIAGNOSIS — D225 Melanocytic nevi of trunk: Secondary | ICD-10-CM | POA: Diagnosis not present

## 2016-05-28 DIAGNOSIS — L821 Other seborrheic keratosis: Secondary | ICD-10-CM | POA: Diagnosis not present

## 2016-06-06 DIAGNOSIS — Z961 Presence of intraocular lens: Secondary | ICD-10-CM | POA: Diagnosis not present

## 2016-06-17 ENCOUNTER — Other Ambulatory Visit: Payer: Self-pay | Admitting: *Deleted

## 2016-06-17 DIAGNOSIS — C50411 Malignant neoplasm of upper-outer quadrant of right female breast: Secondary | ICD-10-CM

## 2016-06-18 ENCOUNTER — Other Ambulatory Visit (HOSPITAL_BASED_OUTPATIENT_CLINIC_OR_DEPARTMENT_OTHER): Payer: PPO

## 2016-06-18 ENCOUNTER — Ambulatory Visit (HOSPITAL_BASED_OUTPATIENT_CLINIC_OR_DEPARTMENT_OTHER): Payer: PPO | Admitting: Oncology

## 2016-06-18 ENCOUNTER — Telehealth: Payer: Self-pay | Admitting: Oncology

## 2016-06-18 ENCOUNTER — Ambulatory Visit (HOSPITAL_BASED_OUTPATIENT_CLINIC_OR_DEPARTMENT_OTHER): Payer: PPO

## 2016-06-18 VITALS — BP 115/73 | HR 79 | Temp 98.4°F | Resp 18 | Ht 61.0 in | Wt 160.2 lb

## 2016-06-18 DIAGNOSIS — C50411 Malignant neoplasm of upper-outer quadrant of right female breast: Secondary | ICD-10-CM

## 2016-06-18 DIAGNOSIS — M8580 Other specified disorders of bone density and structure, unspecified site: Secondary | ICD-10-CM

## 2016-06-18 LAB — CBC WITH DIFFERENTIAL/PLATELET
BASO%: 0.2 % (ref 0.0–2.0)
BASOS ABS: 0 10*3/uL (ref 0.0–0.1)
EOS ABS: 0.1 10*3/uL (ref 0.0–0.5)
EOS%: 2 % (ref 0.0–7.0)
HCT: 37.8 % (ref 34.8–46.6)
HGB: 12.7 g/dL (ref 11.6–15.9)
LYMPH%: 30.2 % (ref 14.0–49.7)
MCH: 30.5 pg (ref 25.1–34.0)
MCHC: 33.7 g/dL (ref 31.5–36.0)
MCV: 90.5 fL (ref 79.5–101.0)
MONO#: 0.4 10*3/uL (ref 0.1–0.9)
MONO%: 9.4 % (ref 0.0–14.0)
NEUT#: 2.7 10*3/uL (ref 1.5–6.5)
NEUT%: 58.2 % (ref 38.4–76.8)
Platelets: 320 10*3/uL (ref 145–400)
RBC: 4.17 10*6/uL (ref 3.70–5.45)
RDW: 13.5 % (ref 11.2–14.5)
WBC: 4.6 10*3/uL (ref 3.9–10.3)
lymph#: 1.4 10*3/uL (ref 0.9–3.3)

## 2016-06-18 LAB — COMPREHENSIVE METABOLIC PANEL
ALT: 21 U/L (ref 0–55)
AST: 22 U/L (ref 5–34)
Albumin: 3.5 g/dL (ref 3.5–5.0)
Alkaline Phosphatase: 77 U/L (ref 40–150)
Anion Gap: 9 mEq/L (ref 3–11)
BUN: 11.7 mg/dL (ref 7.0–26.0)
CHLORIDE: 103 meq/L (ref 98–109)
CO2: 25 meq/L (ref 22–29)
Calcium: 9.6 mg/dL (ref 8.4–10.4)
Creatinine: 0.8 mg/dL (ref 0.6–1.1)
EGFR: 74 mL/min/{1.73_m2} — AB (ref >90)
GLUCOSE: 83 mg/dL (ref 70–140)
POTASSIUM: 4.1 meq/L (ref 3.5–5.1)
SODIUM: 136 meq/L (ref 136–145)
Total Bilirubin: 0.55 mg/dL (ref 0.20–1.20)
Total Protein: 6.9 g/dL (ref 6.4–8.3)

## 2016-06-18 MED ORDER — DENOSUMAB 60 MG/ML ~~LOC~~ SOLN: 60.0000 mg | Freq: Once | SUBCUTANEOUS | 0 refills | Status: AC

## 2016-06-18 MED ORDER — DENOSUMAB 60 MG/ML ~~LOC~~ SOLN
60.0000 mg | Freq: Once | SUBCUTANEOUS | Status: AC
Start: 2016-06-18 — End: 2016-06-18
  Administered 2016-06-18: 60 mg via SUBCUTANEOUS
  Filled 2016-06-18: qty 1

## 2016-06-18 NOTE — Progress Notes (Signed)
[name]  [phone] [phone]     ID: [name] DOB: 1941/06/26  MR#: [id number]  YOV#:[id number]  Patient Care Team: Debbie Infante, MD as PCP - General (Internal Medicine) Debbie Koh MD; Debbie Hila MD, Debbie Martinique MD, [name] M.D., [name], DDS, Debbie Gamma MD  CHIEF COMPLAINT: Stage II breast cancer followup  CURRENT TREATMENT: Letrozole, denosumab  BREAST CANCER HISTORY: From the original intake note:  "[name]" had routine mammography at [facility] showing an area of abnormality in the right upper outer quadrant and an abnormal-looking right axillary lymph node. Both of these areas were biopsied 07/30/2010, with the pathology ([id number]) showing, in the breast, an invasive cancer with lobular features, estrogen receptor 97% positive, progesterone receptor negative, with an MIB-1 of 29% and no HER-2 amplification, the signals ratio being 1.00. Biopsy of the abnormal appearing lymph node was benign, but possibly discordant.  The patient then saw Dr. Clovis Sparks at [facility] and after appropriate discussion underwent right lumpectomy and sentinel lymph node sampling 09/04/2010. The pathology from this procedure ([id number]) showed an invasive lobular carcinoma, grade 2, measuring 3 cm. Margins were initially close, but additional tissue was obtained from the inferior and deep margin, showing no carcinoma. One out of 3 sentinel lymph nodes was involved by a 7 mm metastatic deposit.  An Oncotype was obtained from this procedure, and showed a recurrence score of 22, predicting a rate of distant recurrence of 14% if the patient's only systemic treatment was tamoxifen for 5 years.  [name] then saw [name] and he started her on "TAC" chemotherapy, namely cyclophosphamide, doxorubicin and docetaxel. This was initiated 10/15/2010 and was very poorly tolerated. There was a 25% dose reduction beginning with a second cycle and doxorubicin was  omitted from the sixth cycle. Despite this the patient had 2 admissions for febrile neutropenia and other complications. She switched her care to [name] and returned to [facility] to receive radiation therapy under [name]. This was concluded June 2012, and [name] was started on letrozole 04/24/2011.  Her subsequent history is as detailed below.  INTERVAL HISTORY: [name] returns today for follow-up of her estrogen receptor positive breast cancer. She continues on letrozole with good tolerance. Hot flashes and vaginal dryness are not a major issue. She never developed the arthralgias or myalgias that many patients can experience on this medication. She obtains it at a good price.  REVIEW OF SYSTEMS: [name] had a right trigger finger repaired in January and that seems to have gone well. She then traveled to [place] and came back with abdominal problems that lasted about 3 months. She had an interval treatment with [name] and [name] which she needed and finally had a CT scan in May which suggested possibly ischemic colitis. She had a colonoscopy under Dr. Hilarie Sparks and that did show a couple of polyps and she will need a repeat in 5 years. She also saw [name] recently and had a couple of skin lesions removed she does exercise on a regular basis, 5 days a week, and in general a detailed review of systems today was negative except as noted.  PAST MEDICAL HISTORY: Past Medical History:  Diagnosis Date  . Allergic rhinitis   . Breast cancer ([name]) 2011   right side  . Diverticulitis   . Endometriosis   . Hypertension     PAST SURGICAL HISTORY: Past Surgical History:  Procedure Laterality Date  . APPENDECTOMY  1988  . BACK  SURGERY  09/1999  . BREAST LUMPECTOMY Right 09/04/2010   w/ Sentinel Node Biopsy  . BREAST LUMPECTOMY  8/81962   Fibroadenoma. Left  . carpel tunnel Right last five years (04/19/15)  . CATARACT EXTRACTION W/ INTRAOCULAR LENS  IMPLANT, BILATERAL  Bilateral 05/15/16 and 05/09/16  . LIPOMA EXCISION  08/2002   L shoulder   . NASAL SINUS SURGERY  1994  . rectal fissure repair  11/1955  . TOTAL ABDOMINAL HYSTERECTOMY  1988  . TRIGGER FINGER RELEASE     x3    FAMILY HISTORY Family History  Problem Relation Age of Onset  . Heart disease Father   . Heart disease Mother   . Lung cancer Father     smoker  . Melanoma Daughter   The patient's father died at the age of 22 from lung cancer in the setting of tobacco abuse. The patient's mother died from congestive heart failure at the age of 32. She had had a valve problem from childhood. The patient's older daughter had a diagnosis of melanoma at age 84 there is a history of breast cancer on both sides of the family and a history of ovarian cancer in the patient's maternal grandmother. The patient herself however was tested at [facility] and does not carry a BRCA mutation.   GYNECOLOGIC HISTORY:  No LMP recorded. Patient has had a hysterectomy. Menarche age 22, first live birth age 39, the patient is [name] P2. She had a hysterectomy with bilateral salpingo-oophorectomy in 1987. She took hormone replacement for more than 20 years, finally stopping in 2009. 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 Her husband [name] is a retired Armed forces operational officer. The patient's older daughter, [name] had a history of depression and committed suicide at age 14, while working towards a Oceanographer in social work. The patient's younger daughter, [name], is a Conservation officer, historic buildings with prime care. The patient has 2 grandchildren, both in [place]. She attends [employer] .    ADVANCED DIRECTIVES: In place   HEALTH MAINTENANCE: Social History  Substance Use Topics  . Smoking status: Former Smoker    Packs/day: 1.00    Years: 10.00    Types: Cigarettes    Quit  date: 11/11/1978  . Smokeless tobacco: Never Used  . Alcohol use 1.2 oz/week    2 Glasses of wine per week     Colonoscopy: 2008/ [facility]  PAP: Status post hysterectomy  Bone density: T score of -0.6 (2009), -1.0 (2010), -1.2 (2013) at the spine, T score 0.3  (2013) at the left femoral neck  Lipid panel:  No Known Allergies  Current Outpatient Prescriptions  Medication Sig Dispense Refill  . Bromfenac Sodium (PROLENSA) 0.07 % SOLN Apply to eye.    Marland Kitchen buPROPion (WELLBUTRIN XL) 150 MG 24 hr tablet Take 150 mg by mouth daily.    Marland Kitchen CALCIUM & MAGNESIUM CARBONATES PO Take 1 tablet by mouth daily.    . [employer] SALINE NOSE SPRAY NA Place 1 spray into the nose 2 (two) times daily as needed. Nasal congestion.    . CYANOCOBALAMIN IJ Inject 1 vial as directed See admin instructions. Every three months. Patient had shot on 5/8.    Marland Kitchen CYANOCOBALAMIN PO Take 1 tablet by mouth daily.    . ergocalciferol (VITAMIN D2) 50000 UNITS capsule Take 50,000 Units by mouth once a week. Patient is due for next dose.    Marland Kitchen  fluticasone (FLONASE) 50 MCG/ACT nasal spray Place 2 sprays into the nose as needed.      . GuaiFENesin (MUCINEX PO) Take 600 mg by mouth every 12 (twelve) hours as needed. Congestion.    . hyoscyamine (LEVSIN/SL) 0.125 MG SL tablet Place 1-2 tablets (0.125-0.25 mg total) under the tongue every 4 (four) hours as needed for cramping. 30 tablet 0  . letrozole (FEMARA) 2.5 MG tablet TAKE 1 TABLET BY MOUTH DAILY 90 tablet 3  . naproxen sodium (ALEVE) 220 MG tablet Take 220 mg by mouth 2 (two) times daily as needed (leg pain).     . nebivolol (BYSTOLIC) 5 MG tablet Take 5 mg by mouth daily.      Marland Kitchen ofloxacin (OCUFLOX) 0.3 % ophthalmic solution Place 1 drop into both eyes 2 (two) times daily. For two weeks post cataract surgery    . ondansetron (ZOFRAN-ODT) 8 MG disintegrating tablet Take 8 mg by mouth every 8 (eight) hours as needed for nausea or vomiting.    . Probiotic Product (ALIGN) 4 MG CAPS Take 1 capsule  by mouth daily.    . ranitidine (ZANTAC) 150 MG tablet Take 150 mg by mouth daily.     No current facility-administered medications for this visit.     OBJECTIVE: Middle-aged white woman In no acute distress Vitals:   06/18/16 1015  BP: 115/73  Pulse: 79  Resp: 18  Temp: 98.4 F (36.9 C)     Body mass index is 30.27 kg/m.    ECOG FS:0 - Asymptomatic  Sclerae unicteric, EOMs intact Oropharynx clear and moist No cervical or supraclavicular adenopathy Lungs no rales or rhonchi Heart regular rate and rhythm Abd soft, nontender, positive bowel sounds MSK no focal spinal tenderness, no upper extremity lymphedema Neuro: nonfocal, well oriented, appropriate affect Breasts: The right breast is status post lumpectomy and radiation. There is no evidence of local recurrence. The right axilla is benign. The left breast is unremarkable.   LAB RESULTS:  CMP     Component Value Date/Time   NA 135 04/17/2016 1222   NA 137 10/17/2015 1230   K 4.4 04/17/2016 1222   K 4.4 10/17/2015 1230   CL 100 04/17/2016 1222   CO2 30 04/17/2016 1222   CO2 28 10/17/2015 1230   GLUCOSE 91 04/17/2016 1222   GLUCOSE 83 10/17/2015 1230   BUN 11 04/17/2016 1222   BUN 12.6 10/17/2015 1230   CREATININE 0.77 04/17/2016 1222   CREATININE 0.8 10/17/2015 1230   CALCIUM 9.4 04/17/2016 1222   CALCIUM 9.9 10/17/2015 1230   PROT 7.2 04/17/2016 1222   PROT 7.0 10/17/2015 1230   ALBUMIN 4.3 04/17/2016 1222   ALBUMIN 3.8 10/17/2015 1230   AST 19 04/17/2016 1222   AST 24 10/17/2015 1230   ALT 18 04/17/2016 1222   ALT 22 10/17/2015 1230   ALKPHOS 57 04/17/2016 1222   ALKPHOS 73 10/17/2015 1230   BILITOT 0.4 04/17/2016 1222   BILITOT 0.44 10/17/2015 1230   GFRNONAA >60 04/01/2016 0840   GFRAA >60 04/01/2016 0840    I No results found for: SPEP  Lab Results  Component Value Date   WBC 4.6 06/18/2016   NEUTROABS 2.7 06/18/2016   HGB 12.7 06/18/2016   HCT 37.8 06/18/2016   MCV 90.5 06/18/2016   PLT  320 06/18/2016      Chemistry      Component Value Date/Time   NA 135 04/17/2016 1222   NA 137 10/17/2015 1230   K 4.4 04/17/2016  1222   K 4.4 10/17/2015 1230   CL 100 04/17/2016 1222   CO2 30 04/17/2016 1222   CO2 28 10/17/2015 1230   BUN 11 04/17/2016 1222   BUN 12.6 10/17/2015 1230   CREATININE 0.77 04/17/2016 1222   CREATININE 0.8 10/17/2015 1230      Component Value Date/Time   CALCIUM 9.4 04/17/2016 1222   CALCIUM 9.9 10/17/2015 1230   ALKPHOS 57 04/17/2016 1222   ALKPHOS 73 10/17/2015 1230   AST 19 04/17/2016 1222   AST 24 10/17/2015 1230   ALT 18 04/17/2016 1222   ALT 22 10/17/2015 1230   BILITOT 0.4 04/17/2016 1222   BILITOT 0.44 10/17/2015 1230       No results found for: LABCA2  No components found for: LABCA125  No results for input(s): INR in the last 168 hours.  Urinalysis    Component Value Date/Time   COLORURINE YELLOW 04/17/2016 1222   APPEARANCEUR CLEAR 04/17/2016 1222   LABSPEC <=1.005 (A) 04/17/2016 1222   LABSPEC 1.025 01/17/2011 1523   PHURINE 6.5 04/17/2016 1222   GLUCOSEU NEGATIVE 04/17/2016 1222   HGBUR NEGATIVE 04/17/2016 1222   BILIRUBINUR NEGATIVE 04/17/2016 1222   BILIRUBINUR Negative 01/17/2011 1523   KETONESUR NEGATIVE 04/17/2016 1222   PROTEINUR 30 (A) 04/01/2016 0850   UROBILINOGEN 0.2 04/17/2016 1222   NITRITE NEGATIVE 04/17/2016 1222   LEUKOCYTESUR NEGATIVE 04/17/2016 1222   LEUKOCYTESUR Moderate 01/17/2011 1523    STUDIES: CLINICAL DATA:  Abdominal pain, rectal bleeding, vomiting, the patient took a stool softener 2 days ago  EXAM: CT ABDOMEN AND PELVIS WITH CONTRAST  TECHNIQUE: Multidetector CT imaging of the abdomen and pelvis was performed using the standard protocol following bolus administration of intravenous contrast.  CONTRAST:  137m ISOVUE-300 IOPAMIDOL (ISOVUE-300) INJECTION 61%  COMPARISON:  None.  FINDINGS: Lower chest:  Lung bases are unremarkable.  Small hiatal  hernia.  Hepatobiliary: Enhanced liver is unremarkable. No calcified gallstones are noted within gallbladder.  Pancreas: Enhanced pancreas is unremarkable.  Spleen: Enhanced spleen is unremarkable.  Adrenals/Urinary Tract: No adrenal gland mass. Enhanced kidneys are symmetrical in size. No hydronephrosis or hydroureter. Delayed renal images shows bilateral renal symmetrical excretion. Bilateral visualized proximal ureter is unremarkable. The urinary bladder is unremarkable.  Stomach/Bowel: No gastric outlet obstruction no small bowel obstruction. No thickened or dilated small bowel loops. Moderate colonic stool noted in right colon and proximal transverse colon. There is a low lying cecum. No pericecal inflammation. The patient is status post appendectomy. Scattered diverticula are noted descending colon. Multiple sigmoid colon diverticula are noted. Axial image 48 there is segmental thickening of left colons starting in splenic flexure. Mild stranding of pericolonic fat. This is best seen in coronal image 73. Findings are consistent with segmental colitis. No sigmoid colon colitis or diverticulitis. Trace free fluid noted within posterior pelvis. No distal colonic obstruction.  Vascular/Lymphatic: No aortic aneurysm. No retroperitoneal or mesenteric adenopathy.  Reproductive: The patient is status post hysterectomy.  Other: There is no ascites or free abdominal air.  Musculoskeletal: No destructive bony lesions are noted. Mild degenerative changes thoracolumbar spine. There is mild disc space flattening at L3-L4 and L4-L5 level. Significant disc space flattening with vacuum disc phenomenon at L5-S1 level. There is about 4 mm anterolisthesis L5 on S1 vertebral body. Question sclerotic bone island anterior aspect of L4 vertebral body. The  IMPRESSION: 1. There is segmental thickening of colonic wall in left colon and mild stranding of pericolonic fat. Findings are  highly suspicious for segmental  colitis. No pericolonic abscess. Scattered left colon diverticula without evidence of diverticular abscess. 2. Multiple sigmoid colon diverticula. No evidence of distal colitis or diverticulitis. 3. Moderate stool noted in right colon and proximal transverse colon. No pericecal inflammation. Appendix is surgically absent. 4. No hydronephrosis or hydroureter. 5. Status post hysterectomy. 6. Degenerative changes lumbar spine.   Electronically Signed   By: Lahoma Crocker M.D.   On: 04/01/2016 09:50  ASSESSMENT: 75 y.o. BRCA negative [place] woman s/p right upper outer quadrant lumpectomy and sentinel lymph node sampling 09/04/2010 for a pT2 pN1a, stage IIB invasive lobular carcinoma, grade 2, estrogen receptor 97% positive, progesterone receptor and HER-2 negative  (1) Oncotype score of 22 predicted a risk of outside the breast recurrence within 10 years of 14% if the patient's only adjuvant therapy was tamoxifen for 5 years.  (2) received adjuvant cyclophosphamide, doxorubicin and docetaxel x6 between December of 2011 and April of 2012, with a 25% dose reduction beginning with cycle 2 and doxorubicin held cycle 6  (3) completed adjuvant radiation to the right breast and axilla June 2012  (4) on letrozole as of 04/24/2011, plan is to continue it for 10 years  (a) DEXA scan 10/05/2014 showed osteopenia with a T score of -1.5  PLAN: [name] has had a rough first 6 months of this year but she seems to be weathering each problem as it comes along and after her colonoscopy, colitis, cataract surgery, trigger release surgery, and skin biopsies, she is pretty much back to normal.  The good news is that she is now 6 years out from definitive surgery for her breast cancer with no evidence of disease recurrence. This is very favorable.  The plan is to continue letrozole for total of 10 years. She is benefiting from the denosumab/Prolia and we will continue that  while she is on the letrozole. She did have a bone density she tells me within the past year and I do not have those results. We are requesting those from Dr. Silvestre Mesi office.  Otherwise she will return here in 6 months just for the denosumab and then again to see me in one year. She knows to call for any problems that may develop before that visit.  Chauncey Cruel, MD   06/18/2016 10:36 AM

## 2016-06-18 NOTE — Telephone Encounter (Signed)
appt made and avs printed °

## 2016-06-18 NOTE — Patient Instructions (Signed)
Denosumab injection  What is this medicine?  DENOSUMAB (den oh sue mab) slows bone breakdown. Prolia is used to treat osteoporosis in women after menopause and in men. Xgeva is used to prevent bone fractures and other bone problems caused by cancer bone metastases. Xgeva is also used to treat giant cell tumor of the bone.  This medicine may be used for other purposes; ask your health care provider or pharmacist if you have questions.  What should I tell my health care provider before I take this medicine?  They need to know if you have any of these conditions:  -dental disease  -eczema  -infection or history of infections  -kidney disease or on dialysis  -low blood calcium or vitamin D  -malabsorption syndrome  -scheduled to have surgery or tooth extraction  -taking medicine that contains denosumab  -thyroid or parathyroid disease  -an unusual reaction to denosumab, other medicines, foods, dyes, or preservatives  -pregnant or trying to get pregnant  -breast-feeding  How should I use this medicine?  This medicine is for injection under the skin. It is given by a health care professional in a hospital or clinic setting.  If you are getting Prolia, a special MedGuide will be given to you by the pharmacist with each prescription and refill. Be sure to read this information carefully each time.  For Prolia, talk to your pediatrician regarding the use of this medicine in children. Special care may be needed. For Xgeva, talk to your pediatrician regarding the use of this medicine in children. While this drug may be prescribed for children as young as 13 years for selected conditions, precautions do apply.  Overdosage: If you think you have taken too much of this medicine contact a poison control center or emergency room at once.  NOTE: This medicine is only for you. Do not share this medicine with others.  What if I miss a dose?  It is important not to miss your dose. Call your doctor or health care professional if you are  unable to keep an appointment.  What may interact with this medicine?  Do not take this medicine with any of the following medications:  -other medicines containing denosumab  This medicine may also interact with the following medications:  -medicines that suppress the immune system  -medicines that treat cancer  -steroid medicines like prednisone or cortisone  This list may not describe all possible interactions. Give your health care provider a list of all the medicines, herbs, non-prescription drugs, or dietary supplements you use. Also tell them if you smoke, drink alcohol, or use illegal drugs. Some items may interact with your medicine.  What should I watch for while using this medicine?  Visit your doctor or health care professional for regular checks on your progress. Your doctor or health care professional may order blood tests and other tests to see how you are doing.  Call your doctor or health care professional if you get a cold or other infection while receiving this medicine. Do not treat yourself. This medicine may decrease your body's ability to fight infection.  You should make sure you get enough calcium and vitamin D while you are taking this medicine, unless your doctor tells you not to. Discuss the foods you eat and the vitamins you take with your health care professional.  See your dentist regularly. Brush and floss your teeth as directed. Before you have any dental work done, tell your dentist you are receiving this medicine.  Do   not become pregnant while taking this medicine or for 5 months after stopping it. Women should inform their doctor if they wish to become pregnant or think they might be pregnant. There is a potential for serious side effects to an unborn child. Talk to your health care professional or pharmacist for more information.  What side effects may I notice from receiving this medicine?  Side effects that you should report to your doctor or health care professional as soon as  possible:  -allergic reactions like skin rash, itching or hives, swelling of the face, lips, or tongue  -breathing problems  -chest pain  -fast, irregular heartbeat  -feeling faint or lightheaded, falls  -fever, chills, or any other sign of infection  -muscle spasms, tightening, or twitches  -numbness or tingling  -skin blisters or bumps, or is dry, peels, or red  -slow healing or unexplained pain in the mouth or jaw  -unusual bleeding or bruising  Side effects that usually do not require medical attention (Report these to your doctor or health care professional if they continue or are bothersome.):  -muscle pain  -stomach upset, gas  This list may not describe all possible side effects. Call your doctor for medical advice about side effects. You may report side effects to FDA at 1-800-FDA-1088.  Where should I keep my medicine?  This medicine is only given in a clinic, doctor's office, or other health care setting and will not be stored at home.  NOTE: This sheet is a summary. It may not cover all possible information. If you have questions about this medicine, talk to your doctor, pharmacist, or health care provider.      2016, Elsevier/Gold Standard. (2012-04-27 12:37:47)

## 2016-07-05 ENCOUNTER — Telehealth: Payer: Self-pay | Admitting: Internal Medicine

## 2016-07-05 NOTE — Telephone Encounter (Signed)
Pt reports she is having right sided abdominal pain like she did several months ago. Requesting to be seen prior to going out of the country. Pt scheduled to see Dr. Hilarie Fredrickson 07/09/16@9am . Pt aware of appt.

## 2016-07-08 ENCOUNTER — Encounter: Payer: Self-pay | Admitting: *Deleted

## 2016-07-09 ENCOUNTER — Encounter: Payer: Self-pay | Admitting: Internal Medicine

## 2016-07-09 ENCOUNTER — Ambulatory Visit (INDEPENDENT_AMBULATORY_CARE_PROVIDER_SITE_OTHER): Payer: PPO | Admitting: Internal Medicine

## 2016-07-09 VITALS — BP 108/64 | HR 80 | Ht 61.0 in | Wt 158.6 lb

## 2016-07-09 DIAGNOSIS — K5731 Diverticulosis of large intestine without perforation or abscess with bleeding: Secondary | ICD-10-CM

## 2016-07-09 DIAGNOSIS — R109 Unspecified abdominal pain: Secondary | ICD-10-CM

## 2016-07-09 DIAGNOSIS — K5900 Constipation, unspecified: Secondary | ICD-10-CM | POA: Diagnosis not present

## 2016-07-09 MED ORDER — ONDANSETRON 8 MG PO TBDP: 8.0000 mg | ORAL_TABLET | Freq: Three times a day (TID) | ORAL | 1 refills | Status: DC | PRN

## 2016-07-09 MED ORDER — HYOSCYAMINE SULFATE 0.125 MG SL SUBL: 0.1250 mg | SUBLINGUAL_TABLET | Freq: Four times a day (QID) | SUBLINGUAL | 2 refills | Status: DC | PRN

## 2016-07-09 MED ORDER — CIPROFLOXACIN HCL 500 MG PO TABS: 500.0000 mg | ORAL_TABLET | Freq: Two times a day (BID) | ORAL | 0 refills | Status: DC

## 2016-07-09 NOTE — Progress Notes (Signed)
Subjective:    Patient ID: [name], female    DOB: 1941/03/14, 75 y.o.   MRN: [id number]  HPI [name] is a 75 year old female with history of ischemic colitis now resolved, colonic diverticulosis with remote diverticulitis, breast cancer, hypertension, and endometriosis that is post hysterectomy who is here for follow-up. She's here alone today. She had an episode of probable ischemic colitis as evidenced by symptoms and abnormal CT scan in May 2017. We followed this with colonoscopy performed on 05/22/2016. This showed a normal terminal ileum, 2 sessile polyps removed from the ascending colon, multiple diverticuli in the sigmoid colon and otherwise normal exam. The polyps are found to be 1 tubular adenoma and one sessile serrated polyp without dysplasia. Symptoms had improved dramatically though recently she developed left-sided pain.  She reports several weeks ago developing discomfort initially in the left upper quadrant moving to the left lower quadrant. This became a pain the next day and she relies she had not had a bowel movement in several days. She began MiraLAX and Levsin. Several days later she was able to have bowel movements 2-3 times per day which were soft but formed. Levsin helped the pain tremendously and currently the pain is gone. When it was bothering her it was worse at night when she was lying on her right side and most prominent under her left rib cage. She also used Advil for the pain which seemed to help though her daughter who is a Conservation officer, historic buildings recommended Tylenol rather than an NSAID. She denies blood in her stool or melena. She reports good appetite. Denies fever or chills. Incidentally she's noted her weight to have decreased by 5 pounds since July. She does have an upcoming trip to [place] where she will be doing a river cruise and she will still try to prevent an attack of ischemic colitis and pain while she is traveling.  Review of Systems As per  history of present illness, otherwise negative  Current Medications, Allergies, Past Medical History, Past Surgical History, Family History and Social History were reviewed in [employer] record.     Objective:   Physical Exam BP 108/64   Pulse 80   Ht 5\' 1"  (1.549 m)   Wt 158 lb 9.6 oz (71.9 kg)   BMI 29.97 kg/m  Constitutional: Well-developed and well-nourished. No distress. HEENT: Normocephalic and atraumatic.  Conjunctivae are normal.  No scleral icterus. Neck: Neck supple. Trachea midline. Cardiovascular: Normal rate, regular rhythm and intact distal pulses.  Pulmonary/chest: Effort normal and breath sounds normal. No wheezing, rales or rhonchi. Abdominal: Soft, nontender, nondistended. Bowel sounds active throughout.  Extremities: no clubbing, cyanosis, or edema Neurological: Alert and oriented to person place and time. Skin: Skin is warm and dry. No rashes noted. Psychiatric: Normal mood and affect. Behavior is normal.  CBC    Component Value Date/Time   WBC 4.6 06/18/2016 0943   WBC 5.3 04/17/2016 1222   RBC 4.17 06/18/2016 0943   RBC 4.47 04/17/2016 1222   HGB 12.7 06/18/2016 0943   HCT 37.8 06/18/2016 0943   PLT 320 06/18/2016 0943   MCV 90.5 06/18/2016 0943   MCH 30.5 06/18/2016 0943   MCH 30.5 04/01/2016 0840   MCHC 33.7 06/18/2016 0943   MCHC 33.7 04/17/2016 1222   RDW 13.5 06/18/2016 0943   LYMPHSABS 1.4 06/18/2016 0943   MONOABS 0.4 06/18/2016 0943   EOSABS 0.1 06/18/2016 0943   BASOSABS 0.0 06/18/2016 0943   CMP  Component Value Date/Time   NA 136 06/18/2016 0943   K 4.1 06/18/2016 0943   CL 100 04/17/2016 1222   CO2 25 06/18/2016 0943   GLUCOSE 83 06/18/2016 0943   BUN 11.7 06/18/2016 0943   CREATININE 0.8 06/18/2016 0943   CALCIUM 9.6 06/18/2016 0943   PROT 6.9 06/18/2016 0943   ALBUMIN 3.5 06/18/2016 0943   AST 22 06/18/2016 0943   ALT 21 06/18/2016 0943   ALKPHOS 77 06/18/2016 0943   BILITOT 0.55 06/18/2016  0943   GFRNONAA >60 04/01/2016 0840   GFRAA >60 04/01/2016 0840      Assessment & Plan:  75 year old female with history of ischemic colitis now resolved, colonic diverticulosis with remote diverticulitis, breast cancer, hypertension, and endometriosis that is post hysterectomy who is here for follow-up.   1. Left-sided abd pain/Colonic diverticulosis/history of ischemic colitis -- we discussed her symptoms at length today and I feel that her left-sided abdominal pain is likely due to mild constipation, colonic overdistention and spasm in the setting of her sigmoid diverticulosis. Her most recent episode is not consistent with her prior ischemic colitis. Of note segmental colitis associated with diverticulosis was not found at colonoscopy making this less likely. I feel the treatment will be to prevent constipation, therefore overfilling of the colon with distention and spasm. I recommended that she use MiraLAX half dose to one dose daily to keep bowel movements regular and occurring 1-3 times daily. She can use Levsin in the event of left-sided abdominal cramping or pain. For her upcoming trip I recommended that she travel with Cipro 500 mg twice a day 7 days should she develop more moderate to severe left-sided abdominal pain associated with fever or chills. She can also travel with Zofran 4 mg every 8 hours as needed for nausea. We discussed how Zofran can worsen constipation.  I recommended that she follow-up in November, sooner if necessary. I did provide her with my cell phone number in the event that she have questions or trouble while she is traveling.  25 minutes spent with the patient today. Greater than 50% was spent in counseling and coordination of care with the patient

## 2016-07-09 NOTE — Patient Instructions (Addendum)
We have sent the following medications to your pharmacy for you to pick up at your convenience: Levsin 1-2 tablets every 6 hours as needed Zofran 8 mcg every 8 hours as needed  Please purchase the following medications over the counter and take as directed: Miralax 1/2-1 capful once daily. May titrate as needed.  We have sent a prescription for Cipro 500 mg twice daily x 7 days to your pharmacy. You should only take this if needed while in [place].  Please follow up with Dr Hilarie Fredrickson on Wednesday, 09/18/16 @ 9:45 am.  If you are age 75 or older, your body mass index should be between 23-30. Your Body mass index is 29.97 kg/m. If this is out of the aforementioned range listed, please consider follow up with your Primary Care Provider.  If you are age 32 or younger, your body mass index should be between 19-25. Your Body mass index is 29.97 kg/m. If this is out of the aformentioned range listed, please consider follow up with your Primary Care Provider.

## 2016-07-10 DIAGNOSIS — E538 Deficiency of other specified B group vitamins: Secondary | ICD-10-CM | POA: Diagnosis not present

## 2016-08-26 DIAGNOSIS — M792 Neuralgia and neuritis, unspecified: Secondary | ICD-10-CM | POA: Diagnosis not present

## 2016-08-26 DIAGNOSIS — M5481 Occipital neuralgia: Secondary | ICD-10-CM | POA: Diagnosis not present

## 2016-08-29 DIAGNOSIS — H43391 Other vitreous opacities, right eye: Secondary | ICD-10-CM | POA: Diagnosis not present

## 2016-08-29 DIAGNOSIS — Z961 Presence of intraocular lens: Secondary | ICD-10-CM | POA: Diagnosis not present

## 2016-08-29 DIAGNOSIS — H43811 Vitreous degeneration, right eye: Secondary | ICD-10-CM | POA: Diagnosis not present

## 2016-09-02 DIAGNOSIS — Z6830 Body mass index (BMI) 30.0-30.9, adult: Secondary | ICD-10-CM | POA: Diagnosis not present

## 2016-09-02 DIAGNOSIS — R05 Cough: Secondary | ICD-10-CM | POA: Diagnosis not present

## 2016-09-18 ENCOUNTER — Ambulatory Visit (INDEPENDENT_AMBULATORY_CARE_PROVIDER_SITE_OTHER): Payer: PPO | Admitting: Internal Medicine

## 2016-09-18 ENCOUNTER — Encounter: Payer: Self-pay | Admitting: Internal Medicine

## 2016-09-18 ENCOUNTER — Encounter (INDEPENDENT_AMBULATORY_CARE_PROVIDER_SITE_OTHER): Payer: Self-pay

## 2016-09-18 VITALS — BP 112/70 | HR 80 | Ht 61.0 in | Wt 159.8 lb

## 2016-09-18 DIAGNOSIS — K5730 Diverticulosis of large intestine without perforation or abscess without bleeding: Secondary | ICD-10-CM | POA: Diagnosis not present

## 2016-09-18 DIAGNOSIS — K5900 Constipation, unspecified: Secondary | ICD-10-CM | POA: Diagnosis not present

## 2016-09-18 NOTE — Patient Instructions (Signed)
Please continue Miralax 1/2 dose daily.  Please continue Levsin as needed.  Follow up as needed with Dr Hilarie Fredrickson.  If you are age 75 or older, your body mass index should be between 23-30. Your Body mass index is 30.19 kg/m. If this is out of the aforementioned range listed, please consider follow up with your Primary Care Provider.  If you are age 55 or younger, your body mass index should be between 19-25. Your Body mass index is 30.19 kg/m. If this is out of the aformentioned range listed, please consider follow up with your Primary Care Provider.

## 2016-09-18 NOTE — Progress Notes (Signed)
   Subjective:    Patient ID: [name], female    DOB: 1941/06/22, 75 y.o.   MRN: [id number]  HPI [name] is a 75 year old female with history of ischemic colitis now resolved, chronic diverticulosis with remote diverticulitis, breast cancer, hypertension and endometriosis status post hysterectomy who is here for follow-up. She was last seen on 07/09/2016. She is here alone today. She reports she has been doing very well. She has been taking the MiraLAX half dose per day and as well as eating high fiber diet. She reports that this has controlled her symptoms "beautifully". Occasionally her stools can be a bit too loose but this is uncommon. She's had no bleeding, including no blood in her stool or melena. No abdominal pain. Eating well with a good appetite. She has recently had an occipital nerve block due to chronic pain which has worked and replaced her Lyrica and gabapentin. She has not needed Levsin or Zofran but has those available to use when necessary.  Her trip to [place] was wonderful and uneventful from a health perspective.  Review of Systems As per history of present illness, otherwise negative  Current Medications, Allergies, Past Medical History, Past Surgical History, Family History and Social History were reviewed in [employer] record.     Objective:   Physical Exam BP 112/70 (BP Location: Left Arm, Patient Position: Sitting, Cuff Size: Normal)   Pulse 80   Ht 5\' 1"  (1.549 m)   Wt 159 lb 12.8 oz (72.5 kg)   BMI 30.19 kg/m  Constitutional: Well-developed and well-nourished. No distress. HEENT: Normocephalic and atraumatic.  Conjunctivae are normal.  No scleral icterus. Neck: Neck supple. Trachea midline.  Cardiovascular: Normal rate, regular rhythm and intact distal pulses.  Pulmonary/chest: Effort normal and breath sounds normal. No wheezing, rales or rhonchi. Abdominal: Soft, nontender, nondistended. Bowel sounds active throughout. There are  no masses palpable. No hepatosplenomegaly. Extremities: no clubbing, cyanosis, or edema Neurological: Alert and oriented to person place and time. Skin: Skin is warm and dry Psychiatric: Normal mood and affect. Behavior is normal.    Assessment & Plan:  75 year old female with history of ischemic colitis now resolved, chronic diverticulosis with remote diverticulitis, breast cancer, hypertension and endometriosis status post hysterectomy who is here for follow-up.   1. Colonic diverticulosis/mild constipation/history of ischemic left-sided colitis now resolved -- she has done really well with a daily laxative without recurrent abdominal pain or evidence of ischemia. We reviewed symptoms and what to do should they occur. For now she will continue MiraLAX at half dose daily. She can use Levsin as needed for crampy abdominal pain which fortunately now is rare. She prefers to follow-up on an as-needed basis which is fine with me.  2. History of colon polyps -- colonoscopy earlier this year with five-year recall  15 minutes spent with the patient today. Greater than 50% was spent in counseling and coordination of care with the patient

## 2016-10-02 DIAGNOSIS — R05 Cough: Secondary | ICD-10-CM | POA: Diagnosis not present

## 2016-10-02 DIAGNOSIS — J029 Acute pharyngitis, unspecified: Secondary | ICD-10-CM | POA: Diagnosis not present

## 2016-10-02 DIAGNOSIS — R0609 Other forms of dyspnea: Secondary | ICD-10-CM | POA: Diagnosis not present

## 2016-10-02 DIAGNOSIS — Z6830 Body mass index (BMI) 30.0-30.9, adult: Secondary | ICD-10-CM | POA: Diagnosis not present

## 2016-10-02 DIAGNOSIS — J00 Acute nasopharyngitis [common cold]: Secondary | ICD-10-CM | POA: Diagnosis not present

## 2016-10-14 DIAGNOSIS — Z961 Presence of intraocular lens: Secondary | ICD-10-CM | POA: Diagnosis not present

## 2016-12-03 DIAGNOSIS — R0609 Other forms of dyspnea: Secondary | ICD-10-CM | POA: Diagnosis not present

## 2016-12-03 DIAGNOSIS — J069 Acute upper respiratory infection, unspecified: Secondary | ICD-10-CM | POA: Diagnosis not present

## 2016-12-03 DIAGNOSIS — J029 Acute pharyngitis, unspecified: Secondary | ICD-10-CM | POA: Diagnosis not present

## 2016-12-03 DIAGNOSIS — Z6828 Body mass index (BMI) 28.0-28.9, adult: Secondary | ICD-10-CM | POA: Diagnosis not present

## 2016-12-13 DIAGNOSIS — E860 Dehydration: Secondary | ICD-10-CM | POA: Diagnosis not present

## 2016-12-13 DIAGNOSIS — R112 Nausea with vomiting, unspecified: Secondary | ICD-10-CM | POA: Diagnosis not present

## 2016-12-13 DIAGNOSIS — J111 Influenza due to unidentified influenza virus with other respiratory manifestations: Secondary | ICD-10-CM | POA: Diagnosis not present

## 2016-12-13 DIAGNOSIS — E876 Hypokalemia: Secondary | ICD-10-CM | POA: Diagnosis not present

## 2016-12-13 DIAGNOSIS — I10 Essential (primary) hypertension: Secondary | ICD-10-CM | POA: Diagnosis not present

## 2016-12-16 ENCOUNTER — Telehealth: Payer: Self-pay | Admitting: Oncology

## 2016-12-16 NOTE — Telephone Encounter (Signed)
Pt called to r/s lab/inj appt to 2/21 at 1230 pm

## 2016-12-18 DIAGNOSIS — E559 Vitamin D deficiency, unspecified: Secondary | ICD-10-CM | POA: Diagnosis not present

## 2016-12-18 DIAGNOSIS — R8299 Other abnormal findings in urine: Secondary | ICD-10-CM | POA: Diagnosis not present

## 2016-12-18 DIAGNOSIS — I10 Essential (primary) hypertension: Secondary | ICD-10-CM | POA: Diagnosis not present

## 2016-12-18 DIAGNOSIS — E538 Deficiency of other specified B group vitamins: Secondary | ICD-10-CM | POA: Diagnosis not present

## 2016-12-18 DIAGNOSIS — Z0000 Encounter for general adult medical examination without abnormal findings: Secondary | ICD-10-CM | POA: Diagnosis not present

## 2016-12-19 ENCOUNTER — Ambulatory Visit: Payer: PPO

## 2016-12-19 ENCOUNTER — Other Ambulatory Visit: Payer: PPO

## 2016-12-25 DIAGNOSIS — R51 Headache: Secondary | ICD-10-CM | POA: Diagnosis not present

## 2016-12-25 DIAGNOSIS — M859 Disorder of bone density and structure, unspecified: Secondary | ICD-10-CM | POA: Diagnosis not present

## 2016-12-25 DIAGNOSIS — Z6830 Body mass index (BMI) 30.0-30.9, adult: Secondary | ICD-10-CM | POA: Diagnosis not present

## 2016-12-25 DIAGNOSIS — E538 Deficiency of other specified B group vitamins: Secondary | ICD-10-CM | POA: Diagnosis not present

## 2016-12-25 DIAGNOSIS — R945 Abnormal results of liver function studies: Secondary | ICD-10-CM | POA: Diagnosis not present

## 2016-12-25 DIAGNOSIS — I10 Essential (primary) hypertension: Secondary | ICD-10-CM | POA: Diagnosis not present

## 2016-12-25 DIAGNOSIS — Z1389 Encounter for screening for other disorder: Secondary | ICD-10-CM | POA: Diagnosis not present

## 2016-12-25 DIAGNOSIS — Z0000 Encounter for general adult medical examination without abnormal findings: Secondary | ICD-10-CM | POA: Diagnosis not present

## 2016-12-25 DIAGNOSIS — C50919 Malignant neoplasm of unspecified site of unspecified female breast: Secondary | ICD-10-CM | POA: Diagnosis not present

## 2016-12-25 DIAGNOSIS — R413 Other amnesia: Secondary | ICD-10-CM | POA: Diagnosis not present

## 2016-12-25 DIAGNOSIS — M542 Cervicalgia: Secondary | ICD-10-CM | POA: Diagnosis not present

## 2016-12-25 DIAGNOSIS — F329 Major depressive disorder, single episode, unspecified: Secondary | ICD-10-CM | POA: Diagnosis not present

## 2016-12-25 DIAGNOSIS — H9193 Unspecified hearing loss, bilateral: Secondary | ICD-10-CM | POA: Diagnosis not present

## 2017-01-01 ENCOUNTER — Other Ambulatory Visit: Payer: PPO

## 2017-01-01 ENCOUNTER — Ambulatory Visit (HOSPITAL_BASED_OUTPATIENT_CLINIC_OR_DEPARTMENT_OTHER): Payer: PPO

## 2017-01-01 VITALS — BP 117/66 | HR 83 | Temp 98.0°F | Resp 18

## 2017-01-01 DIAGNOSIS — C50411 Malignant neoplasm of upper-outer quadrant of right female breast: Secondary | ICD-10-CM

## 2017-01-01 DIAGNOSIS — M8580 Other specified disorders of bone density and structure, unspecified site: Secondary | ICD-10-CM

## 2017-01-01 MED ORDER — DENOSUMAB 60 MG/ML ~~LOC~~ SOLN
60.0000 mg | Freq: Once | SUBCUTANEOUS | Status: AC
  Administered 2017-01-01: 60 mg via SUBCUTANEOUS
  Filled 2017-01-01: qty 1

## 2017-01-01 NOTE — Patient Instructions (Signed)
Denosumab injection  What is this medicine?  DENOSUMAB (den oh sue mab) slows bone breakdown. Prolia is used to treat osteoporosis in women after menopause and in men. Xgeva is used to prevent bone fractures and other bone problems caused by cancer bone metastases. Xgeva is also used to treat giant cell tumor of the bone.  This medicine may be used for other purposes; ask your health care provider or pharmacist if you have questions.  What should I tell my health care provider before I take this medicine?  They need to know if you have any of these conditions:  -dental disease  -eczema  -infection or history of infections  -kidney disease or on dialysis  -low blood calcium or vitamin D  -malabsorption syndrome  -scheduled to have surgery or tooth extraction  -taking medicine that contains denosumab  -thyroid or parathyroid disease  -an unusual reaction to denosumab, other medicines, foods, dyes, or preservatives  -pregnant or trying to get pregnant  -breast-feeding  How should I use this medicine?  This medicine is for injection under the skin. It is given by a health care professional in a hospital or clinic setting.  If you are getting Prolia, a special MedGuide will be given to you by the pharmacist with each prescription and refill. Be sure to read this information carefully each time.  For Prolia, talk to your pediatrician regarding the use of this medicine in children. Special care may be needed. For Xgeva, talk to your pediatrician regarding the use of this medicine in children. While this drug may be prescribed for children as young as 13 years for selected conditions, precautions do apply.  Overdosage: If you think you have taken too much of this medicine contact a poison control center or emergency room at once.  NOTE: This medicine is only for you. Do not share this medicine with others.  What if I miss a dose?  It is important not to miss your dose. Call your doctor or health care professional if you are  unable to keep an appointment.  What may interact with this medicine?  Do not take this medicine with any of the following medications:  -other medicines containing denosumab  This medicine may also interact with the following medications:  -medicines that suppress the immune system  -medicines that treat cancer  -steroid medicines like prednisone or cortisone  This list may not describe all possible interactions. Give your health care provider a list of all the medicines, herbs, non-prescription drugs, or dietary supplements you use. Also tell them if you smoke, drink alcohol, or use illegal drugs. Some items may interact with your medicine.  What should I watch for while using this medicine?  Visit your doctor or health care professional for regular checks on your progress. Your doctor or health care professional may order blood tests and other tests to see how you are doing.  Call your doctor or health care professional if you get a cold or other infection while receiving this medicine. Do not treat yourself. This medicine may decrease your body's ability to fight infection.  You should make sure you get enough calcium and vitamin D while you are taking this medicine, unless your doctor tells you not to. Discuss the foods you eat and the vitamins you take with your health care professional.  See your dentist regularly. Brush and floss your teeth as directed. Before you have any dental work done, tell your dentist you are receiving this medicine.  Do   not become pregnant while taking this medicine or for 5 months after stopping it. Women should inform their doctor if they wish to become pregnant or think they might be pregnant. There is a potential for serious side effects to an unborn child. Talk to your health care professional or pharmacist for more information.  What side effects may I notice from receiving this medicine?  Side effects that you should report to your doctor or health care professional as soon as  possible:  -allergic reactions like skin rash, itching or hives, swelling of the face, lips, or tongue  -breathing problems  -chest pain  -fast, irregular heartbeat  -feeling faint or lightheaded, falls  -fever, chills, or any other sign of infection  -muscle spasms, tightening, or twitches  -numbness or tingling  -skin blisters or bumps, or is dry, peels, or red  -slow healing or unexplained pain in the mouth or jaw  -unusual bleeding or bruising  Side effects that usually do not require medical attention (Report these to your doctor or health care professional if they continue or are bothersome.):  -muscle pain  -stomach upset, gas  This list may not describe all possible side effects. Call your doctor for medical advice about side effects. You may report side effects to FDA at 1-800-FDA-1088.  Where should I keep my medicine?  This medicine is only given in a clinic, doctor's office, or other health care setting and will not be stored at home.  NOTE: This sheet is a summary. It may not cover all possible information. If you have questions about this medicine, talk to your doctor, pharmacist, or health care provider.      2016, Elsevier/Gold Standard. (2012-04-27 12:37:47)

## 2017-01-02 DIAGNOSIS — Z79811 Long term (current) use of aromatase inhibitors: Secondary | ICD-10-CM | POA: Diagnosis not present

## 2017-01-02 DIAGNOSIS — Z9011 Acquired absence of right breast and nipple: Secondary | ICD-10-CM | POA: Diagnosis not present

## 2017-01-02 DIAGNOSIS — Z170 Estrogen receptor positive status [ER+]: Secondary | ICD-10-CM | POA: Diagnosis not present

## 2017-01-02 DIAGNOSIS — C50911 Malignant neoplasm of unspecified site of right female breast: Secondary | ICD-10-CM | POA: Diagnosis not present

## 2017-01-02 DIAGNOSIS — R928 Other abnormal and inconclusive findings on diagnostic imaging of breast: Secondary | ICD-10-CM | POA: Diagnosis not present

## 2017-01-15 DIAGNOSIS — M859 Disorder of bone density and structure, unspecified: Secondary | ICD-10-CM | POA: Diagnosis not present

## 2017-01-23 DIAGNOSIS — H43393 Other vitreous opacities, bilateral: Secondary | ICD-10-CM | POA: Diagnosis not present

## 2017-02-19 DIAGNOSIS — L570 Actinic keratosis: Secondary | ICD-10-CM | POA: Diagnosis not present

## 2017-02-19 DIAGNOSIS — L738 Other specified follicular disorders: Secondary | ICD-10-CM | POA: Diagnosis not present

## 2017-02-25 DIAGNOSIS — Z6831 Body mass index (BMI) 31.0-31.9, adult: Secondary | ICD-10-CM | POA: Diagnosis not present

## 2017-02-25 DIAGNOSIS — M792 Neuralgia and neuritis, unspecified: Secondary | ICD-10-CM | POA: Diagnosis not present

## 2017-02-26 ENCOUNTER — Ambulatory Visit (INDEPENDENT_AMBULATORY_CARE_PROVIDER_SITE_OTHER): Payer: PPO | Admitting: Obstetrics & Gynecology

## 2017-02-26 ENCOUNTER — Encounter: Payer: Self-pay | Admitting: Obstetrics & Gynecology

## 2017-02-26 VITALS — BP 114/70

## 2017-02-26 DIAGNOSIS — M8588 Other specified disorders of bone density and structure, other site: Secondary | ICD-10-CM | POA: Diagnosis not present

## 2017-02-26 DIAGNOSIS — Z1272 Encounter for screening for malignant neoplasm of vagina: Secondary | ICD-10-CM | POA: Diagnosis not present

## 2017-02-26 DIAGNOSIS — Z01411 Encounter for gynecological examination (general) (routine) with abnormal findings: Secondary | ICD-10-CM | POA: Diagnosis not present

## 2017-02-26 DIAGNOSIS — F5231 Female orgasmic disorder: Secondary | ICD-10-CM | POA: Diagnosis not present

## 2017-02-26 DIAGNOSIS — N952 Postmenopausal atrophic vaginitis: Secondary | ICD-10-CM | POA: Diagnosis not present

## 2017-02-26 NOTE — Patient Instructions (Signed)
Your Annual/Gyn exam were normal except for Menopausal Atrophic Vaginitis.  A Pap test was done today and I will transmit the result to you as soon as available.  It was a pleasure to see you today!

## 2017-02-26 NOTE — Progress Notes (Signed)
[name] 1941/06/09 [id number]   History:    76 y.o.  G2P2L1 Married.  Established patient presenting for Aex/Gyn exam.  S/P TAH/BSO.  Menopause.  No HRT.  H/O Rt Breast Ca on Femara.  Osteopenia stable on BD 12/2016.  On Prolia preventively.  C/O long standing absence of orgasm.  No pain with IC.  On Valtrex currently for Zoster.  Past medical history,surgical history, family history and social history were all reviewed and documented in the EPIC chart.  Gynecologic History No LMP recorded. Patient has had a hysterectomy. Contraception: status post hysterectomy Last Pap: >5 yrs ago. Results were: normal Last mammogram:  2018. Results were: normal  Obstetric History OB History  Gravida Para Term Preterm AB Living  2 2       2   SAB TAB Ectopic Multiple Live Births               # Outcome Date GA Lbr Len/2nd Weight Sex Delivery Anes PTL Lv  2 Para           1 Para                ROS: A ROS was performed and pertinent positives and negatives are included in the history.  GENERAL: No fevers or chills. HEENT: No change in vision, no earache, sore throat or sinus congestion. NECK: No pain or stiffness. CARDIOVASCULAR: No chest pain or pressure. No palpitations. PULMONARY: No shortness of breath, cough or wheeze. GASTROINTESTINAL: No abdominal pain, nausea, vomiting or diarrhea, melena or bright red blood per rectum. GENITOURINARY: No urinary frequency, urgency, hesitancy or dysuria. MUSCULOSKELETAL: No joint or muscle pain, no back pain, no recent trauma. DERMATOLOGIC: No rash, no itching, no lesions. ENDOCRINE: No polyuria, polydipsia, no heat or cold intolerance. No recent change in weight. HEMATOLOGICAL: No anemia or easy bruising or bleeding. NEUROLOGIC: No headache, seizures, numbness, tingling or weakness. PSYCHIATRIC: No depression, no loss of interest in normal activity or change in sleep pattern.     Exam:   BP 114/70   There is no height or weight on file to  calculate BMI.  General appearance : Well developed well nourished female. No acute distress HEENT: Eyes: no retinal hemorrhage or exudates,  Neck supple, trachea midline, no carotid bruits, no thyroidmegaly Lungs: Clear to auscultation, no rhonchi or wheezes, or rib retractions  Heart: Regular rate and rhythm, no murmurs or gallops Breast:Examined in sitting and supine position were symmetrical in appearance, no palpable masses or tenderness,  no skin retraction, no nipple inversion, no nipple discharge, no skin discoloration, no axillary or supraclavicular lymphadenopathy Abdomen: no palpable masses or tenderness, no rebound or guarding Extremities: no edema or skin discoloration or tenderness  Pelvic:  Clitoris atrophic             Bartholin, Urethra, Skene Glands: Within normal limits             Vagina: No gross lesions or discharge.  Atrophic.  Pap reflex done.  Cervix: Absent  Uterus  Absent.  Adnexa  Without masses or tenderness  Anus and perineum  normal     Assessment/Plan:  76 y.o. female for annual exam.  1. Encounter for gynecological examination with abnormal finding Normal Aex/Gyn exam except for Atrophic Vaginitis.  Mammo/Breast Ca F/U Dr Jana Hakim.  BD Dr [name].  Up to date with [facility] 2017.  2. Post-menopausal atrophic vaginitis Not Sxic except for Anorgasmia  3. Osteopenia of lumbar spine On Prolia preventively.  Vit D supplement and wt bearing physical activity.  4. Special screening for malignant neoplasms, vagina  - Pap IG w/ reflex to HPV when ASC-U  5. 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  Insufficient Clitoral stimulation probably at origin of problem, but at this time situation worsen by Menopausal Clitoral atrophy.  H/O Breast Ca, Estrogen CI.  Clitoral stimulation recommended, will use [place] PRN.  If not successful, may call back for Testosterone cream.   Counseling on above issues >50% x 15 min. Princess Bruins MD, 2:18 PM 02/26/2017

## 2017-02-27 LAB — PAP IG W/ RFLX HPV ASCU

## 2017-03-21 ENCOUNTER — Other Ambulatory Visit: Payer: Self-pay | Admitting: Oncology

## 2017-03-31 DIAGNOSIS — Z170 Estrogen receptor positive status [ER+]: Secondary | ICD-10-CM | POA: Diagnosis not present

## 2017-03-31 DIAGNOSIS — C50911 Malignant neoplasm of unspecified site of right female breast: Secondary | ICD-10-CM | POA: Diagnosis not present

## 2017-04-25 DIAGNOSIS — Z87891 Personal history of nicotine dependence: Secondary | ICD-10-CM | POA: Diagnosis not present

## 2017-04-25 DIAGNOSIS — Z780 Asymptomatic menopausal state: Secondary | ICD-10-CM | POA: Diagnosis not present

## 2017-04-25 DIAGNOSIS — I10 Essential (primary) hypertension: Secondary | ICD-10-CM | POA: Diagnosis not present

## 2017-04-25 DIAGNOSIS — R413 Other amnesia: Secondary | ICD-10-CM | POA: Diagnosis not present

## 2017-04-25 DIAGNOSIS — Z79899 Other long term (current) drug therapy: Secondary | ICD-10-CM | POA: Diagnosis not present

## 2017-05-25 IMAGING — CT CT ABD-PELV W/ CM
2 of 5 series · 15 of 46 positions shown, 17 images · IV contrast (ISOVUE)
Comparison: None.

CLINICAL DATA: Abdominal pain, rectal bleeding, vomiting, the
patient took a stool softener 2 days ago

EXAM:
CT ABDOMEN AND PELVIS WITH CONTRAST
TECHNIQUE: Multidetector CT imaging of the abdomen and pelvis was performed
using the standard protocol following bolus administration of
intravenous contrast.
CONTRAST:  100mL 5T777B-2OO IOPAMIDOL (5T777B-2OO) INJECTION 61%

[Series 2: abd/pel with · axial · 0.74mm/px · z∈[+990,+1404]mm · 12 of 97 slices shown, 14 images]
[im 7/97  soft-tissue]
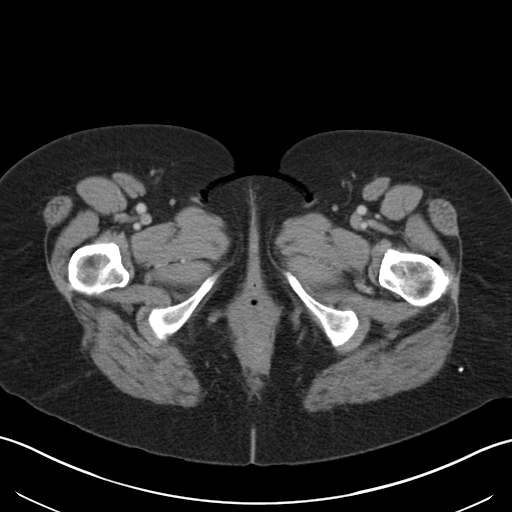
[im 7/97  bone]
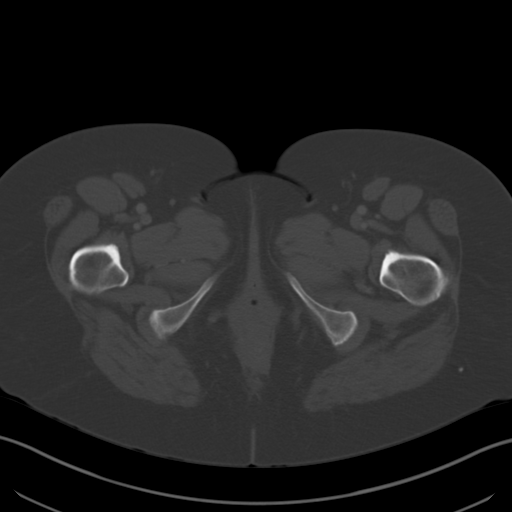
[im 13/97  soft-tissue]
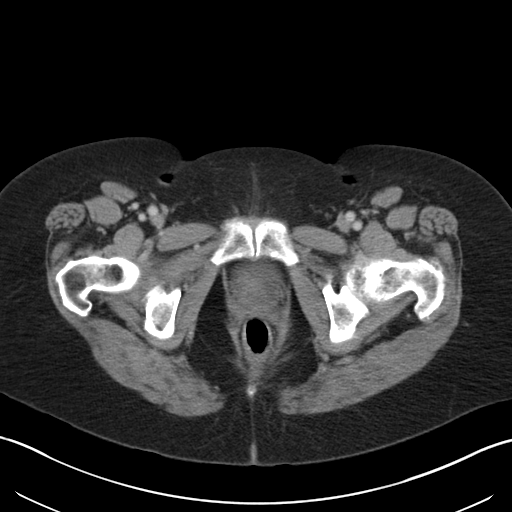
[im 20/97  soft-tissue]
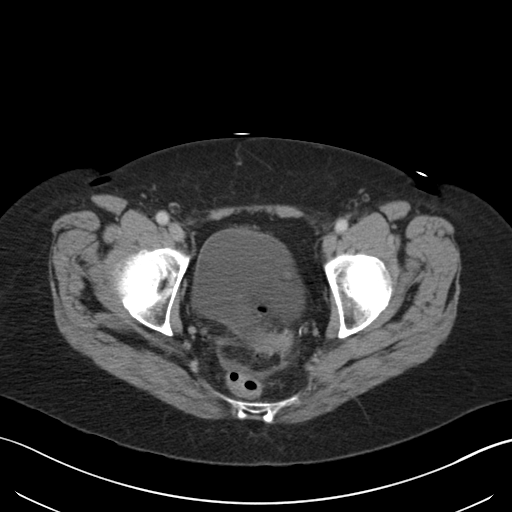
[im 33/97  soft-tissue]
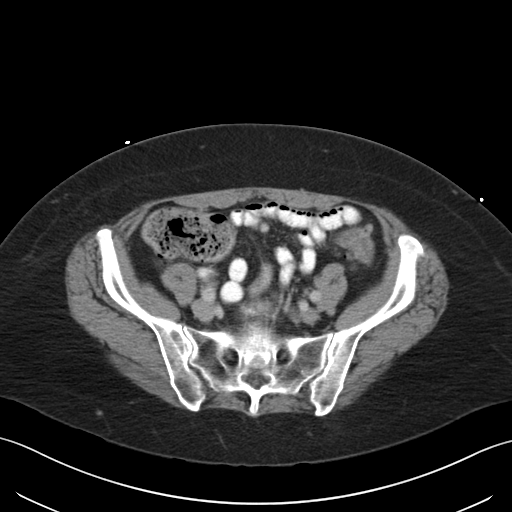
[im 39/97  soft-tissue]
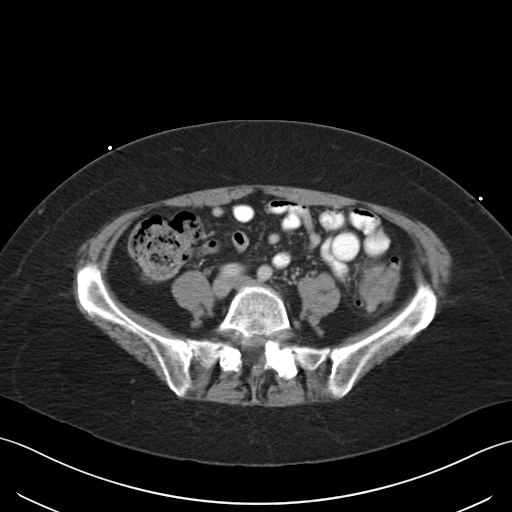
[im 45/97  soft-tissue]
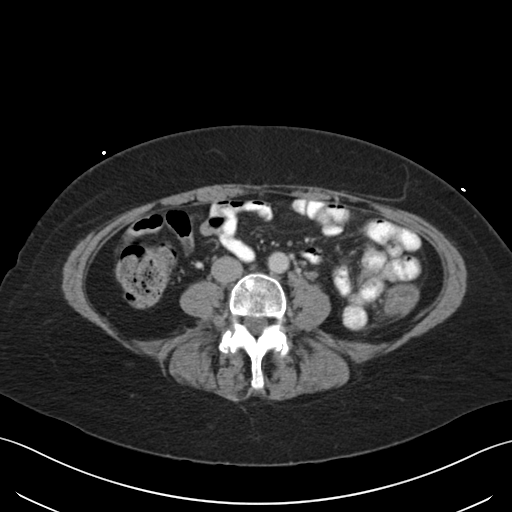
[im 52/97  soft-tissue]
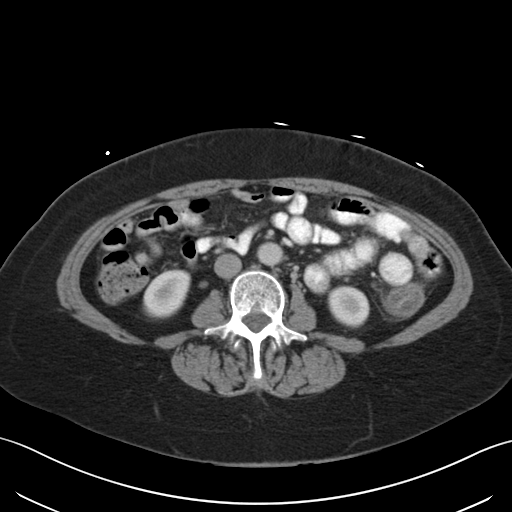
[im 58/97  soft-tissue]
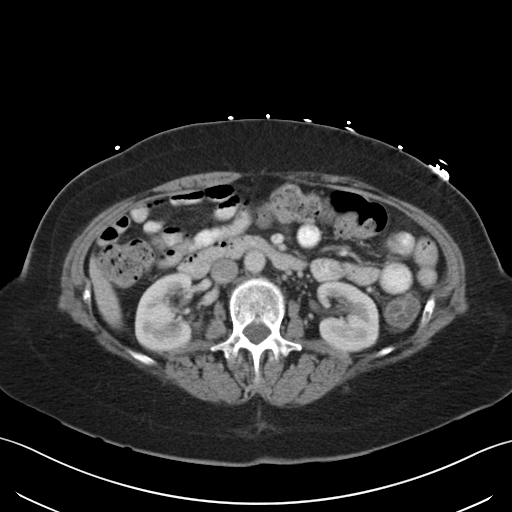
[im 65/97  soft-tissue]
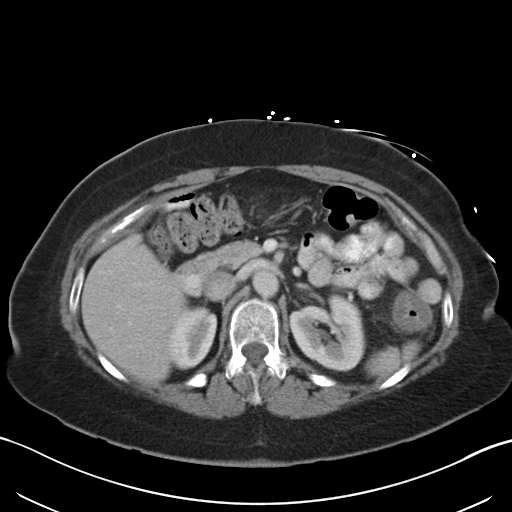
[im 65/97  bone]
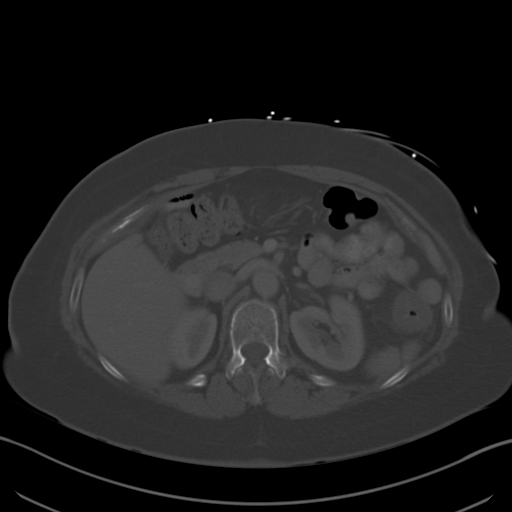
[im 77/97  soft-tissue]
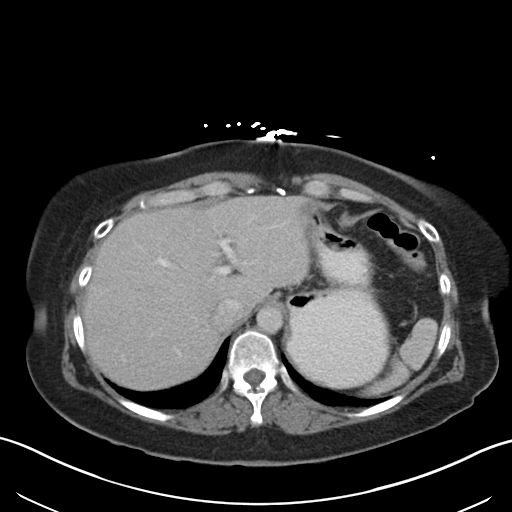
[im 84/97  soft-tissue]
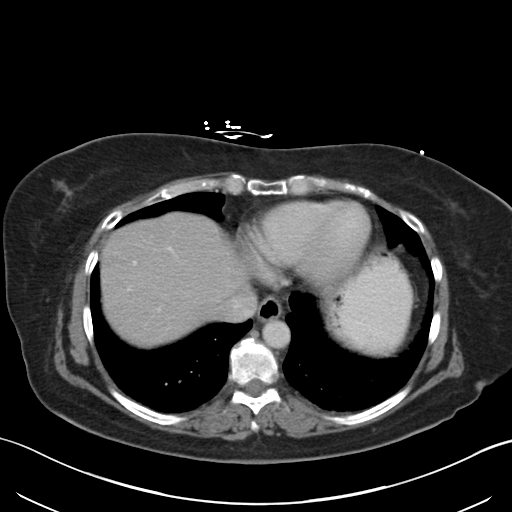
[im 90/97  soft-tissue]
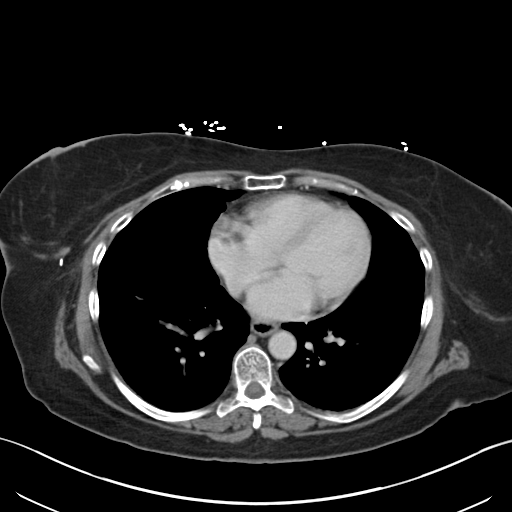

[Series 6: coronal a/|p · coronal · 0.74mm/px · 3 of 147 slices shown]
[im 49/147  soft-tissue]
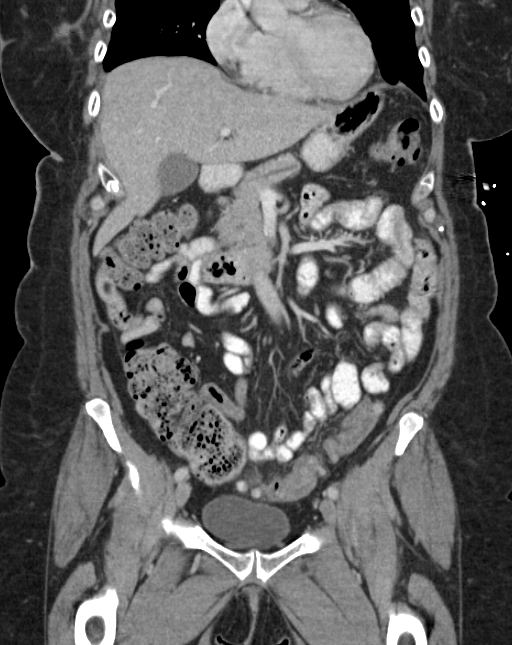
[im 65/147  soft-tissue]
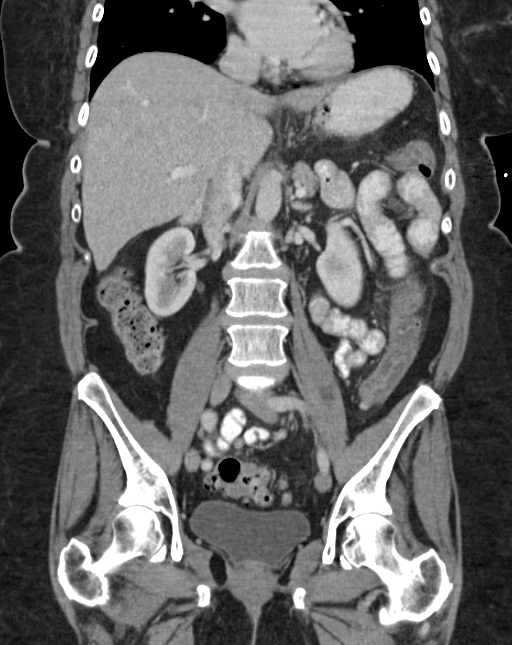
[im 82/147  soft-tissue]
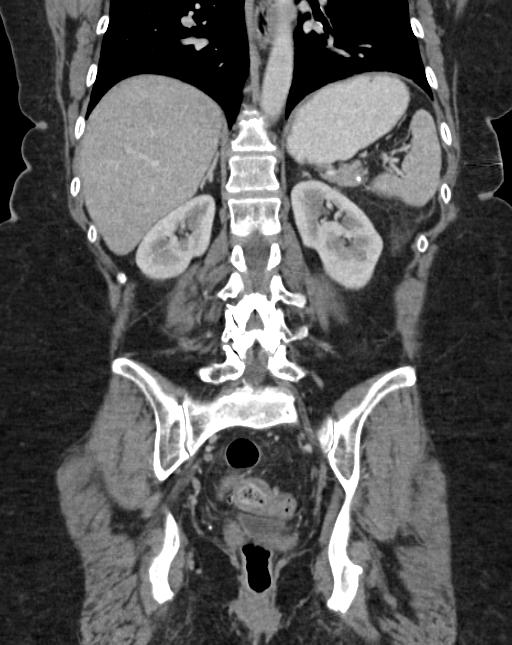

[15 of 46 positions shown; findings below may reference images not displayed]

FINDINGS: Lower chest:  Lung bases are unremarkable.  Small hiatal hernia.

Hepatobiliary: Enhanced liver is unremarkable. No calcified
gallstones are noted within gallbladder.

Pancreas: Enhanced pancreas is unremarkable.

Spleen: Enhanced spleen is unremarkable.

Adrenals/Urinary Tract: No adrenal gland mass. Enhanced kidneys are
symmetrical in size. No hydronephrosis or hydroureter. Delayed renal
images shows bilateral renal symmetrical excretion. Bilateral
visualized proximal ureter is unremarkable. The urinary bladder is
unremarkable.

Stomach/Bowel: No gastric outlet obstruction no small bowel
obstruction. No thickened or dilated small bowel loops. Moderate
colonic stool noted in right colon and proximal transverse colon.
There is a low lying cecum. No pericecal inflammation. The patient
is status post appendectomy. Scattered diverticula are noted
descending colon. Multiple sigmoid colon diverticula are noted.
Axial image 48 there is segmental thickening of left colons starting
in splenic flexure. Mild stranding of pericolonic fat. This is best
seen in coronal image 73. Findings are consistent with segmental
colitis. No sigmoid colon colitis or diverticulitis. Trace free
fluid noted within posterior pelvis. No distal colonic obstruction.

Vascular/Lymphatic: No aortic aneurysm. No retroperitoneal or
mesenteric adenopathy.

Reproductive: The patient is status post hysterectomy.

Other: There is no ascites or free abdominal air.

Musculoskeletal: No destructive bony lesions are noted. Mild
degenerative changes thoracolumbar spine. There is mild disc space
flattening at L3-L4 and L4-L5 level. Significant disc space
flattening with vacuum disc phenomenon at L5-S1 level. There is
about 4 mm anterolisthesis L5 on S1 vertebral body. Question
sclerotic bone island anterior aspect of L4 vertebral body. The
IMPRESSION: 1. There is segmental thickening of colonic wall in left colon and
mild stranding of pericolonic fat. Findings are highly suspicious
for segmental colitis. No pericolonic abscess. Scattered left colon
diverticula without evidence of diverticular abscess.
2. Multiple sigmoid colon diverticula. No evidence of distal colitis
or diverticulitis.
3. Moderate stool noted in right colon and proximal transverse
colon. No pericecal inflammation. Appendix is surgically absent.
4. No hydronephrosis or hydroureter.
5. Status post hysterectomy.
6. Degenerative changes lumbar spine.

## 2017-06-17 ENCOUNTER — Other Ambulatory Visit: Payer: Self-pay | Admitting: Emergency Medicine

## 2017-06-17 DIAGNOSIS — C50411 Malignant neoplasm of upper-outer quadrant of right female breast: Secondary | ICD-10-CM

## 2017-06-18 ENCOUNTER — Other Ambulatory Visit (HOSPITAL_BASED_OUTPATIENT_CLINIC_OR_DEPARTMENT_OTHER): Payer: PPO

## 2017-06-18 ENCOUNTER — Ambulatory Visit (HOSPITAL_BASED_OUTPATIENT_CLINIC_OR_DEPARTMENT_OTHER): Payer: PPO | Admitting: Oncology

## 2017-06-18 ENCOUNTER — Ambulatory Visit (HOSPITAL_BASED_OUTPATIENT_CLINIC_OR_DEPARTMENT_OTHER): Payer: PPO

## 2017-06-18 VITALS — BP 115/54 | HR 75 | Temp 97.6°F | Resp 18 | Ht 61.0 in | Wt 159.6 lb

## 2017-06-18 DIAGNOSIS — Z170 Estrogen receptor positive status [ER+]: Secondary | ICD-10-CM | POA: Diagnosis not present

## 2017-06-18 DIAGNOSIS — C50411 Malignant neoplasm of upper-outer quadrant of right female breast: Secondary | ICD-10-CM | POA: Diagnosis not present

## 2017-06-18 DIAGNOSIS — M8580 Other specified disorders of bone density and structure, unspecified site: Secondary | ICD-10-CM | POA: Diagnosis not present

## 2017-06-18 LAB — COMPREHENSIVE METABOLIC PANEL
ALT: 23 U/L (ref 0–55)
ANION GAP: 8 meq/L (ref 3–11)
AST: 26 U/L (ref 5–34)
Albumin: 3.6 g/dL (ref 3.5–5.0)
Alkaline Phosphatase: 66 U/L (ref 40–150)
BUN: 13.4 mg/dL (ref 7.0–26.0)
CHLORIDE: 101 meq/L (ref 98–109)
CO2: 25 meq/L (ref 22–29)
CREATININE: 0.8 mg/dL (ref 0.6–1.1)
Calcium: 9.5 mg/dL (ref 8.4–10.4)
EGFR: 72 mL/min/{1.73_m2} — ABNORMAL LOW (ref >90)
GLUCOSE: 83 mg/dL (ref 70–140)
Potassium: 3.9 mEq/L (ref 3.5–5.1)
SODIUM: 133 meq/L — AB (ref 136–145)
Total Bilirubin: 0.47 mg/dL (ref 0.20–1.20)
Total Protein: 6.7 g/dL (ref 6.4–8.3)

## 2017-06-18 LAB — CBC WITH DIFFERENTIAL/PLATELET
BASO%: 0.3 % (ref 0.0–2.0)
Basophils Absolute: 0 10*3/uL (ref 0.0–0.1)
EOS%: 1.8 % (ref 0.0–7.0)
Eosinophils Absolute: 0.1 10*3/uL (ref 0.0–0.5)
HCT: 36.7 % (ref 34.8–46.6)
HGB: 12.7 g/dL (ref 11.6–15.9)
LYMPH%: 27.4 % (ref 14.0–49.7)
MCH: 31.4 pg (ref 25.1–34.0)
MCHC: 34.5 g/dL (ref 31.5–36.0)
MCV: 90.9 fL (ref 79.5–101.0)
MONO#: 0.4 10*3/uL (ref 0.1–0.9)
MONO%: 6.9 % (ref 0.0–14.0)
NEUT#: 3.6 10*3/uL (ref 1.5–6.5)
NEUT%: 63.6 % (ref 38.4–76.8)
PLATELETS: 318 10*3/uL (ref 145–400)
RBC: 4.04 10*6/uL (ref 3.70–5.45)
RDW: 13.4 % (ref 11.2–14.5)
WBC: 5.6 10*3/uL (ref 3.9–10.3)
lymph#: 1.5 10*3/uL (ref 0.9–3.3)

## 2017-06-18 MED ORDER — DENOSUMAB 60 MG/ML ~~LOC~~ SOLN
60.0000 mg | Freq: Once | SUBCUTANEOUS | Status: AC
  Administered 2017-06-18: 60 mg via SUBCUTANEOUS
  Filled 2017-06-18: qty 1

## 2017-06-18 NOTE — Progress Notes (Signed)
[place]  Telephone:(336) [phone] Fax:(336) [phone]     ID: [name] DOB: Jun 06, 1941  MR#: [id number]  ZDG#:[id number]  Patient Care Team: Crist Infante, MD as PCP - General (Internal Medicine) Iran Planas, MD as Consulting Physician (Orthopedic Surgery) Martinique, Amy, MD as Consulting Physician (Dermatology) Pyrtle, Lajuan Lines, MD as Consulting Physician (Gastroenterology) Arloa Koh MD; Ronnette Hila MD, Amy Martinique MD, Baird Lyons M.D., Junie Bame, DDS, Jovita Gamma MD  CHIEF COMPLAINT: Stage II breast cancer followup  CURRENT TREATMENT: Letrozole, denosumab  BREAST CANCER HISTORY: From the original intake note:  "[name]" had routine mammography at [facility] showing an area of abnormality in the right upper outer quadrant and an abnormal-looking right axillary lymph node. Both of these areas were biopsied 07/30/2010, with the pathology ([id number]) showing, in the breast, an invasive cancer with lobular features, estrogen receptor 97% positive, progesterone receptor negative, with an MIB-1 of 29% and no HER-2 amplification, the signals ratio being 1.00. Biopsy of the abnormal appearing lymph node was benign, but possibly discordant.  The patient then saw Dr. Clovis Riley at [facility] and after appropriate discussion underwent right lumpectomy and sentinel lymph node sampling 09/04/2010. The pathology from this procedure ([id number]) showed an invasive lobular carcinoma, grade 2, measuring 3 cm. Margins were initially close, but additional tissue was obtained from the inferior and deep margin, showing no carcinoma. One out of 3 sentinel lymph nodes was involved by a 7 mm metastatic deposit.  An Oncotype was obtained from this procedure, and showed a recurrence score of 22, predicting a rate of distant recurrence of 14% if the patient's only systemic treatment was tamoxifen for 5 years.  [name] then saw [name] and he started her on "TAC" chemotherapy, namely  cyclophosphamide, doxorubicin and docetaxel. This was initiated 10/15/2010 and was very poorly tolerated. There was a 25% dose reduction beginning with a second cycle and doxorubicin was omitted from the sixth cycle. Despite this the patient had 2 admissions for febrile neutropenia and other complications. She switched her care to [facility] Eon and returned to [facility] to receive radiation therapy under Arloa Koh. This was concluded June 2012, and [name] was started on letrozole 04/24/2011.  Her subsequent history is as detailed below.  INTERVAL HISTORY: [name] returns today for follow-up and treatment of her estrogen receptor positive breast cancer. She continues on letrozole, with good tolerance. Hot flashes and vaginal dryness are not a major issue. She never developed the arthralgias or myalgias that many patients can experience on this medication. She obtains it at a good price.  She also receives denosumab every 6 months. She is tolerating this well. She takes extra calcium on the day of treatment on the following day.  Repeat bone density at her primary care physician's shows stable to slightly improved T score, currently at -1.3  REVIEW OF SYSTEMS: [name] does water exercises 5 days a week. She had an episode of possible shingles which was treated with Valtrex. Overall however she is doing "fine", with a detailed review of systems today otherwise negative.   PAST MEDICAL HISTORY: Past Medical History:  Diagnosis Date  . Allergic rhinitis   . Breast cancer ([place]) 2011   right side  . Diverticulitis   . Diverticulosis   . Endometriosis   . Hypertension   . Tubular adenoma of colon     PAST SURGICAL HISTORY: Past Surgical History:  Procedure Laterality Date  . APPENDECTOMY  1988  . BACK SURGERY  09/1999  . BREAST  LUMPECTOMY Right 09/04/2010   w/ Sentinel Node Biopsy  . BREAST LUMPECTOMY  8/81962   Fibroadenoma. Left  . carpel tunnel Right last five years (04/19/15)  .  CATARACT EXTRACTION W/ INTRAOCULAR LENS  IMPLANT, BILATERAL Bilateral 05/15/16 and 05/09/16  . COLONOSCOPY    . LIPOMA EXCISION  08/2002   L shoulder   . NASAL SINUS SURGERY  1994  . OCCIPITAL NEURECTOMY     Nerve block  . pvd Right   . rectal fissure repair  11/1955  . TOTAL ABDOMINAL HYSTERECTOMY  1988  . TRIGGER FINGER RELEASE     x3    FAMILY HISTORY Family History  Problem Relation Age of Onset  . Heart disease Father   . Lung cancer Father        smoker  . Heart attack Father   . Heart disease Mother   . Melanoma Daughter   The patient's father died at the age of 9 from lung cancer in the setting of tobacco abuse. The patient's mother died from congestive heart failure at the age of 68. She had had a valve problem from childhood. The patient's older daughter had a diagnosis of melanoma at age 75 there is a history of breast cancer on both sides of the family and a history of ovarian cancer in the patient's maternal grandmother. The patient herself however was tested at [facility] and does not carry a BRCA mutation.   GYNECOLOGIC HISTORY:  No LMP recorded. Patient has had a hysterectomy. Menarche age 78, first live birth age 2, the patient is [place] P2. She had a hysterectomy with bilateral salpingo-oophorectomy in 1987. She took hormone replacement for more than 20 years, finally stopping in 2009. 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 Her husband [name] "[name]" Huser is a retired Armed forces operational officer. The patient's older daughter, [name] had a history of depression and committed suicide at age 83, while working towards a Oceanographer in social work. The patient's younger daughter, [name], is a Conservation officer, historic buildings with prime care. The patient has 2 grandchildren, both in [place]. She attends [employer] .    ADVANCED DIRECTIVES: In  place   HEALTH MAINTENANCE: Social History  Substance Use Topics  . Smoking status: Former Smoker    Packs/day: 1.00    Years: 10.00    Types: Cigarettes    Quit date: 11/11/1978  . Smokeless tobacco: Never Used  . Alcohol use No     Colonoscopy: 2008/ [facility]  PAP: Status post hysterectomy  Bone density: T score of -0.6 (2009), -1.0 (2010), -1.2 (2013) at the spine, T score 0.3  (2013) at the left femoral neck  Lipid panel:  No Known Allergies  Current Outpatient Prescriptions  Medication Sig Dispense Refill  . buPROPion (WELLBUTRIN XL) 150 MG 24 hr tablet Take 150 mg by mouth daily.    Marland Kitchen CALCIUM & MAGNESIUM CARBONATES PO Take 1 tablet by mouth daily.    . [employer] SALINE NOSE SPRAY NA Place 1 spray into the nose 2 (two) times daily as needed. Nasal congestion.    . CYANOCOBALAMIN IJ Inject 1 vial as directed See admin instructions. Every three months. Patient had shot on 5/8.    Marland Kitchen CYANOCOBALAMIN PO Take 1 tablet by mouth daily.    Marland Kitchen denosumab (PROLIA) 60 MG/ML SOLN injection Inject 60 mg into the skin every 6 (six) months. Administer in upper arm,  thigh, or abdomen    . fluticasone (FLONASE) 50 MCG/ACT nasal spray Place 2 sprays into the nose as needed.      Marland Kitchen letrozole (FEMARA) 2.5 MG tablet TAKE 1 TABLET BY MOUTH DAILY 90 tablet 3  . nebivolol (BYSTOLIC) 5 MG tablet Take 5 mg by mouth daily.      . ondansetron (ZOFRAN-ODT) 8 MG disintegrating tablet Take 1 tablet (8 mg total) by mouth every 8 (eight) hours as needed for nausea or vomiting. 20 tablet 1  . polyethylene glycol (MIRALAX / GLYCOLAX) packet Take 17 g by mouth daily.    . valACYclovir (VALTREX) 500 MG tablet Take 500 mg by mouth 2 (two) times daily.     No current facility-administered medications for this visit.     OBJECTIVE: Middle-aged white woman Who appears well  Vitals:   06/18/17 1318  BP: (!) 115/54  Pulse: 75  Resp: 18  Temp: 97.6 F (36.4 C)     Body mass index is 30.16 kg/m.    ECOG FS:0 -  Asymptomatic  Sclerae unicteric, pupils round and equal Oropharynx clear and moist No cervical or supraclavicular adenopathy Lungs no rales or rhonchi Heart regular rate and rhythm Abd soft, nontender, positive bowel sounds MSK no focal spinal tenderness, no upper extremity lymphedema Neuro: nonfocal, well oriented, appropriate affect Breasts: The right breast is undergone lumpectomy followed by radiation with no evidence of local recurrence. The left breast is benign. Both axillae are benign.  LAB RESULTS:  CMP     Component Value Date/Time   NA 136 06/18/2016 0943   K 4.1 06/18/2016 0943   CL 100 04/17/2016 1222   CO2 25 06/18/2016 0943   GLUCOSE 83 06/18/2016 0943   BUN 11.7 06/18/2016 0943   CREATININE 0.8 06/18/2016 0943   CALCIUM 9.6 06/18/2016 0943   PROT 6.9 06/18/2016 0943   ALBUMIN 3.5 06/18/2016 0943   AST 22 06/18/2016 0943   ALT 21 06/18/2016 0943   ALKPHOS 77 06/18/2016 0943   BILITOT 0.55 06/18/2016 0943   GFRNONAA >60 04/01/2016 0840   GFRAA >60 04/01/2016 0840    I No results found for: SPEP  Lab Results  Component Value Date   WBC 5.6 06/18/2017   NEUTROABS 3.6 06/18/2017   HGB 12.7 06/18/2017   HCT 36.7 06/18/2017   MCV 90.9 06/18/2017   PLT 318 06/18/2017      Chemistry      Component Value Date/Time   NA 136 06/18/2016 0943   K 4.1 06/18/2016 0943   CL 100 04/17/2016 1222   CO2 25 06/18/2016 0943   BUN 11.7 06/18/2016 0943   CREATININE 0.8 06/18/2016 0943      Component Value Date/Time   CALCIUM 9.6 06/18/2016 0943   ALKPHOS 77 06/18/2016 0943   AST 22 06/18/2016 0943   ALT 21 06/18/2016 0943   BILITOT 0.55 06/18/2016 0943       No results found for: LABCA2  No components found for: [id number]  No results for input(s): INR in the last 168 hours.  Urinalysis    Component Value Date/Time   COLORURINE YELLOW 04/17/2016 1222   APPEARANCEUR CLEAR 04/17/2016 1222   LABSPEC <=1.005 (A) 04/17/2016 1222   LABSPEC 1.025  01/17/2011 1523   PHURINE 6.5 04/17/2016 1222   GLUCOSEU NEGATIVE 04/17/2016 1222   HGBUR NEGATIVE 04/17/2016 1222   BILIRUBINUR NEGATIVE 04/17/2016 1222   BILIRUBINUR Negative 01/17/2011 1523   KETONESUR NEGATIVE 04/17/2016 1222   PROTEINUR 30 (A) 04/01/2016 0850  UROBILINOGEN 0.2 04/17/2016 1222   NITRITE NEGATIVE 04/17/2016 1222   LEUKOCYTESUR NEGATIVE 04/17/2016 1222   LEUKOCYTESUR Moderate 01/17/2011 1523    STUDIES: Mammography and bone density studies discussed   ASSESSMENT: 76 y.o. BRCA negative [place] woman s/p right upper outer quadrant lumpectomy and sentinel lymph node sampling 09/04/2010 for a pT2 pN1a, stage IIB invasive lobular carcinoma, grade 2, estrogen receptor 97% positive, progesterone receptor and HER-2 negative  (1) Oncotype score of 22 predicted a risk of outside the breast recurrence within 10 years of 14% if the patient's only adjuvant therapy was tamoxifen for 5 years.  (2) received adjuvant cyclophosphamide, doxorubicin and docetaxel x6 between December of 2011 and April of 2012, with a 25% dose reduction beginning with cycle 2 and doxorubicin held cycle 6  (3) completed adjuvant radiation to the right breast and axilla June 2012  (4) on letrozole as of 04/24/2011, plan is to continue it for 10 years  (a) DEXA scan 10/05/2014 showed osteopenia with a T score of -1.5  (b) repeat DEXA scan at [facility] 12/11/2015 showed a T score of -1.3.  (c) denosumab/Prolia started 10/17/2015.  PLAN: [name] is now just about 6 years out from definitive surgery for her breast cancer with no evidence of disease recurrence. This is very favorable.  She continues on letrozole, with good tolerance. We discussed the recent data that shows that 7 years of letrozole is equivalent to 10 in terms of risk reduction. Accordingly the current standard in patients who want to take more than 5 years is a total of 7 years. She understands the benefit is small, in  the 1-3 percentage risk reduction range.  However this is motivating to her. Accordingly we are going to continue the letrozole and additional 2 years.  While she is on letrozole we can continue her denosumab. She will receive a dose today, and 6 months from now, then again when she returns to see me in one year.  We discussed her repeat bone density scan which shows stable to slightly improved T-scores  She knows to call for any other problems that may develop before her next visit here.  Chauncey Cruel, MD   06/18/2017 1:23 PM

## 2017-06-18 NOTE — Patient Instructions (Signed)
Denosumab injection  What is this medicine?  DENOSUMAB (den oh sue mab) slows bone breakdown. Prolia is used to treat osteoporosis in women after menopause and in men. Xgeva is used to prevent bone fractures and other bone problems caused by cancer bone metastases. Xgeva is also used to treat giant cell tumor of the bone.  This medicine may be used for other purposes; ask your health care provider or pharmacist if you have questions.  What should I tell my health care provider before I take this medicine?  They need to know if you have any of these conditions:  -dental disease  -eczema  -infection or history of infections  -kidney disease or on dialysis  -low blood calcium or vitamin D  -malabsorption syndrome  -scheduled to have surgery or tooth extraction  -taking medicine that contains denosumab  -thyroid or parathyroid disease  -an unusual reaction to denosumab, other medicines, foods, dyes, or preservatives  -pregnant or trying to get pregnant  -breast-feeding  How should I use this medicine?  This medicine is for injection under the skin. It is given by a health care professional in a hospital or clinic setting.  If you are getting Prolia, a special MedGuide will be given to you by the pharmacist with each prescription and refill. Be sure to read this information carefully each time.  For Prolia, talk to your pediatrician regarding the use of this medicine in children. Special care may be needed. For Xgeva, talk to your pediatrician regarding the use of this medicine in children. While this drug may be prescribed for children as young as 13 years for selected conditions, precautions do apply.  Overdosage: If you think you have taken too much of this medicine contact a poison control center or emergency room at once.  NOTE: This medicine is only for you. Do not share this medicine with others.  What if I miss a dose?  It is important not to miss your dose. Call your doctor or health care professional if you are  unable to keep an appointment.  What may interact with this medicine?  Do not take this medicine with any of the following medications:  -other medicines containing denosumab  This medicine may also interact with the following medications:  -medicines that suppress the immune system  -medicines that treat cancer  -steroid medicines like prednisone or cortisone  This list may not describe all possible interactions. Give your health care provider a list of all the medicines, herbs, non-prescription drugs, or dietary supplements you use. Also tell them if you smoke, drink alcohol, or use illegal drugs. Some items may interact with your medicine.  What should I watch for while using this medicine?  Visit your doctor or health care professional for regular checks on your progress. Your doctor or health care professional may order blood tests and other tests to see how you are doing.  Call your doctor or health care professional if you get a cold or other infection while receiving this medicine. Do not treat yourself. This medicine may decrease your body's ability to fight infection.  You should make sure you get enough calcium and vitamin D while you are taking this medicine, unless your doctor tells you not to. Discuss the foods you eat and the vitamins you take with your health care professional.  See your dentist regularly. Brush and floss your teeth as directed. Before you have any dental work done, tell your dentist you are receiving this medicine.  Do   not become pregnant while taking this medicine or for 5 months after stopping it. Women should inform their doctor if they wish to become pregnant or think they might be pregnant. There is a potential for serious side effects to an unborn child. Talk to your health care professional or pharmacist for more information.  What side effects may I notice from receiving this medicine?  Side effects that you should report to your doctor or health care professional as soon as  possible:  -allergic reactions like skin rash, itching or hives, swelling of the face, lips, or tongue  -breathing problems  -chest pain  -fast, irregular heartbeat  -feeling faint or lightheaded, falls  -fever, chills, or any other sign of infection  -muscle spasms, tightening, or twitches  -numbness or tingling  -skin blisters or bumps, or is dry, peels, or red  -slow healing or unexplained pain in the mouth or jaw  -unusual bleeding or bruising  Side effects that usually do not require medical attention (Report these to your doctor or health care professional if they continue or are bothersome.):  -muscle pain  -stomach upset, gas  This list may not describe all possible side effects. Call your doctor for medical advice about side effects. You may report side effects to FDA at 1-800-FDA-1088.  Where should I keep my medicine?  This medicine is only given in a clinic, doctor's office, or other health care setting and will not be stored at home.  NOTE: This sheet is a summary. It may not cover all possible information. If you have questions about this medicine, talk to your doctor, pharmacist, or health care provider.      2016, Elsevier/Gold Standard. (2012-04-27 12:37:47)

## 2017-08-11 DIAGNOSIS — D225 Melanocytic nevi of trunk: Secondary | ICD-10-CM | POA: Diagnosis not present

## 2017-08-11 DIAGNOSIS — L821 Other seborrheic keratosis: Secondary | ICD-10-CM | POA: Diagnosis not present

## 2017-08-11 DIAGNOSIS — L570 Actinic keratosis: Secondary | ICD-10-CM | POA: Diagnosis not present

## 2017-08-12 DIAGNOSIS — E538 Deficiency of other specified B group vitamins: Secondary | ICD-10-CM | POA: Diagnosis not present

## 2017-10-16 DIAGNOSIS — H5213 Myopia, bilateral: Secondary | ICD-10-CM | POA: Diagnosis not present

## 2017-10-16 DIAGNOSIS — H524 Presbyopia: Secondary | ICD-10-CM | POA: Diagnosis not present

## 2017-10-16 DIAGNOSIS — H52203 Unspecified astigmatism, bilateral: Secondary | ICD-10-CM | POA: Diagnosis not present

## 2017-11-16 DIAGNOSIS — N3001 Acute cystitis with hematuria: Secondary | ICD-10-CM | POA: Diagnosis not present

## 2017-11-16 DIAGNOSIS — R300 Dysuria: Secondary | ICD-10-CM | POA: Diagnosis not present

## 2017-11-20 DIAGNOSIS — N390 Urinary tract infection, site not specified: Secondary | ICD-10-CM | POA: Diagnosis not present

## 2017-11-20 DIAGNOSIS — I10 Essential (primary) hypertension: Secondary | ICD-10-CM | POA: Diagnosis not present

## 2017-11-20 DIAGNOSIS — Z6830 Body mass index (BMI) 30.0-30.9, adult: Secondary | ICD-10-CM | POA: Diagnosis not present

## 2017-11-30 DIAGNOSIS — N3001 Acute cystitis with hematuria: Secondary | ICD-10-CM | POA: Diagnosis not present

## 2017-12-05 DIAGNOSIS — N3020 Other chronic cystitis without hematuria: Secondary | ICD-10-CM | POA: Diagnosis not present

## 2017-12-15 ENCOUNTER — Telehealth: Payer: Self-pay | Admitting: Oncology

## 2017-12-15 DIAGNOSIS — Z79811 Long term (current) use of aromatase inhibitors: Secondary | ICD-10-CM | POA: Diagnosis not present

## 2017-12-15 DIAGNOSIS — Z08 Encounter for follow-up examination after completed treatment for malignant neoplasm: Secondary | ICD-10-CM | POA: Diagnosis not present

## 2017-12-15 DIAGNOSIS — Z87891 Personal history of nicotine dependence: Secondary | ICD-10-CM | POA: Diagnosis not present

## 2017-12-15 DIAGNOSIS — Z170 Estrogen receptor positive status [ER+]: Secondary | ICD-10-CM | POA: Diagnosis not present

## 2017-12-15 DIAGNOSIS — C50911 Malignant neoplasm of unspecified site of right female breast: Secondary | ICD-10-CM | POA: Diagnosis not present

## 2017-12-15 DIAGNOSIS — Z853 Personal history of malignant neoplasm of breast: Secondary | ICD-10-CM | POA: Diagnosis not present

## 2017-12-15 NOTE — Telephone Encounter (Signed)
Returned call to patient and advised her that [name] would only be having Prolia Injection on 2/8 and no visit with Dr Jana Hakim.

## 2017-12-18 ENCOUNTER — Other Ambulatory Visit: Payer: Self-pay

## 2017-12-18 DIAGNOSIS — C50411 Malignant neoplasm of upper-outer quadrant of right female breast: Secondary | ICD-10-CM

## 2017-12-18 DIAGNOSIS — Z170 Estrogen receptor positive status [ER+]: Principal | ICD-10-CM

## 2017-12-19 ENCOUNTER — Inpatient Hospital Stay: Payer: PPO

## 2017-12-19 ENCOUNTER — Inpatient Hospital Stay: Payer: PPO | Attending: Oncology

## 2017-12-19 VITALS — BP 128/77 | HR 85 | Temp 97.7°F | Resp 20

## 2017-12-19 DIAGNOSIS — C50411 Malignant neoplasm of upper-outer quadrant of right female breast: Secondary | ICD-10-CM

## 2017-12-19 DIAGNOSIS — Z79899 Other long term (current) drug therapy: Secondary | ICD-10-CM | POA: Diagnosis not present

## 2017-12-19 DIAGNOSIS — Z170 Estrogen receptor positive status [ER+]: Principal | ICD-10-CM

## 2017-12-19 DIAGNOSIS — M8580 Other specified disorders of bone density and structure, unspecified site: Secondary | ICD-10-CM

## 2017-12-19 LAB — CBC WITH DIFFERENTIAL (CANCER CENTER ONLY)
BASOS ABS: 0 10*3/uL (ref 0.0–0.1)
BASOS PCT: 0 %
EOS PCT: 1 %
Eosinophils Absolute: 0.1 10*3/uL (ref 0.0–0.5)
HCT: 36.3 % (ref 34.8–46.6)
Hemoglobin: 12.3 g/dL (ref 11.6–15.9)
Lymphocytes Relative: 26 %
Lymphs Abs: 1.4 10*3/uL (ref 0.9–3.3)
MCH: 31.1 pg (ref 25.1–34.0)
MCHC: 33.9 g/dL (ref 31.5–36.0)
MCV: 91.7 fL (ref 79.5–101.0)
MONO ABS: 0.4 10*3/uL (ref 0.1–0.9)
Monocytes Relative: 7 %
NEUTROS ABS: 3.7 10*3/uL (ref 1.5–6.5)
Neutrophils Relative %: 66 %
PLATELETS: 271 10*3/uL (ref 145–400)
RBC: 3.96 MIL/uL (ref 3.70–5.45)
RDW: 13.1 % (ref 11.2–14.5)
WBC: 5.7 10*3/uL (ref 3.9–10.3)

## 2017-12-19 LAB — CMP (CANCER CENTER ONLY)
ALBUMIN: 3.6 g/dL (ref 3.5–5.0)
ALK PHOS: 63 U/L (ref 40–150)
ALT: 23 U/L (ref 0–55)
ANION GAP: 7 (ref 3–11)
AST: 22 U/L (ref 5–34)
BILIRUBIN TOTAL: 0.4 mg/dL (ref 0.2–1.2)
BUN: 11 mg/dL (ref 7–26)
CALCIUM: 8.9 mg/dL (ref 8.4–10.4)
CO2: 25 mmol/L (ref 22–29)
Chloride: 101 mmol/L (ref 98–109)
Creatinine: 0.83 mg/dL (ref 0.60–1.10)
GFR, Est AFR Am: >60 mL/min (ref >60)
Glucose, Bld: 78 mg/dL (ref 70–140)
Potassium: 4.3 mmol/L (ref 3.5–5.1)
Sodium: 133 mmol/L — ABNORMAL LOW (ref 136–145)
TOTAL PROTEIN: 6.5 g/dL (ref 6.4–8.3)

## 2017-12-19 MED ORDER — DENOSUMAB 60 MG/ML ~~LOC~~ SOLN
60.0000 mg | Freq: Once | SUBCUTANEOUS | Status: AC
  Administered 2017-12-19: 60 mg via SUBCUTANEOUS

## 2017-12-19 NOTE — Patient Instructions (Signed)
Denosumab injection  What is this medicine?  DENOSUMAB (den oh sue mab) slows bone breakdown. Prolia is used to treat osteoporosis in women after menopause and in men. Xgeva is used to prevent bone fractures and other bone problems caused by cancer bone metastases. Xgeva is also used to treat giant cell tumor of the bone.  This medicine may be used for other purposes; ask your health care provider or pharmacist if you have questions.  What should I tell my health care provider before I take this medicine?  They need to know if you have any of these conditions:  -dental disease  -eczema  -infection or history of infections  -kidney disease or on dialysis  -low blood calcium or vitamin D  -malabsorption syndrome  -scheduled to have surgery or tooth extraction  -taking medicine that contains denosumab  -thyroid or parathyroid disease  -an unusual reaction to denosumab, other medicines, foods, dyes, or preservatives  -pregnant or trying to get pregnant  -breast-feeding  How should I use this medicine?  This medicine is for injection under the skin. It is given by a health care professional in a hospital or clinic setting.  If you are getting Prolia, a special MedGuide will be given to you by the pharmacist with each prescription and refill. Be sure to read this information carefully each time.  For Prolia, talk to your pediatrician regarding the use of this medicine in children. Special care may be needed. For Xgeva, talk to your pediatrician regarding the use of this medicine in children. While this drug may be prescribed for children as young as 13 years for selected conditions, precautions do apply.  Overdosage: If you think you have taken too much of this medicine contact a poison control center or emergency room at once.  NOTE: This medicine is only for you. Do not share this medicine with others.  What if I miss a dose?  It is important not to miss your dose. Call your doctor or health care professional if you are  unable to keep an appointment.  What may interact with this medicine?  Do not take this medicine with any of the following medications:  -other medicines containing denosumab  This medicine may also interact with the following medications:  -medicines that suppress the immune system  -medicines that treat cancer  -steroid medicines like prednisone or cortisone  This list may not describe all possible interactions. Give your health care provider a list of all the medicines, herbs, non-prescription drugs, or dietary supplements you use. Also tell them if you smoke, drink alcohol, or use illegal drugs. Some items may interact with your medicine.  What should I watch for while using this medicine?  Visit your doctor or health care professional for regular checks on your progress. Your doctor or health care professional may order blood tests and other tests to see how you are doing.  Call your doctor or health care professional if you get a cold or other infection while receiving this medicine. Do not treat yourself. This medicine may decrease your body's ability to fight infection.  You should make sure you get enough calcium and vitamin D while you are taking this medicine, unless your doctor tells you not to. Discuss the foods you eat and the vitamins you take with your health care professional.  See your dentist regularly. Brush and floss your teeth as directed. Before you have any dental work done, tell your dentist you are receiving this medicine.  Do   not become pregnant while taking this medicine or for 5 months after stopping it. Women should inform their doctor if they wish to become pregnant or think they might be pregnant. There is a potential for serious side effects to an unborn child. Talk to your health care professional or pharmacist for more information.  What side effects may I notice from receiving this medicine?  Side effects that you should report to your doctor or health care professional as soon as  possible:  -allergic reactions like skin rash, itching or hives, swelling of the face, lips, or tongue  -breathing problems  -chest pain  -fast, irregular heartbeat  -feeling faint or lightheaded, falls  -fever, chills, or any other sign of infection  -muscle spasms, tightening, or twitches  -numbness or tingling  -skin blisters or bumps, or is dry, peels, or red  -slow healing or unexplained pain in the mouth or jaw  -unusual bleeding or bruising  Side effects that usually do not require medical attention (Report these to your doctor or health care professional if they continue or are bothersome.):  -muscle pain  -stomach upset, gas  This list may not describe all possible side effects. Call your doctor for medical advice about side effects. You may report side effects to FDA at 1-800-FDA-1088.  Where should I keep my medicine?  This medicine is only given in a clinic, doctor's office, or other health care setting and will not be stored at home.  NOTE: This sheet is a summary. It may not cover all possible information. If you have questions about this medicine, talk to your doctor, pharmacist, or health care provider.      2016, Elsevier/Gold Standard. (2012-04-27 12:37:47)

## 2017-12-26 NOTE — Progress Notes (Signed)
This encounter was created in error - please disregard.

## 2018-01-20 DIAGNOSIS — M859 Disorder of bone density and structure, unspecified: Secondary | ICD-10-CM | POA: Diagnosis not present

## 2018-01-27 DIAGNOSIS — N3020 Other chronic cystitis without hematuria: Secondary | ICD-10-CM | POA: Diagnosis not present

## 2018-01-27 DIAGNOSIS — R300 Dysuria: Secondary | ICD-10-CM | POA: Diagnosis not present

## 2018-02-12 DIAGNOSIS — I10 Essential (primary) hypertension: Secondary | ICD-10-CM | POA: Diagnosis not present

## 2018-02-12 DIAGNOSIS — R82998 Other abnormal findings in urine: Secondary | ICD-10-CM | POA: Diagnosis not present

## 2018-02-12 DIAGNOSIS — E559 Vitamin D deficiency, unspecified: Secondary | ICD-10-CM | POA: Diagnosis not present

## 2018-02-12 DIAGNOSIS — E538 Deficiency of other specified B group vitamins: Secondary | ICD-10-CM | POA: Diagnosis not present

## 2018-02-16 DIAGNOSIS — R945 Abnormal results of liver function studies: Secondary | ICD-10-CM | POA: Diagnosis not present

## 2018-02-16 DIAGNOSIS — F325 Major depressive disorder, single episode, in full remission: Secondary | ICD-10-CM | POA: Diagnosis not present

## 2018-02-16 DIAGNOSIS — Z0000 Encounter for general adult medical examination without abnormal findings: Secondary | ICD-10-CM | POA: Diagnosis not present

## 2018-02-16 DIAGNOSIS — N390 Urinary tract infection, site not specified: Secondary | ICD-10-CM | POA: Diagnosis not present

## 2018-02-16 DIAGNOSIS — J069 Acute upper respiratory infection, unspecified: Secondary | ICD-10-CM | POA: Diagnosis not present

## 2018-02-16 DIAGNOSIS — Z6830 Body mass index (BMI) 30.0-30.9, adult: Secondary | ICD-10-CM | POA: Diagnosis not present

## 2018-02-16 DIAGNOSIS — C50919 Malignant neoplasm of unspecified site of unspecified female breast: Secondary | ICD-10-CM | POA: Diagnosis not present

## 2018-02-16 DIAGNOSIS — R197 Diarrhea, unspecified: Secondary | ICD-10-CM | POA: Diagnosis not present

## 2018-02-16 DIAGNOSIS — R0609 Other forms of dyspnea: Secondary | ICD-10-CM | POA: Diagnosis not present

## 2018-02-16 DIAGNOSIS — Z1389 Encounter for screening for other disorder: Secondary | ICD-10-CM | POA: Diagnosis not present

## 2018-02-16 DIAGNOSIS — M792 Neuralgia and neuritis, unspecified: Secondary | ICD-10-CM | POA: Diagnosis not present

## 2018-02-16 DIAGNOSIS — J45901 Unspecified asthma with (acute) exacerbation: Secondary | ICD-10-CM | POA: Diagnosis not present

## 2018-02-23 DIAGNOSIS — Z1212 Encounter for screening for malignant neoplasm of rectum: Secondary | ICD-10-CM | POA: Diagnosis not present

## 2018-03-01 DIAGNOSIS — R300 Dysuria: Secondary | ICD-10-CM | POA: Diagnosis not present

## 2018-03-06 DIAGNOSIS — N3020 Other chronic cystitis without hematuria: Secondary | ICD-10-CM | POA: Diagnosis not present

## 2018-03-06 DIAGNOSIS — N3941 Urge incontinence: Secondary | ICD-10-CM | POA: Diagnosis not present

## 2018-03-10 DIAGNOSIS — N3946 Mixed incontinence: Secondary | ICD-10-CM | POA: Diagnosis not present

## 2018-03-10 DIAGNOSIS — M62838 Other muscle spasm: Secondary | ICD-10-CM | POA: Diagnosis not present

## 2018-03-10 DIAGNOSIS — M6281 Muscle weakness (generalized): Secondary | ICD-10-CM | POA: Diagnosis not present

## 2018-03-10 DIAGNOSIS — R102 Pelvic and perineal pain: Secondary | ICD-10-CM | POA: Diagnosis not present

## 2018-03-10 DIAGNOSIS — M6289 Other specified disorders of muscle: Secondary | ICD-10-CM | POA: Diagnosis not present

## 2018-03-19 DIAGNOSIS — M62838 Other muscle spasm: Secondary | ICD-10-CM | POA: Diagnosis not present

## 2018-03-19 DIAGNOSIS — N3946 Mixed incontinence: Secondary | ICD-10-CM | POA: Diagnosis not present

## 2018-03-19 DIAGNOSIS — R102 Pelvic and perineal pain: Secondary | ICD-10-CM | POA: Diagnosis not present

## 2018-03-19 DIAGNOSIS — M6281 Muscle weakness (generalized): Secondary | ICD-10-CM | POA: Diagnosis not present

## 2018-03-21 ENCOUNTER — Other Ambulatory Visit: Payer: Self-pay | Admitting: Oncology

## 2018-03-23 ENCOUNTER — Other Ambulatory Visit: Payer: Self-pay

## 2018-03-23 DIAGNOSIS — R102 Pelvic and perineal pain: Secondary | ICD-10-CM | POA: Diagnosis not present

## 2018-03-23 DIAGNOSIS — M62838 Other muscle spasm: Secondary | ICD-10-CM | POA: Diagnosis not present

## 2018-03-23 DIAGNOSIS — C50411 Malignant neoplasm of upper-outer quadrant of right female breast: Secondary | ICD-10-CM

## 2018-03-23 DIAGNOSIS — Z170 Estrogen receptor positive status [ER+]: Principal | ICD-10-CM

## 2018-03-23 DIAGNOSIS — N3946 Mixed incontinence: Secondary | ICD-10-CM | POA: Diagnosis not present

## 2018-03-23 DIAGNOSIS — M6281 Muscle weakness (generalized): Secondary | ICD-10-CM | POA: Diagnosis not present

## 2018-03-23 DIAGNOSIS — M6289 Other specified disorders of muscle: Secondary | ICD-10-CM | POA: Diagnosis not present

## 2018-03-23 MED ORDER — LETROZOLE 2.5 MG PO TABS: 2.5000 mg | ORAL_TABLET | Freq: Every day | ORAL | 2 refills | Status: DC

## 2018-03-31 DIAGNOSIS — N3946 Mixed incontinence: Secondary | ICD-10-CM | POA: Diagnosis not present

## 2018-03-31 DIAGNOSIS — M62838 Other muscle spasm: Secondary | ICD-10-CM | POA: Diagnosis not present

## 2018-03-31 DIAGNOSIS — M6289 Other specified disorders of muscle: Secondary | ICD-10-CM | POA: Diagnosis not present

## 2018-03-31 DIAGNOSIS — M6281 Muscle weakness (generalized): Secondary | ICD-10-CM | POA: Diagnosis not present

## 2018-03-31 DIAGNOSIS — R102 Pelvic and perineal pain: Secondary | ICD-10-CM | POA: Diagnosis not present

## 2018-04-22 DIAGNOSIS — N3946 Mixed incontinence: Secondary | ICD-10-CM | POA: Diagnosis not present

## 2018-04-22 DIAGNOSIS — R102 Pelvic and perineal pain: Secondary | ICD-10-CM | POA: Diagnosis not present

## 2018-04-22 DIAGNOSIS — M6281 Muscle weakness (generalized): Secondary | ICD-10-CM | POA: Diagnosis not present

## 2018-04-22 DIAGNOSIS — N3941 Urge incontinence: Secondary | ICD-10-CM | POA: Diagnosis not present

## 2018-04-22 DIAGNOSIS — M62838 Other muscle spasm: Secondary | ICD-10-CM | POA: Diagnosis not present

## 2018-05-06 ENCOUNTER — Telehealth: Payer: Self-pay | Admitting: *Deleted

## 2018-05-06 MED ORDER — LETROZOLE 2.5 MG PO TABS: 2.5000 mg | ORAL_TABLET | Freq: Every day | ORAL | 0 refills | Status: DC

## 2018-05-19 NOTE — Telephone Encounter (Signed)
No entry 

## 2018-06-03 ENCOUNTER — Ambulatory Visit: Payer: PPO | Admitting: Obstetrics & Gynecology

## 2018-06-03 ENCOUNTER — Encounter: Payer: Self-pay | Admitting: Obstetrics & Gynecology

## 2018-06-03 VITALS — BP 128/80 | Ht 60.75 in | Wt 166.0 lb

## 2018-06-03 DIAGNOSIS — Z780 Asymptomatic menopausal state: Secondary | ICD-10-CM

## 2018-06-03 DIAGNOSIS — Z9189 Other specified personal risk factors, not elsewhere classified: Secondary | ICD-10-CM

## 2018-06-03 DIAGNOSIS — Z01419 Encounter for gynecological examination (general) (routine) without abnormal findings: Secondary | ICD-10-CM | POA: Diagnosis not present

## 2018-06-03 DIAGNOSIS — M8589 Other specified disorders of bone density and structure, multiple sites: Secondary | ICD-10-CM

## 2018-06-03 DIAGNOSIS — Z90710 Acquired absence of both cervix and uterus: Secondary | ICD-10-CM

## 2018-06-03 DIAGNOSIS — Z853 Personal history of malignant neoplasm of breast: Secondary | ICD-10-CM

## 2018-06-03 DIAGNOSIS — C50411 Malignant neoplasm of upper-outer quadrant of right female breast: Secondary | ICD-10-CM

## 2018-06-03 DIAGNOSIS — Z170 Estrogen receptor positive status [ER+]: Secondary | ICD-10-CM

## 2018-06-03 NOTE — Progress Notes (Signed)
[name] July 04, 1941 [id number]   History:    77 y.o. G2P2L2 Married.    RP:  Established patient presenting for annual gyn exam   HPI:  S/P Hysterectomy.  Menopause, well on no HRT.  No pelvic pain.  Urine/BMs wnl.  Breast Ca followed by Dr Jana Hakim: Right lumpectomy and sentinel lymph node sampling 09/04/2010. The pathology from this procedure showed an invasive lobular carcinoma, grade 2, measuring 3 cm. Margins were initially close, but additional tissue was obtained from the inferior and deep margin, showing no carcinoma. One out of 3 sentinel lymph nodes was involved by a 7 mm metastatic deposit.  Received chemotherapy, radiation therapy and tamoxifen.  Now on Letrozole since June 2012.  Osteopenia on recent BD with Dr [name].  On Prolia.  Urine and bowel movements normal.  Body mass index 31.62.  Health labs with family physician.  Past medical history,surgical history, family history and social history were all reviewed and documented in the EPIC chart.  Gynecologic History No LMP recorded. Patient has had a hysterectomy. Contraception: status post hysterectomy Last Pap: 02/2017. Results were: Negative Last mammogram: 12/2017. Results were: Benign Bone Density: 2019 Osteopenia stable with Dr [name].  On Prolia. Colonoscopy: 2017  Obstetric History OB History  Gravida Para Term Preterm AB Living  2 2       2   SAB TAB Ectopic Multiple Live Births               # Outcome Date GA Lbr Len/2nd Weight Sex Delivery Anes PTL Lv  2 Para           1 Para              ROS: A ROS was performed and pertinent positives and negatives are included in the history.  GENERAL: No fevers or chills. HEENT: No change in vision, no earache, sore throat or sinus congestion. NECK: No pain or stiffness. CARDIOVASCULAR: No chest pain or pressure. No palpitations. PULMONARY: No shortness of breath, cough or wheeze. GASTROINTESTINAL: No abdominal pain, nausea, vomiting or diarrhea, melena or bright  red blood per rectum. GENITOURINARY: No urinary frequency, urgency, hesitancy or dysuria. MUSCULOSKELETAL: No joint or muscle pain, no back pain, no recent trauma. DERMATOLOGIC: No rash, no itching, no lesions. ENDOCRINE: No polyuria, polydipsia, no heat or cold intolerance. No recent change in weight. HEMATOLOGICAL: No anemia or easy bruising or bleeding. NEUROLOGIC: No headache, seizures, numbness, tingling or weakness. PSYCHIATRIC: No depression, no loss of interest in normal activity or change in sleep pattern.     Exam:   BP 128/80   Ht 5' 0.75" (1.543 m)   Wt 166 lb (75.3 kg)   BMI 31.62 kg/m   Body mass index is 31.62 kg/m.  General appearance : Well developed well nourished female. No acute distress HEENT: Eyes: no retinal hemorrhage or exudates,  Neck supple, trachea midline, no carotid bruits, no thyroidmegaly Lungs: Clear to auscultation, no rhonchi or wheezes, or rib retractions  Heart: Regular rate and rhythm, no murmurs or gallops Breast:Examined in sitting and supine position were symmetrical in appearance, no palpable masses or tenderness,  no skin retraction, no nipple inversion, no nipple discharge, no skin discoloration, no axillary or supraclavicular lymphadenopathy Abdomen: no palpable masses or tenderness, no rebound or guarding Extremities: no edema or skin discoloration or tenderness  Pelvic: Vulva: Normal             Vagina: No gross lesions or discharge  Cervix/Uterus absent  Adnexa  Without masses or tenderness  Anus: Normal   Assessment/Plan:  77 y.o. female for annual exam   1. Well female exam with routine gynecological exam Normal gynecologic exam status post hysterectomy and menopause.  No indication for Pap test at this time.  Breast exam normal.  Last screening mammogram was benign in February 2019.  Colonoscopy in 2017.  Health labs with family physician.  2. S/P total hysterectomy  3. Menopause present Well on no hormone replacement  therapy.  4. Malignant neoplasm of upper-outer quadrant of right breast in female, estrogen receptor positive ([place]) Followed by Dr. Jana Hakim.  On letrozole.  5. Osteopenia of multiple sites Continue with Prolia.  Vitamin D supplements, calcium rich nutrition and regular weightbearing physical activity recommended.  Princess Bruins MD, 3:43 PM 06/03/2018

## 2018-06-07 ENCOUNTER — Encounter: Payer: Self-pay | Admitting: Obstetrics & Gynecology

## 2018-06-07 NOTE — Patient Instructions (Addendum)
1. Well female exam with routine gynecological exam Normal gynecologic exam status post hysterectomy and menopause.  No indication for Pap test at this time.  Breast exam normal.  Last screening mammogram was benign in February 2019.  Colonoscopy in 2017.  Health labs with family physician.  2. S/P total hysterectomy  3. Menopause present Well on no hormone replacement therapy.  4. Malignant neoplasm of upper-outer quadrant of right breast in female, estrogen receptor positive ([place]) Followed by Dr. Jana Hakim.  On letrozole.  5. Osteopenia of multiple sites Continue with Prolia.  Vitamin D supplements, calcium rich nutrition and regular weightbearing physical activity recommended.  [name], it was a pleasure seeing you today!

## 2018-06-25 ENCOUNTER — Ambulatory Visit: Payer: PPO

## 2018-06-25 ENCOUNTER — Other Ambulatory Visit: Payer: PPO

## 2018-06-25 ENCOUNTER — Ambulatory Visit: Payer: PPO | Admitting: Oncology

## 2018-06-29 ENCOUNTER — Other Ambulatory Visit: Payer: Self-pay | Admitting: Oncology

## 2018-06-29 DIAGNOSIS — M8580 Other specified disorders of bone density and structure, unspecified site: Secondary | ICD-10-CM

## 2018-06-29 NOTE — Progress Notes (Signed)
[place]  [phone] [phone]     ID: [name] DOB: 09/16/1941  MR#: [id number]  NOB#:[id number]  Patient Care Team: Debbie Infante, MD as PCP - General (Internal Medicine) Debbie Planas, MD as Consulting Physician (Orthopedic Surgery) Sparks, Amy, MD as Consulting Physician (Dermatology) Pyrtle, Lajuan Lines, MD as Consulting Physician (Gastroenterology) Debbie Koh MD; Debbie Hila MD, Sparks Martinique MD, [name] M.D., [name], DDS, Debbie Gamma MD  CHIEF COMPLAINT: Stage II breast cancer followup  CURRENT TREATMENT: Completing 7 years of letrozole, denosumab  BREAST CANCER HISTORY: From the original intake note:  "[name]" had routine mammography at [facility] showing an area of abnormality in the right upper outer quadrant and an abnormal-looking right axillary lymph node. Both of these areas were biopsied 07/30/2010, with the pathology ([id number]) showing, in the breast, an invasive cancer with lobular features, estrogen receptor 97% positive, progesterone receptor negative, with an MIB-1 of 29% and no HER-2 amplification, the signals ratio being 1.00. Biopsy of the abnormal appearing lymph node was benign, but possibly discordant.  The patient then saw [name] at [facility] and after appropriate discussion underwent right lumpectomy and sentinel lymph node sampling 09/04/2010. The pathology from this procedure ([id number]) showed an invasive lobular carcinoma, grade 2, measuring 3 cm. Margins were initially close, but additional tissue was obtained from the inferior and deep margin, showing no carcinoma. One out of 3 sentinel lymph nodes was involved by a 7 mm metastatic deposit.  An Oncotype was obtained from this procedure, and showed a recurrence score of 22, predicting a rate of distant recurrence of 14% if the patient's only systemic treatment was tamoxifen for 5 years.  [name] then saw [name] and he started her on "TAC"  chemotherapy, namely cyclophosphamide, doxorubicin and docetaxel. This was initiated 10/15/2010 and was very poorly tolerated. There was a 25% dose reduction beginning with a second cycle and doxorubicin was omitted from the sixth cycle. Despite this the patient had 2 admissions for febrile neutropenia and other complications. She switched her care to [facility] and returned to [facility] to receive radiation therapy under [name]. This was concluded June 2012, and [name] was started on letrozole 04/24/2011.  Her subsequent history is as detailed below.  INTERVAL HISTORY: [name] returns today for follow-up and treatment of her estrogen receptor positive breast cancer. She continues on letrozole, with god tolerance. She denies issues with hot flashes or vaginal dryness.  She also receive denosumab/Prolia, every 6 months with a dose due today. She also tolerates this well without any complications.    She completed a bone density at [name] on 01/20/2018 which showed a T-score of -1.1  REVIEW OF SYSTEMS: [name] reports that for exercise, she takes aerobics classes 3 times per week and water aerobics in the summer. She is planning on selling her house and moving to [place]. She denies unusual headaches, visual changes, nausea, vomiting, or dizziness. There has been no unusual cough, phlegm production, or pleurisy. There has been no change in bowel or bladder habits. She denies unexplained fatigue or unexplained weight loss, bleeding, rash, or fever. A detailed review of systems was otherwise stable.    PAST MEDICAL HISTORY: Past Medical History:  Diagnosis Date  . Allergic rhinitis   . Breast cancer ([place]) 2011   right side  . Diverticulitis   . Diverticulosis   . Endometriosis   . Hypertension   . Tubular adenoma of colon     PAST SURGICAL  HISTORY: Past Surgical History:  Procedure Laterality Date  . APPENDECTOMY  1988  . BACK SURGERY  09/1999  . BREAST LUMPECTOMY Right  09/04/2010   w/ Sentinel Node Biopsy  . BREAST LUMPECTOMY  8/81962   Fibroadenoma. Left  . carpel tunnel Right last five years (04/19/15)  . CATARACT EXTRACTION W/ INTRAOCULAR LENS  IMPLANT, BILATERAL Bilateral 05/15/16 and 05/09/16  . COLONOSCOPY    . LIPOMA EXCISION  08/2002   L shoulder   . NASAL SINUS SURGERY  1994  . OCCIPITAL NEURECTOMY     Nerve block  . pvd Right   . rectal fissure repair  11/1955  . TOTAL ABDOMINAL HYSTERECTOMY  1988  . TRIGGER FINGER RELEASE     x3    FAMILY HISTORY Family History  Problem Relation Age of Onset  . Heart disease Father   . Lung cancer Father        smoker  . Heart attack Father   . Heart disease Mother   . Melanoma Daughter   The patient's father died at the age of 87 from lung cancer in the setting of tobacco abuse. The patient's mother died from congestive heart failure at the age of 93. She had had a valve problem from childhood. The patient's older daughter had a diagnosis of melanoma at age 38 there is a history of breast cancer on both sides of the family and a history of ovarian cancer in the patient's maternal grandmother. The patient herself however was tested at [facility] and does not carry a BRCA mutation.   GYNECOLOGIC HISTORY:  No LMP recorded. Patient has had a hysterectomy. Menarche age 2, first live birth age 74, the patient is [place] P2. She had a hysterectomy with bilateral salpingo-oophorectomy in 1987. She took hormone replacement for more than 20 years, finally stopping in 2009. 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 Her husband [name] is a retired Armed forces operational officer.  [place] are considering moving to [place].  The patient's older daughter, [name] had a history of depression and committed suicide at age 17, while working towards a Oceanographer in social work. The patient's younger  daughter, [name], is a Conservation officer, historic buildings with [employer]. The patient has 2 grandchildren, both in [place]. She attends [employer] .    ADVANCED DIRECTIVES: In place   HEALTH MAINTENANCE: Social History   Tobacco Use  . Smoking status: Former Smoker    Packs/day: 1.00    Years: 10.00    Pack years: 10.00    Types: Cigarettes    Last attempt to quit: 11/11/1978    Years since quitting: 39.6  . Smokeless tobacco: Never Used  Substance Use Topics  . Alcohol use: No    Alcohol/week: 2.0 standard drinks    Types: 2 Glasses of wine per week  . Drug use: No     Colonoscopy: 2008/ [facility]  PAP: Status post hysterectomy  Bone density: T score of -0.6 (2009), -1.0 (2010), -1.2 (2013) at the spine, T score 0.3  (2013) at the left femoral neck  Lipid panel:  No Known Allergies  Current Outpatient Medications  Medication Sig Dispense Refill  . buPROPion (WELLBUTRIN XL) 150 MG 24 hr tablet Take 150 mg by mouth daily.    Marland Kitchen CALCIUM & MAGNESIUM CARBONATES PO Take 1 tablet by mouth daily.    . [employer] SALINE NOSE SPRAY NA Place  1 spray into the nose 2 (two) times daily as needed. Nasal congestion.    . CYANOCOBALAMIN IJ Inject 1 vial as directed See admin instructions. Every three months. Patient had shot on 5/8.    Marland Kitchen CYANOCOBALAMIN PO Take 1 tablet by mouth daily.    Marland Kitchen denosumab (PROLIA) 60 MG/ML SOLN injection Inject 60 mg into the skin every 6 (six) months. Administer in upper arm, thigh, or abdomen    . fluticasone (FLONASE) 50 MCG/ACT nasal spray Place 2 sprays into the nose as needed.      Marland Kitchen letrozole (FEMARA) 2.5 MG tablet Take 1 tablet (2.5 mg total) by mouth daily. 90 tablet 2  . nebivolol (BYSTOLIC) 5 MG tablet Take 5 mg by mouth daily.      . ondansetron (ZOFRAN-ODT) 8 MG disintegrating tablet Take 1 tablet (8 mg total) by mouth every 8 (eight) hours as needed for nausea or vomiting. 20 tablet 1  . polyethylene glycol (MIRALAX / GLYCOLAX) packet Take 17 g by mouth  daily.    . valACYclovir (VALTREX) 500 MG tablet Take 500 mg by mouth 2 (two) times daily.     No current facility-administered medications for this visit.     OBJECTIVE: Middle-aged white woman in no acute distress  Vitals:   06/30/18 1141  BP: 103/71  Pulse: 73  Resp: 18  Temp: 97.7 F (36.5 C)  SpO2: 99%     Body mass index is 29.51 kg/m.    ECOG FS:0 - Asymptomatic  Sclerae unicteric, EOMs intact Oropharynx clear and moist No cervical or supraclavicular adenopathy Lungs no rales or rhonchi Heart regular rate and rhythm Abd soft, nontender, positive bowel sounds MSK no focal spinal tenderness, no upper extremity lymphedema Neuro: nonfocal, well oriented, appropriate affect Breasts: The right breast is status post lumpectomy and radiation.  There is no evidence of local recurrence.  The left breast is benign.  Both axillae are benign.  LAB RESULTS:  CMP     Component Value Date/Time   NA 133 (L) 12/19/2017 1235   NA 133 (L) 06/18/2017 1257   K 4.3 12/19/2017 1235   K 3.9 06/18/2017 1257   CL 101 12/19/2017 1235   CO2 25 12/19/2017 1235   CO2 25 06/18/2017 1257   GLUCOSE 78 12/19/2017 1235   GLUCOSE 83 06/18/2017 1257   BUN 11 12/19/2017 1235   BUN 13.4 06/18/2017 1257   CREATININE 0.83 12/19/2017 1235   CREATININE 0.8 06/18/2017 1257   CALCIUM 8.9 12/19/2017 1235   CALCIUM 9.5 06/18/2017 1257   PROT 6.5 12/19/2017 1235   PROT 6.7 06/18/2017 1257   ALBUMIN 3.6 12/19/2017 1235   ALBUMIN 3.6 06/18/2017 1257   AST 22 12/19/2017 1235   AST 26 06/18/2017 1257   ALT 23 12/19/2017 1235   ALT 23 06/18/2017 1257   ALKPHOS 63 12/19/2017 1235   ALKPHOS 66 06/18/2017 1257   BILITOT 0.4 12/19/2017 1235   BILITOT 0.47 06/18/2017 1257   GFRNONAA >60 12/19/2017 1235   GFRAA >60 12/19/2017 1235    I No results found for: SPEP  Lab Results  Component Value Date   WBC 5.4 06/30/2018   NEUTROABS 3.6 06/30/2018   HGB 12.9 06/30/2018   HCT 38.0 06/30/2018   MCV  90.8 06/30/2018   PLT 318 06/30/2018      Chemistry      Component Value Date/Time   NA 133 (L) 12/19/2017 1235   NA 133 (L) 06/18/2017 1257   K 4.3 12/19/2017  1235   K 3.9 06/18/2017 1257   CL 101 12/19/2017 1235   CO2 25 12/19/2017 1235   CO2 25 06/18/2017 1257   BUN 11 12/19/2017 1235   BUN 13.4 06/18/2017 1257   CREATININE 0.83 12/19/2017 1235   CREATININE 0.8 06/18/2017 1257      Component Value Date/Time   CALCIUM 8.9 12/19/2017 1235   CALCIUM 9.5 06/18/2017 1257   ALKPHOS 63 12/19/2017 1235   ALKPHOS 66 06/18/2017 1257   AST 22 12/19/2017 1235   AST 26 06/18/2017 1257   ALT 23 12/19/2017 1235   ALT 23 06/18/2017 1257   BILITOT 0.4 12/19/2017 1235   BILITOT 0.47 06/18/2017 1257       No results found for: LABCA2  No components found for: LABCA125  No results for input(s): INR in the last 168 hours.  Urinalysis    Component Value Date/Time   COLORURINE YELLOW 04/17/2016 1222   APPEARANCEUR CLEAR 04/17/2016 1222   LABSPEC <=1.005 (A) 04/17/2016 1222   LABSPEC 1.025 01/17/2011 1523   PHURINE 6.5 04/17/2016 1222   GLUCOSEU NEGATIVE 04/17/2016 1222   HGBUR NEGATIVE 04/17/2016 1222   BILIRUBINUR NEGATIVE 04/17/2016 1222   BILIRUBINUR Negative 01/17/2011 1523   KETONESUR NEGATIVE 04/17/2016 1222   PROTEINUR 30 (A) 04/01/2016 0850   UROBILINOGEN 0.2 04/17/2016 1222   NITRITE NEGATIVE 04/17/2016 1222   LEUKOCYTESUR NEGATIVE 04/17/2016 1222   LEUKOCYTESUR Moderate 01/17/2011 1523    STUDIES: Bone density from March 2019 discussed with the patient   ASSESSMENT: 77 y.o. BRCA negative [place] woman s/p right upper outer quadrant lumpectomy and sentinel lymph node sampling 09/04/2010 for a pT2 pN1a, stage IIB invasive lobular carcinoma, grade 2, estrogen receptor 97% positive, progesterone receptor and HER-2 negative  (1) Oncotype score of 22 predicted a risk of outside the breast recurrence within 10 years of 14% if the patient's only adjuvant therapy was  tamoxifen for 5 years.  (2) received adjuvant cyclophosphamide, doxorubicin and docetaxel x6 between December of 2011 and April of 2012, with a 25% dose reduction beginning with cycle 2 and doxorubicin held cycle 6  (3) completed adjuvant radiation to the right breast and axilla June 2012  (4) on letrozole as of 04/24/2011, plan is to continue it for 10 years  (a) DEXA scan 10/05/2014 showed osteopenia with a T score of -1.5  (b) repeat DEXA scan at [facility] 12/11/2015 showed a T score of -1.3.  (c) denosumab/Prolia started 10/17/2015, final dose 06/30/2018  PLAN: [name] is now just about 8 years out from definitive surgery for her breast cancer with no evidence of disease recurrence.  This is very favorable.  She is completing 7 years of letrozole.  She understands the current data tells [place] that since 10 years of this drug it is no better than 7 and therefore I am comfortable stopping at this point.  She wanted to know her risk of her cancer coming back.  We reviewed her Oncotype results results and she understands that by taking anastrozole for 7 years instead of tamoxifen for 5 she has further lower her risk by 3 to 4%.  That would bring [place] to a 10% risk over 10 years, but 7 years have already passed.  Accordingly her risk of recurrence at this point is well under 5%, probably in the 3% range  She will receive her last dose of Prolia today.  Her bone density from March of this year was basically normal.  At this point I feel  comfortable releasing her to her primary care physician.  All she will need in terms of breast cancer follow-up is her yearly mammography and a yearly physician breast exam  I will be glad to see [name] again at any point in the future if and when the need arises but as of now are making no further routine appointments for her here.  Annalina Needles, Virgie Dad, MD  06/30/18 12:04 PM Medical Oncology and Hematology [facility] [place], [place] [address] Tel. [phone]    Fax. [phone]  Alice Rieger, am acting as scribe for Chauncey Cruel MD.  I, Lurline Del MD, have reviewed the above documentation for accuracy and completeness, and I agree with the above.

## 2018-06-30 ENCOUNTER — Other Ambulatory Visit: Payer: Self-pay | Admitting: *Deleted

## 2018-06-30 ENCOUNTER — Inpatient Hospital Stay: Payer: PPO

## 2018-06-30 ENCOUNTER — Inpatient Hospital Stay: Payer: PPO | Admitting: Oncology

## 2018-06-30 ENCOUNTER — Inpatient Hospital Stay: Payer: PPO | Attending: Oncology

## 2018-06-30 VITALS — BP 103/71 | HR 73 | Temp 97.7°F | Resp 18 | Ht 60.75 in | Wt 154.9 lb

## 2018-06-30 DIAGNOSIS — Z79899 Other long term (current) drug therapy: Secondary | ICD-10-CM | POA: Insufficient documentation

## 2018-06-30 DIAGNOSIS — Z170 Estrogen receptor positive status [ER+]: Secondary | ICD-10-CM

## 2018-06-30 DIAGNOSIS — C779 Secondary and unspecified malignant neoplasm of lymph node, unspecified: Secondary | ICD-10-CM

## 2018-06-30 DIAGNOSIS — Z801 Family history of malignant neoplasm of trachea, bronchus and lung: Secondary | ICD-10-CM | POA: Insufficient documentation

## 2018-06-30 DIAGNOSIS — Z90722 Acquired absence of ovaries, bilateral: Secondary | ICD-10-CM | POA: Insufficient documentation

## 2018-06-30 DIAGNOSIS — Z79811 Long term (current) use of aromatase inhibitors: Secondary | ICD-10-CM | POA: Diagnosis not present

## 2018-06-30 DIAGNOSIS — Z90710 Acquired absence of both cervix and uterus: Secondary | ICD-10-CM

## 2018-06-30 DIAGNOSIS — M8580 Other specified disorders of bone density and structure, unspecified site: Secondary | ICD-10-CM

## 2018-06-30 DIAGNOSIS — C50411 Malignant neoplasm of upper-outer quadrant of right female breast: Secondary | ICD-10-CM

## 2018-06-30 DIAGNOSIS — I10 Essential (primary) hypertension: Secondary | ICD-10-CM | POA: Diagnosis not present

## 2018-06-30 DIAGNOSIS — Z87891 Personal history of nicotine dependence: Secondary | ICD-10-CM | POA: Diagnosis not present

## 2018-06-30 DIAGNOSIS — Z9221 Personal history of antineoplastic chemotherapy: Secondary | ICD-10-CM

## 2018-06-30 DIAGNOSIS — Z86010 Personal history of colonic polyps: Secondary | ICD-10-CM

## 2018-06-30 DIAGNOSIS — Z8719 Personal history of other diseases of the digestive system: Secondary | ICD-10-CM | POA: Insufficient documentation

## 2018-06-30 DIAGNOSIS — Z803 Family history of malignant neoplasm of breast: Secondary | ICD-10-CM | POA: Diagnosis not present

## 2018-06-30 LAB — CBC WITH DIFFERENTIAL (CANCER CENTER ONLY)
Basophils Absolute: 0 10*3/uL (ref 0.0–0.1)
Basophils Relative: 0 %
EOS PCT: 2 %
Eosinophils Absolute: 0.1 10*3/uL (ref 0.0–0.5)
HEMATOCRIT: 38 % (ref 34.8–46.6)
HEMOGLOBIN: 12.9 g/dL (ref 11.6–15.9)
LYMPHS ABS: 1.3 10*3/uL (ref 0.9–3.3)
LYMPHS PCT: 24 %
MCH: 30.8 pg (ref 25.1–34.0)
MCHC: 33.9 g/dL (ref 31.5–36.0)
MCV: 90.8 fL (ref 79.5–101.0)
Monocytes Absolute: 0.4 10*3/uL (ref 0.1–0.9)
Monocytes Relative: 7 %
NEUTROS PCT: 67 %
Neutro Abs: 3.6 10*3/uL (ref 1.5–6.5)
Platelet Count: 318 10*3/uL (ref 145–400)
RBC: 4.18 MIL/uL (ref 3.70–5.45)
RDW: 13.5 % (ref 11.2–14.5)
WBC: 5.4 10*3/uL (ref 3.9–10.3)

## 2018-06-30 LAB — CMP (CANCER CENTER ONLY)
ALK PHOS: 75 U/L (ref 38–126)
ALT: 31 U/L (ref 0–44)
AST: 26 U/L (ref 15–41)
Albumin: 3.9 g/dL (ref 3.5–5.0)
Anion gap: 6 (ref 5–15)
BUN: 18 mg/dL (ref 8–23)
CALCIUM: 9.1 mg/dL (ref 8.9–10.3)
CO2: 26 mmol/L (ref 22–32)
CREATININE: 0.84 mg/dL (ref 0.44–1.00)
Chloride: 104 mmol/L (ref 98–111)
Glucose, Bld: 86 mg/dL (ref 70–99)
Potassium: 4.4 mmol/L (ref 3.5–5.1)
Sodium: 136 mmol/L (ref 135–145)
Total Bilirubin: 0.4 mg/dL (ref 0.3–1.2)
Total Protein: 7.3 g/dL (ref 6.5–8.1)

## 2018-06-30 MED ORDER — DENOSUMAB 60 MG/ML ~~LOC~~ SOLN
60.0000 mg | Freq: Once | SUBCUTANEOUS | Status: AC
  Administered 2018-06-30: 60 mg via SUBCUTANEOUS

## 2018-07-01 ENCOUNTER — Telehealth: Payer: Self-pay | Admitting: Oncology

## 2018-07-01 NOTE — Telephone Encounter (Signed)
Per 8/20 no los

## 2018-07-30 DIAGNOSIS — E538 Deficiency of other specified B group vitamins: Secondary | ICD-10-CM | POA: Diagnosis not present

## 2018-09-21 DIAGNOSIS — L821 Other seborrheic keratosis: Secondary | ICD-10-CM | POA: Diagnosis not present

## 2018-09-21 DIAGNOSIS — D225 Melanocytic nevi of trunk: Secondary | ICD-10-CM | POA: Diagnosis not present

## 2018-10-02 DIAGNOSIS — N3020 Other chronic cystitis without hematuria: Secondary | ICD-10-CM | POA: Diagnosis not present

## 2018-10-22 DIAGNOSIS — H26493 Other secondary cataract, bilateral: Secondary | ICD-10-CM | POA: Diagnosis not present

## 2018-10-22 DIAGNOSIS — Z961 Presence of intraocular lens: Secondary | ICD-10-CM | POA: Diagnosis not present

## 2018-10-22 DIAGNOSIS — H52203 Unspecified astigmatism, bilateral: Secondary | ICD-10-CM | POA: Diagnosis not present

## 2018-10-22 DIAGNOSIS — H524 Presbyopia: Secondary | ICD-10-CM | POA: Diagnosis not present

## 2018-10-22 DIAGNOSIS — H5213 Myopia, bilateral: Secondary | ICD-10-CM | POA: Diagnosis not present

## 2018-10-28 DIAGNOSIS — H26492 Other secondary cataract, left eye: Secondary | ICD-10-CM | POA: Diagnosis not present

## 2018-12-02 DIAGNOSIS — H26491 Other secondary cataract, right eye: Secondary | ICD-10-CM | POA: Diagnosis not present

## 2019-01-22 DIAGNOSIS — R05 Cough: Secondary | ICD-10-CM | POA: Diagnosis not present

## 2019-01-22 DIAGNOSIS — J209 Acute bronchitis, unspecified: Secondary | ICD-10-CM | POA: Diagnosis not present

## 2019-02-22 DIAGNOSIS — E538 Deficiency of other specified B group vitamins: Secondary | ICD-10-CM | POA: Diagnosis not present

## 2019-02-22 DIAGNOSIS — M109 Gout, unspecified: Secondary | ICD-10-CM | POA: Diagnosis not present

## 2019-02-22 DIAGNOSIS — E2839 Other primary ovarian failure: Secondary | ICD-10-CM | POA: Diagnosis not present

## 2019-02-22 DIAGNOSIS — E785 Hyperlipidemia, unspecified: Secondary | ICD-10-CM | POA: Diagnosis not present

## 2019-02-22 DIAGNOSIS — L659 Nonscarring hair loss, unspecified: Secondary | ICD-10-CM | POA: Diagnosis not present

## 2019-02-22 DIAGNOSIS — F419 Anxiety disorder, unspecified: Secondary | ICD-10-CM | POA: Diagnosis not present

## 2019-02-22 DIAGNOSIS — I10 Essential (primary) hypertension: Secondary | ICD-10-CM | POA: Diagnosis not present

## 2019-02-22 DIAGNOSIS — R82998 Other abnormal findings in urine: Secondary | ICD-10-CM | POA: Diagnosis not present

## 2019-02-22 DIAGNOSIS — Z862 Personal history of diseases of the blood and blood-forming organs and certain disorders involving the immune mechanism: Secondary | ICD-10-CM | POA: Diagnosis not present

## 2019-02-22 DIAGNOSIS — Z0000 Encounter for general adult medical examination without abnormal findings: Secondary | ICD-10-CM | POA: Diagnosis not present

## 2019-02-22 DIAGNOSIS — E559 Vitamin D deficiency, unspecified: Secondary | ICD-10-CM | POA: Diagnosis not present

## 2019-02-22 DIAGNOSIS — I2510 Atherosclerotic heart disease of native coronary artery without angina pectoris: Secondary | ICD-10-CM | POA: Diagnosis not present

## 2019-02-22 DIAGNOSIS — I5181 Takotsubo syndrome: Secondary | ICD-10-CM | POA: Diagnosis not present

## 2019-02-22 DIAGNOSIS — K219 Gastro-esophageal reflux disease without esophagitis: Secondary | ICD-10-CM | POA: Diagnosis not present

## 2019-02-25 DIAGNOSIS — I10 Essential (primary) hypertension: Secondary | ICD-10-CM | POA: Diagnosis not present

## 2019-02-25 DIAGNOSIS — M8580 Other specified disorders of bone density and structure, unspecified site: Secondary | ICD-10-CM | POA: Diagnosis not present

## 2019-02-25 DIAGNOSIS — R945 Abnormal results of liver function studies: Secondary | ICD-10-CM | POA: Diagnosis not present

## 2019-02-25 DIAGNOSIS — N390 Urinary tract infection, site not specified: Secondary | ICD-10-CM | POA: Diagnosis not present

## 2019-02-25 DIAGNOSIS — Z1331 Encounter for screening for depression: Secondary | ICD-10-CM | POA: Diagnosis not present

## 2019-02-25 DIAGNOSIS — C50919 Malignant neoplasm of unspecified site of unspecified female breast: Secondary | ICD-10-CM | POA: Diagnosis not present

## 2019-02-25 DIAGNOSIS — F329 Major depressive disorder, single episode, unspecified: Secondary | ICD-10-CM | POA: Diagnosis not present

## 2019-02-25 DIAGNOSIS — D72819 Decreased white blood cell count, unspecified: Secondary | ICD-10-CM | POA: Diagnosis not present

## 2019-02-25 DIAGNOSIS — Z0000 Encounter for general adult medical examination without abnormal findings: Secondary | ICD-10-CM | POA: Diagnosis not present

## 2019-02-25 DIAGNOSIS — E538 Deficiency of other specified B group vitamins: Secondary | ICD-10-CM | POA: Diagnosis not present

## 2019-02-25 DIAGNOSIS — G500 Trigeminal neuralgia: Secondary | ICD-10-CM | POA: Diagnosis not present

## 2019-02-25 DIAGNOSIS — E669 Obesity, unspecified: Secondary | ICD-10-CM | POA: Diagnosis not present

## 2019-03-09 DIAGNOSIS — E538 Deficiency of other specified B group vitamins: Secondary | ICD-10-CM | POA: Diagnosis not present

## 2019-03-23 DIAGNOSIS — N3000 Acute cystitis without hematuria: Secondary | ICD-10-CM | POA: Diagnosis not present

## 2019-04-26 DIAGNOSIS — Z08 Encounter for follow-up examination after completed treatment for malignant neoplasm: Secondary | ICD-10-CM | POA: Diagnosis not present

## 2019-04-26 DIAGNOSIS — Z853 Personal history of malignant neoplasm of breast: Secondary | ICD-10-CM | POA: Diagnosis not present

## 2019-04-26 DIAGNOSIS — Z170 Estrogen receptor positive status [ER+]: Secondary | ICD-10-CM | POA: Diagnosis not present

## 2019-04-26 DIAGNOSIS — Z87891 Personal history of nicotine dependence: Secondary | ICD-10-CM | POA: Diagnosis not present

## 2019-04-26 DIAGNOSIS — C50911 Malignant neoplasm of unspecified site of right female breast: Secondary | ICD-10-CM | POA: Diagnosis not present

## 2019-05-05 ENCOUNTER — Telehealth: Payer: Self-pay | Admitting: Internal Medicine

## 2019-05-05 NOTE — Telephone Encounter (Signed)
Patient is scheduled to see the PA on 7-9 for cramping but would like to know if there is something she can do for the time being.

## 2019-05-06 ENCOUNTER — Other Ambulatory Visit: Payer: Self-pay

## 2019-05-06 ENCOUNTER — Other Ambulatory Visit: Payer: PPO

## 2019-05-06 DIAGNOSIS — K529 Noninfective gastroenteritis and colitis, unspecified: Secondary | ICD-10-CM

## 2019-05-06 DIAGNOSIS — R197 Diarrhea, unspecified: Secondary | ICD-10-CM

## 2019-05-06 DIAGNOSIS — A084 Viral intestinal infection, unspecified: Secondary | ICD-10-CM | POA: Diagnosis not present

## 2019-05-06 DIAGNOSIS — Z20818 Contact with and (suspected) exposure to other bacterial communicable diseases: Secondary | ICD-10-CM | POA: Diagnosis not present

## 2019-05-06 DIAGNOSIS — Z20828 Contact with and (suspected) exposure to other viral communicable diseases: Secondary | ICD-10-CM | POA: Diagnosis not present

## 2019-05-06 DIAGNOSIS — K219 Gastro-esophageal reflux disease without esophagitis: Secondary | ICD-10-CM | POA: Diagnosis not present

## 2019-05-06 DIAGNOSIS — R110 Nausea: Secondary | ICD-10-CM | POA: Diagnosis not present

## 2019-05-06 MED ORDER — HYOSCYAMINE SULFATE 0.125 MG SL SUBL: 0.1250 mg | SUBLINGUAL_TABLET | Freq: Four times a day (QID) | SUBLINGUAL | 1 refills | Status: DC | PRN

## 2019-05-06 NOTE — Telephone Encounter (Signed)
Left message for pt to call back  °

## 2019-05-06 NOTE — Telephone Encounter (Signed)
Ok to refill Would check GI pathogen panel

## 2019-05-06 NOTE — Telephone Encounter (Signed)
Spoke with pt and she is aware. Script sent to pharmacy, order in epic for lab.

## 2019-05-06 NOTE — Telephone Encounter (Signed)
Pt states she had ischemic colitis in 2017. Several weeks ago she started having nausea, abdominal cramping and 2 days ago she started with diarrhea. She states she is having gas and bloating also. Pt has scheduled an OV with [facility] 7/7. Pt calling requesting a refill on hyoscyamine that she had in 2017 until her OV. Please advise.

## 2019-05-07 ENCOUNTER — Telehealth: Payer: Self-pay | Admitting: Internal Medicine

## 2019-05-07 ENCOUNTER — Other Ambulatory Visit: Payer: PPO

## 2019-05-07 DIAGNOSIS — R197 Diarrhea, unspecified: Secondary | ICD-10-CM

## 2019-05-07 DIAGNOSIS — K529 Noninfective gastroenteritis and colitis, unspecified: Secondary | ICD-10-CM | POA: Diagnosis not present

## 2019-05-07 NOTE — Telephone Encounter (Signed)
Pt would like to discuss the stool pathogen test that she is to take.  She reported that instructions state that specimen is to be sent back within 24 h, but she is taking an anti diarrheal medicine.

## 2019-05-07 NOTE — Telephone Encounter (Signed)
Pt wanted to know if the lab is open on the weekend, reviewed the lab hours with pt and she is aware.

## 2019-05-12 ENCOUNTER — Telehealth: Payer: Self-pay | Admitting: Internal Medicine

## 2019-05-12 NOTE — Telephone Encounter (Signed)
Patient called and wanted to speak with the nurse about stool sample results.

## 2019-05-12 NOTE — Telephone Encounter (Signed)
Called and let pt know that the results are not back yet but she will be called with the results when we receive them.

## 2019-05-17 LAB — GASTROINTESTINAL PATHOGEN PANEL PCR
C. difficile Tox A/B, PCR: UNDETERMINED — AB
Campylobacter, PCR: UNDETERMINED — AB
Cryptosporidium, PCR: UNDETERMINED — AB
E coli (ETEC) LT/ST PCR: UNDETERMINED — AB
E coli (STEC) stx1/stx2, PCR: UNDETERMINED — AB
E coli 0157, PCR: UNDETERMINED — AB
Giardia lamblia, PCR: UNDETERMINED — AB
Norovirus, PCR: UNDETERMINED — AB
Rotavirus A, PCR: UNDETERMINED — AB
Salmonella, PCR: UNDETERMINED — AB
Shigella, PCR: UNDETERMINED — AB

## 2019-05-20 ENCOUNTER — Ambulatory Visit: Payer: PPO | Admitting: Nurse Practitioner

## 2019-06-03 DIAGNOSIS — K5792 Diverticulitis of intestine, part unspecified, without perforation or abscess without bleeding: Secondary | ICD-10-CM | POA: Diagnosis not present

## 2019-06-03 DIAGNOSIS — R110 Nausea: Secondary | ICD-10-CM | POA: Diagnosis not present

## 2019-06-03 DIAGNOSIS — I10 Essential (primary) hypertension: Secondary | ICD-10-CM | POA: Diagnosis not present

## 2019-06-15 ENCOUNTER — Ambulatory Visit: Payer: PPO | Admitting: Gastroenterology

## 2019-06-19 ENCOUNTER — Other Ambulatory Visit: Payer: Self-pay

## 2019-06-19 ENCOUNTER — Emergency Department (HOSPITAL_BASED_OUTPATIENT_CLINIC_OR_DEPARTMENT_OTHER)
Admission: EM | Admit: 2019-06-19 | Discharge: 2019-06-19 | Disposition: A | Discharge status: Home / Self Care | Payer: PPO | Attending: Emergency Medicine | Admitting: Emergency Medicine

## 2019-06-19 ENCOUNTER — Emergency Department (HOSPITAL_BASED_OUTPATIENT_CLINIC_OR_DEPARTMENT_OTHER): Payer: PPO

## 2019-06-19 DIAGNOSIS — Z853 Personal history of malignant neoplasm of breast: Secondary | ICD-10-CM | POA: Diagnosis not present

## 2019-06-19 DIAGNOSIS — K5792 Diverticulitis of intestine, part unspecified, without perforation or abscess without bleeding: Secondary | ICD-10-CM | POA: Insufficient documentation

## 2019-06-19 DIAGNOSIS — Z79899 Other long term (current) drug therapy: Secondary | ICD-10-CM | POA: Diagnosis not present

## 2019-06-19 DIAGNOSIS — I10 Essential (primary) hypertension: Secondary | ICD-10-CM | POA: Diagnosis not present

## 2019-06-19 DIAGNOSIS — R1032 Left lower quadrant pain: Secondary | ICD-10-CM | POA: Diagnosis present

## 2019-06-19 DIAGNOSIS — Z87891 Personal history of nicotine dependence: Secondary | ICD-10-CM | POA: Insufficient documentation

## 2019-06-19 DIAGNOSIS — K8689 Other specified diseases of pancreas: Secondary | ICD-10-CM | POA: Diagnosis not present

## 2019-06-19 LAB — CBC
HCT: 41 % (ref 36.0–46.0)
Hemoglobin: 13.3 g/dL (ref 12.0–15.0)
MCH: 30.6 pg (ref 26.0–34.0)
MCHC: 32.4 g/dL (ref 30.0–36.0)
MCV: 94.3 fL (ref 80.0–100.0)
Platelets: 350 10*3/uL (ref 150–400)
RBC: 4.35 MIL/uL (ref 3.87–5.11)
RDW: 12.7 % (ref 11.5–15.5)
WBC: 6.3 10*3/uL (ref 4.0–10.5)
nRBC: 0 % (ref 0.0–0.2)

## 2019-06-19 LAB — COMPREHENSIVE METABOLIC PANEL
ALT: 26 U/L (ref 0–44)
AST: 30 U/L (ref 15–41)
Albumin: 3.8 g/dL (ref 3.5–5.0)
Alkaline Phosphatase: 104 U/L (ref 38–126)
Anion gap: 11 (ref 5–15)
BUN: 19 mg/dL (ref 8–23)
CO2: 24 mmol/L (ref 22–32)
Calcium: 9.4 mg/dL (ref 8.9–10.3)
Chloride: 98 mmol/L (ref 98–111)
Creatinine, Ser: 0.83 mg/dL (ref 0.44–1.00)
GFR calc Af Amer: >60 mL/min (ref >60)
GFR calc non Af Amer: >60 mL/min (ref >60)
Glucose, Bld: 77 mg/dL (ref 70–99)
Potassium: 4.2 mmol/L (ref 3.5–5.1)
Sodium: 133 mmol/L — ABNORMAL LOW (ref 135–145)
Total Bilirubin: 0.5 mg/dL (ref 0.3–1.2)
Total Protein: 7.1 g/dL (ref 6.5–8.1)

## 2019-06-19 LAB — URINALYSIS, ROUTINE W REFLEX MICROSCOPIC
Bilirubin Urine: NEGATIVE
Glucose, UA: NEGATIVE mg/dL
Hgb urine dipstick: NEGATIVE
Ketones, ur: NEGATIVE mg/dL
Leukocytes,Ua: NEGATIVE
Nitrite: NEGATIVE
Protein, ur: NEGATIVE mg/dL
Specific Gravity, Urine: 1.02 (ref 1.005–1.030)
pH: 6 (ref 5.0–8.0)

## 2019-06-19 LAB — LIPASE, BLOOD: Lipase: 26 U/L (ref 11–51)

## 2019-06-19 MED ORDER — SODIUM CHLORIDE 0.9% FLUSH
3.0000 mL | Freq: Once | INTRAVENOUS | Status: DC
  Filled 2019-06-19: qty 3

## 2019-06-19 MED ORDER — METRONIDAZOLE 500 MG PO TABS
500.0000 mg | ORAL_TABLET | Freq: Once | ORAL | Status: AC
  Administered 2019-06-19: 500 mg via ORAL
  Filled 2019-06-19: qty 1

## 2019-06-19 MED ORDER — METRONIDAZOLE 500 MG PO TABS: 500.0000 mg | ORAL_TABLET | Freq: Two times a day (BID) | ORAL | 0 refills | Status: DC

## 2019-06-19 MED ORDER — CIPROFLOXACIN HCL 500 MG PO TABS
500.0000 mg | ORAL_TABLET | Freq: Once | ORAL | Status: AC
  Administered 2019-06-19: 17:00:00 500 mg via ORAL
  Filled 2019-06-19: qty 1

## 2019-06-19 MED ORDER — IOHEXOL 300 MG/ML  SOLN
100.0000 mL | Freq: Once | INTRAMUSCULAR | Status: AC | PRN
  Administered 2019-06-19: 100 mL via INTRAVENOUS

## 2019-06-19 MED ORDER — CIPROFLOXACIN HCL 500 MG PO TABS: 500.0000 mg | ORAL_TABLET | Freq: Two times a day (BID) | ORAL | 0 refills | Status: DC

## 2019-06-19 NOTE — Discharge Instructions (Addendum)
You were seen in the emergency department for 5 days of left lower quadrant abdominal pain.  Your blood work was unremarkable but your CAT scan showed evidence of acute diverticulitis.  This will be treated with 2 antibiotics that should finish.  Please follow-up with your doctor and if you experience any severe pain or high fevers return to the emergency department.

## 2019-06-19 NOTE — ED Triage Notes (Signed)
[name] presents with LLQ abdomen pain, she has a hx of diverticulitis and this feels the same. This began 8/2 and has gotten worse. Asssociated with n/v and fatigue. Denies chills. Denies hemachezia.

## 2019-06-19 NOTE — ED Provider Notes (Signed)
[facility] EMERGENCY DEPARTMENT Provider Note   CSN: [id number] Arrival date & time: 06/19/19  1409     History   Chief Complaint Chief Complaint  Patient presents with  . Abdominal Pain    HPI [name] is a 78 y.o. female.  She has a remote history of breast cancer and is also had diverticulitis.  She said she had an episode of lower abdominal pain 2 months ago and then again last month.  Her last month's episode they gave her a course of Cipro and Flagyl.  She started again with some stabbing lower abdominal pain about 5 days ago that is been intermittent but progressively getting worse since then.  She called the covering PCP and they did not want to give her another course of antibiotics and recommended that she come and be seen.  She said she is been a little more constipated than usual and has not had a fever but had some chills.  She said the pain kept her up last night.  No blood from above or below.  No urinary symptoms no rashes.     The history is provided by the patient.  Abdominal Pain Pain location:  LLQ Pain quality: cramping and stabbing   Pain radiates to:  Does not radiate Pain severity:  Moderate Onset quality:  Gradual Duration:  6 days Timing:  Constant Progression:  Worsening Chronicity:  Recurrent Context: not recent travel, not sick contacts and not trauma   Relieved by:  None tried Worsened by:  Nothing Ineffective treatments:  None tried Associated symptoms: chills, constipation, nausea and vomiting   Associated symptoms: no chest pain, no cough, no diarrhea, no dysuria, no fever, no hematemesis, no hematochezia, no hematuria, no melena, no shortness of breath and no sore throat     Past Medical History:  Diagnosis Date  . Allergic rhinitis   . Breast cancer ([place]) 2011   right side  . Diverticulitis   . Diverticulosis   . Endometriosis   . Hypertension   . Tubular adenoma of colon     Patient Active Problem List   Diagnosis  Date Noted  . Colitis, nonspecific 04/01/2016  . Osteopenia 05/08/2015  . Malignant neoplasm of upper-outer quadrant of right breast in female, estrogen receptor positive ([place]) 04/21/2014  . Rhinitis, allergic to other allergen 09/23/2011  . Allergy history, penicillin 09/23/2011  . Cough 06/26/2011    Past Surgical History:  Procedure Laterality Date  . APPENDECTOMY  1988  . BACK SURGERY  09/1999  . BREAST LUMPECTOMY Right 09/04/2010   w/ Sentinel Node Biopsy  . BREAST LUMPECTOMY  8/81962   Fibroadenoma. Left  . carpel tunnel Right last five years (04/19/15)  . CATARACT EXTRACTION W/ INTRAOCULAR LENS  IMPLANT, BILATERAL Bilateral 05/15/16 and 05/09/16  . COLONOSCOPY    . LIPOMA EXCISION  08/2002   L shoulder   . NASAL SINUS SURGERY  1994  . OCCIPITAL NEURECTOMY     Nerve block  . pvd Right   . rectal fissure repair  11/1955  . TOTAL ABDOMINAL HYSTERECTOMY  1988  . TRIGGER FINGER RELEASE     x3     OB History    Gravida  2   Para  2   Term      Preterm      AB      Living  2     SAB      TAB      Ectopic  Multiple      Live Births               Home Medications    Prior to Admission medications   Medication Sig Start Date End Date Taking? Authorizing Provider  buPROPion (WELLBUTRIN XL) 150 MG 24 hr tablet Take 150 mg by mouth daily.    [provider]  CALCIUM & MAGNESIUM CARBONATES PO Take 1 tablet by mouth daily.    [provider]  [facility] SALINE NOSE SPRAY NA Place 1 spray into the nose 2 (two) times daily as needed. Nasal congestion.    [provider]  CYANOCOBALAMIN IJ Inject 1 vial as directed See admin instructions. Every three months. Patient had shot on 5/8.    [provider]  CYANOCOBALAMIN PO Take 1 tablet by mouth daily.    [provider]  denosumab (PROLIA) 60 MG/ML SOLN injection Inject 60 mg into the skin every 6 (six) months. Administer in upper arm, thigh, or abdomen    [provider]  fluticasone (FLONASE) 50 MCG/ACT nasal spray Place 2 sprays into the nose as needed.      [provider]  hyoscyamine (LEVSIN SL) 0.125 MG SL tablet Place 1 tablet (0.125 mg total) under the tongue every 6 (six) hours as needed. 05/06/19   Pyrtle, Lajuan Lines, MD  letrozole [facility]) 2.5 MG tablet Take 1 tablet (2.5 mg total) by mouth daily. 03/23/18   Magrinat, Virgie Dad, MD  nebivolol (BYSTOLIC) 5 MG tablet Take 5 mg by mouth daily.      [provider]  ondansetron (ZOFRAN-ODT) 8 MG disintegrating tablet Take 1 tablet (8 mg total) by mouth every 8 (eight) hours as needed for nausea or vomiting. 07/09/16   Pyrtle, Lajuan Lines, MD  polyethylene glycol (MIRALAX / GLYCOLAX) packet Take 17 g by mouth daily.    [provider]  valACYclovir (VALTREX) 500 MG tablet Take 500 mg by mouth 2 (two) times daily.    [provider]    Family History Family History  Problem Relation Age of Onset  . Heart disease Father   . Lung cancer Father        smoker  . Heart attack Father   . Heart disease Mother   . Melanoma Daughter     Social History Social History   Tobacco Use  . Smoking status: Former Smoker    Packs/day: 1.00    Years: 10.00    Pack years: 10.00    Types: Cigarettes    Quit date: 11/11/1978    Years since quitting: 40.6  . Smokeless tobacco: Never Used  Substance Use Topics  . Alcohol use: No    Alcohol/week: 2.0 standard drinks    Types: 2 Glasses of wine per week  . Drug use: No     Allergies   Patient has no known allergies.   Review of Systems Review of Systems  Constitutional: Positive for chills. Negative for fever.  HENT: Negative for sore throat.   Eyes: Negative for visual disturbance.  Respiratory: Negative for cough and shortness of breath.   Cardiovascular: Negative for chest pain.  Gastrointestinal: Positive for abdominal pain, constipation, nausea and vomiting. Negative for diarrhea, hematemesis, hematochezia and  melena.  Genitourinary: Negative for dysuria and hematuria.  Musculoskeletal: Negative for back pain.  Skin: Negative for rash.  Neurological: Negative for headaches.     Physical Exam Updated Vital Signs BP 126/79 (BP Location: Right Arm)   Pulse 80  Temp 98.2 F (36.8 C) (Oral)   Resp 18   Ht 5\' 1"  (1.549 m)   Wt 71.2 kg   SpO2 99%   BMI 29.66 kg/m   Physical Exam Vitals signs and nursing note reviewed.  Constitutional:      General: She is not in acute distress.    Appearance: She is well-developed.  HENT:     Head: Normocephalic and atraumatic.  Eyes:     Conjunctiva/sclera: Conjunctivae normal.  Neck:     Musculoskeletal: Neck supple.  Cardiovascular:     Rate and Rhythm: Normal rate and regular rhythm.     Heart sounds: No murmur.  Pulmonary:     Effort: Pulmonary effort is normal. No respiratory distress.     Breath sounds: Normal breath sounds.  Abdominal:     General: Bowel sounds are normal.     Palpations: Abdomen is soft. There is no mass or pulsatile mass.     Tenderness: There is abdominal tenderness in the left lower quadrant.  Musculoskeletal: Normal range of motion.     Right lower leg: No edema.     Left lower leg: No edema.  Skin:    General: Skin is warm and dry.     Capillary Refill: Capillary refill takes less than 2 seconds.  Neurological:     General: No focal deficit present.     Mental Status: She is alert and oriented to person, place, and time.      ED Treatments / Results  Labs (all labs ordered are listed, but only abnormal results are displayed) Labs Reviewed  COMPREHENSIVE METABOLIC PANEL - Abnormal; Notable for the following components:      Result Value   Sodium 133 (*)    All other components within normal limits  LIPASE, BLOOD  CBC  URINALYSIS, ROUTINE W REFLEX MICROSCOPIC    EKG None  Radiology Ct Abdomen Pelvis W Contrast  Result Date: 06/19/2019 CLINICAL DATA:  Left lower quadrant pain for 6 days with  history of diverticulitis. Breast cancer. Endometriosis. Fatigue. EXAM: CT ABDOMEN AND PELVIS WITH CONTRAST TECHNIQUE: Multidetector CT imaging of the abdomen and pelvis was performed using the standard protocol following bolus administration of intravenous contrast. CONTRAST:  171mL OMNIPAQUE IOHEXOL 300 MG/ML  SOLN COMPARISON:  04/01/2016 FINDINGS: Lower chest: Clear lung bases. Normal heart size without pericardial or pleural effusion. Hepatobiliary: Normal liver. Normal gallbladder, without biliary ductal dilatation. Pancreas: Mild pancreatic atrophy, within normal variation for age. Upper normal duct size, without acute inflammation. Spleen: Normal in size, without focal abnormality. Adrenals/Urinary Tract: Normal adrenal glands. Normal kidneys, without hydronephrosis. Normal urinary bladder. Stomach/Bowel: Normal stomach, without wall thickening. Extensive colonic diverticulosis. Pericolonic edema is identified adjacent the descending/sigmoid junction, including on image 85/2. No pericolonic fluid collection or free perforation. Normal terminal ileum. Normal small bowel. Vascular/Lymphatic: Normal caliber of the aorta and branch vessels. No abdominopelvic adenopathy. Reproductive: Hysterectomy.  No adnexal mass. Other: No significant free fluid. Mild pelvic floor laxity. Musculoskeletal: Lumbosacral spondylosis including degenerative disc disease L3-4 and L5-S1. IMPRESSION: 1. Uncomplicated descending/sigmoid junction diverticulitis. 2. Pelvic floor laxity. Electronically Signed   By: Abigail Miyamoto M.D.   On: 06/19/2019 16:47    Procedures Procedures (including critical care time)  Medications Ordered in ED Medications  sodium chloride flush (NS) 0.9 % injection 3 mL (3 mLs Intravenous Not Given 06/19/19 1443)     Initial Impression / Assessment and Plan / ED Course  I have reviewed the triage vital signs and the  nursing notes.  Pertinent labs & imaging results that were available during my care of  the patient were reviewed by me and considered in my medical decision making (see chart for details).  Clinical Course as of Jun 18 1920  Sat Aug 08, 390  [id number] 78 year old female with history of diverticulitis here with 5 days of progressive lower abdominal pain mostly left lower quadrant.  Afebrile here and soft abdominal exam although some focal tenderness.  No peritoneal signs.  Differential includes gastroenteritis, colitis, diverticulitis.    [MB]  1703 Patient CT reviewed by me.  Is read by radiology as uncomplicated diverticulitis.  Reviewed this with the patient and she is comfortable going home on Cipro and Flagyl which we will send to her pharmacy and give her first dose here.   [MB]    Clinical Course User Index [MB] Hayden Rasmussen, MD        Final Clinical Impressions(s) / ED Diagnoses   Final diagnoses:  Acute diverticulitis    ED Discharge Orders         Ordered    ciprofloxacin (CIPRO) 500 MG tablet  2 times daily     06/19/19 1705    metroNIDAZOLE (FLAGYL) 500 MG tablet  2 times daily     06/19/19 1705           Hayden Rasmussen, MD 06/19/19 1921

## 2019-06-19 NOTE — ED Notes (Signed)
Patient transported to CT 

## 2019-06-24 ENCOUNTER — Encounter: Payer: Self-pay | Admitting: Physician Assistant

## 2019-06-24 ENCOUNTER — Ambulatory Visit: Payer: PPO | Admitting: Physician Assistant

## 2019-06-24 VITALS — BP 98/60 | HR 80 | Temp 98.0°F | Ht 60.75 in | Wt 156.0 lb

## 2019-06-24 DIAGNOSIS — Z1211 Encounter for screening for malignant neoplasm of colon: Secondary | ICD-10-CM

## 2019-06-24 DIAGNOSIS — Z86010 Personal history of colonic polyps: Secondary | ICD-10-CM | POA: Diagnosis not present

## 2019-06-24 DIAGNOSIS — Z8719 Personal history of other diseases of the digestive system: Secondary | ICD-10-CM

## 2019-06-24 DIAGNOSIS — K5792 Diverticulitis of intestine, part unspecified, without perforation or abscess without bleeding: Secondary | ICD-10-CM

## 2019-06-24 MED ORDER — CIPROFLOXACIN HCL 500 MG PO TABS: 500.0000 mg | ORAL_TABLET | Freq: Two times a day (BID) | ORAL | 0 refills | Status: DC

## 2019-06-24 MED ORDER — METRONIDAZOLE 500 MG PO TABS: 500.0000 mg | ORAL_TABLET | Freq: Two times a day (BID) | ORAL | 0 refills | Status: DC

## 2019-06-24 NOTE — Progress Notes (Signed)
Subjective:    Patient ID: [name], female    DOB: 04/05/1941, 78 y.o.   MRN: [id number]  HPI [name] is a pleasant 78 year old white female, known to Dr. Hilarie Sparks with history of diverticular disease, adenomatous colon polyps and previous history of ischemic colitis.  She also has history of breast cancer stage IIb and is status post hysterectomy. Patient comes in today with 2 recent episodes of diverticulitis.  Last colonoscopy was done in July 2017 with 2 polyps removed 4 to 6 mm and was noted to have multiple small and large mouth diverticuli primarily in the sigmoid colon.  Path on the polyps consistent with tubular adenoma and sessile serrated adenoma.  Patient says she had an episode of abdominal pain and diarrhea in late June.  She did have blood work done through Dr. Silvestre Sparks office and was told that she had a gastroenteritis.  She called here but was unable to be seen.  GI path panel was done which was negative.  Those symptoms resolved. In mid July she had onset of acute abdominal pain, similar to prior episodes of diverticulitis.  Again she was unable to be seen here and was seen at Dr. Silvestre Sparks office and treated with a 7-day course of Cipro and Flagyl.  She had made a follow-up appointment here, which was subsequently canceled, and she is frustrated with inability to be seen in GI. She thought the episode from mid July had resolved but by 8 2 her pain was back again, with sharp pain in the left lower quadrant.  She had some persistence of pain and generally has not felt well.  She was seen in the [facility] and had CT of the abdomen and pelvis done on 06/19/2019 which showed mild atrophy of the pancreas, there was pericolonic edema adjacent to the descending and sigmoid colon consistent with diverticulitis and noted extensive colonic diverticulosis. Labs were unremarkable.  She was again started on Cipro 500 mg p.o. twice daily and metronidazole 500 mg p.o. 3 times daily. She had a  bad day yesterday with increasing pain, but says remarkably she feels much better today and has not been having any significant pain.  She has not had any fever, may have had some chills.  She did take Zofran yesterday for nausea and has been taking Levsin as needed.  She is been eating a very bland diet.  Bowel movements have been moving but has had some mild constipation.  No bleeding  Review of Systems Pertinent positive and negative review of systems were noted in the above HPI section.  All other review of systems was otherwise negative.  Outpatient Encounter Medications as of 06/24/2019  Medication Sig  . buPROPion (WELLBUTRIN XL) 150 MG 24 hr tablet Take 150 mg by mouth daily.  Marland Kitchen CALCIUM & MAGNESIUM CARBONATES PO Take 1 tablet by mouth daily.  . ciprofloxacin (CIPRO) 500 MG tablet Take 1 tablet (500 mg total) by mouth 2 (two) times daily.  . [facility] SALINE NOSE SPRAY NA Place 1 spray into the nose 2 (two) times daily as needed. Nasal congestion.  . CYANOCOBALAMIN IJ Inject 1 vial as directed See admin instructions. Every three months. Patient had shot on 5/8.  Marland Kitchen CYANOCOBALAMIN PO Take 1 tablet by mouth daily.  . fluticasone (FLONASE) 50 MCG/ACT nasal spray Place 2 sprays into the nose as needed.    . hyoscyamine (LEVSIN SL) 0.125 MG SL tablet Place 1 tablet (0.125 mg total) under the tongue every 6 (six)  hours as needed.  . metroNIDAZOLE (FLAGYL) 500 MG tablet Take 1 tablet (500 mg total) by mouth 2 (two) times daily.  . nebivolol (BYSTOLIC) 5 MG tablet Take 5 mg by mouth daily.    . ondansetron (ZOFRAN-ODT) 8 MG disintegrating tablet Take 1 tablet (8 mg total) by mouth every 8 (eight) hours as needed for nausea or vomiting.  . polyethylene glycol (MIRALAX / GLYCOLAX) packet Take 17 g by mouth daily.  . [DISCONTINUED] ciprofloxacin (CIPRO) 500 MG tablet Take 1 tablet (500 mg total) by mouth 2 (two) times daily.  . [DISCONTINUED] metroNIDAZOLE (FLAGYL) 500 MG tablet Take 1 tablet (500 mg total)  by mouth 2 (two) times daily.  . [DISCONTINUED] denosumab (PROLIA) 60 MG/ML SOLN injection Inject 60 mg into the skin every 6 (six) months. Administer in upper arm, thigh, or abdomen  . [DISCONTINUED] letrozole (FEMARA) 2.5 MG tablet Take 1 tablet (2.5 mg total) by mouth daily. (Patient not taking: Reported on 06/24/2019)  . [DISCONTINUED] valACYclovir (VALTREX) 500 MG tablet Take 500 mg by mouth 2 (two) times daily.   No facility-administered encounter medications on file as of 06/24/2019.    No Known Allergies Patient Active Problem List   Diagnosis Date Noted  . Hx of diverticulitis of colon 06/24/2019  . Hx of adenomatous colonic polyps 06/24/2019  . Colitis, nonspecific 04/01/2016  . Osteopenia 05/08/2015  . Malignant neoplasm of upper-outer quadrant of right breast in female, estrogen receptor positive ([place]) 04/21/2014  . Rhinitis, allergic to other allergen 09/23/2011  . Allergy history, penicillin 09/23/2011  . Cough 06/26/2011   Social History   Socioeconomic History  . Marital status: Married    Spouse name: Not on file  . Number of children: 2  . Years of education: Not on file  . Highest education level: Not on file  Occupational History  . Occupation: fundraising consultant-retired  Social Needs  . Financial resource strain: Not on file  . Food insecurity    Worry: Not on file    Inability: Not on file  . Transportation needs    Medical: Not on file    Non-medical: Not on file  Tobacco Use  . Smoking status: Former Smoker    Packs/day: 1.00    Years: 10.00    Pack years: 10.00    Types: Cigarettes    Quit date: 11/11/1978    Years since quitting: 40.6  . Smokeless tobacco: Never Used  Substance and Sexual Activity  . Alcohol use: No    Alcohol/week: 2.0 standard drinks    Types: 2 Glasses of wine per week  . Drug use: No  . Sexual activity: Yes  Lifestyle  . Physical activity    Days per week: Not on file    Minutes per session: Not on file  . Stress:  Not on file  Relationships  . Social Herbalist on phone: Not on file    Gets together: Not on file    Attends religious service: Not on file    Active member of club or organization: Not on file    Attends meetings of clubs or organizations: Not on file    Relationship status: Not on file  . Intimate partner violence    Fear of current or ex partner: Not on file    Emotionally abused: Not on file    Physically abused: Not on file    Forced sexual activity: Not on file  Other Topics Concern  . Not on file  Social History Narrative  . Not on file    Ms. Overbeck's family history includes Heart attack in her father; Heart disease in her father and mother; Lung cancer in her father; Melanoma in her daughter.      Objective:    Vitals:   06/24/19 1033  BP: 98/60  Pulse: 80  Temp: 98 F (36.7 C)  SpO2: 99%    Physical Exam Well-developed well-nourished elderly white female in no acute distress.  Accompanied by her husband height, Weight, 156 BMI 29.7  HEENT; nontraumatic normocephalic, EOMI, PER R LA, sclera anicteric. Oropharynx; not examined, wearing mask, COVID Neck; supple, no JVD Cardiovascular; regular rate and rhythm with S1-S2, no murmur rub or gallop Pulmonary; Clear bilaterally Abdomen; soft, minimally tender left lower quadrant and suprapubic no guarding,, nondistended, no palpable mass or hepatosplenomegaly, bowel sounds are active Rectal; not done today Skin; benign exam, no jaundice rash or appreciable lesions Extremities; no clubbing cyanosis or edema skin warm and dry Neuro/Psych; alert and oriented x4, grossly nonfocal mood and affect appropriate       Assessment & Plan:   #33 78 year old white female with prior history of diverticulitis, who presents with an episode of diverticulitis onset mid July, treated with 7-day course of Cipro and Flagyl with improvement of symptoms, then recurrence of pain within 2 weeks which progressed. CT on   06/13/2019 consistent with sigmoid diverticulitis, uncomplicated.  She experienced increased pain yesterday but is feeling better significantly today. Abdominal exam is benign.  I think she has  had a prolonged episode of diverticulitis which had not completely resolved, after initial course of antibiotics.  #2 history of adenomatous and sessile serrated adenomas-up-to-date with colonoscopy, due for follow-up July 2022 #3 breast cancer stage IIb  Plan; gradually advance diet as tolerates with goal to high-fiber diet chronically Continue Cipro 500 mg p.o. twice daily, she is now taking metronidazole 500 mg p.o. twice daily.  Would like her to complete a 14-day course of antibiotics, have called in another 4-day supply. She has had some nausea likely secondary to antibiotics and will continue Zofran 4 mg every 6 hours as needed MiraLAX 17 g in 8 ounces of water every other day Patient was asked to call for advice if she develops any worsening of symptoms over the next week or so and/or if her symptoms have not completely resolved when she finishes the antibiotics.  She would need repeat imaging at that time. She does have a follow-up scheduled with Dr. Hilarie Sparks on 07/16/2019 and will keep that for follow-up.     Kallon Caylor S Keimon Basaldua PA-C 06/24/2019   Cc: Crist Infante, MD

## 2019-06-24 NOTE — Patient Instructions (Signed)
If you are age 78 or older, your body mass index should be between 23-30. Your Body mass index is 29.72 kg/m. If this is out of the aforementioned range listed, please consider follow up with your Primary Care Provider.  If you are age 28 or younger, your body mass index should be between 19-25. Your Body mass index is 29.72 kg/m. If this is out of the aformentioned range listed, please consider follow up with your Primary Care Provider.   We have sent the following medications to your pharmacy for you to pick up at your convenience: Flagyl Cipro  Continue Hyoscyamine as needed for cramping or pain.  Use Miralax as needed.  Start Benefiber daily NEXT week.  You have been given a High Fiber Diet.  Keep your appointment with Dr. Hilarie Fredrickson.  Call and speak with Vaughan Basta, RN if symptoms worsen or and are persisting after finishing antibiotics.  Thank you for choosing me and [facility].   Amy Esterwood, PA-C

## 2019-06-29 ENCOUNTER — Encounter: Payer: PPO | Admitting: Obstetrics & Gynecology

## 2019-07-16 ENCOUNTER — Ambulatory Visit: Payer: PPO | Admitting: Internal Medicine

## 2019-08-18 ENCOUNTER — Other Ambulatory Visit: Payer: Self-pay

## 2019-08-18 DIAGNOSIS — Z20822 Contact with and (suspected) exposure to covid-19: Secondary | ICD-10-CM

## 2019-08-18 DIAGNOSIS — Z20828 Contact with and (suspected) exposure to other viral communicable diseases: Secondary | ICD-10-CM | POA: Diagnosis not present

## 2019-08-20 ENCOUNTER — Ambulatory Visit: Payer: PPO | Admitting: Internal Medicine

## 2019-08-20 ENCOUNTER — Encounter: Payer: Self-pay | Admitting: Internal Medicine

## 2019-08-20 VITALS — BP 118/80 | HR 77 | Temp 97.6°F | Ht 60.75 in | Wt 159.0 lb

## 2019-08-20 DIAGNOSIS — K5909 Other constipation: Secondary | ICD-10-CM

## 2019-08-20 DIAGNOSIS — Z8719 Personal history of other diseases of the digestive system: Secondary | ICD-10-CM | POA: Diagnosis not present

## 2019-08-20 DIAGNOSIS — K5790 Diverticulosis of intestine, part unspecified, without perforation or abscess without bleeding: Secondary | ICD-10-CM

## 2019-08-20 LAB — NOVEL CORONAVIRUS, NAA: SARS-CoV-2, NAA: NOT DETECTED

## 2019-08-20 NOTE — Progress Notes (Signed)
Please let patient know covid was negtive

## 2019-08-20 NOTE — Progress Notes (Signed)
Pt aware of neg covid. Nothing further needed.

## 2019-08-20 NOTE — Patient Instructions (Signed)
Please continue Miralax.  Continue your high fiber diet.  Please follow up with Dr Hilarie Fredrickson as needed.  If you are age 78 or older, your body mass index should be between 23-30. Your Body mass index is 30.29 kg/m. If this is out of the aforementioned range listed, please consider follow up with your Primary Care Provider.  If you are age 37 or younger, your body mass index should be between 19-25. Your Body mass index is 30.29 kg/m. If this is out of the aformentioned range listed, please consider follow up with your Primary Care Provider.

## 2019-08-20 NOTE — Progress Notes (Signed)
Subjective:    Patient ID: [name], female    DOB: 1941-06-30, 78 y.o.   MRN: [id number]  HPI [name] is a 78 year old female with a history of diverticular disease with history of diverticulitis, history of adenomatous colon polyps, history of ischemic colitis who is here for follow-up.  She is here alone today.  She also has a history of stage IIb breast cancer, history of prior hysterectomy.  She was seen by Nicoletta Ba, PA-C on 06/24/2019 after a rather prolonged episode with diverticulitis.  She had a CT scan on 06/13/2019 that was consistent with sigmoid diverticulitis.  She took several courses of ciprofloxacin and metronidazole.  Fortunately her diverticulitis has resolved entirely.  She reports that her abdominal pain has resolved.  She continues MiraLAX about every 3 days to help her bowels remain regular.  She had tried Benefiber but has been focusing most recently on a high-fiber diet.  She does eat a very healthy diet and drinks plenty of water.  Her breakfast is raisin Bran or a high fiber protein bar and fresh fruit.  No new upper GI or hepatobiliary complaint today.   Review of Systems As per HPI, otherwise negative  Current Medications, Allergies, Past Medical History, Past Surgical History, Family History and Social History were reviewed in [employer] record.     Objective:   Physical Exam BP 118/80   Pulse 77   Temp 97.6 F (36.4 C)   Ht 5' 0.75" (1.543 m)   Wt 159 lb (72.1 kg)   BMI 30.29 kg/m  Gen: awake, alert, NAD HEENT: anicteric CV: RRR, no mrg Pulm: CTA b/l Abd: soft, NT/ND, +BS throughout Ext: no c/c/e Neuro: nonfocal  CT ABDOMEN AND PELVIS WITH CONTRAST   TECHNIQUE: Multidetector CT imaging of the abdomen and pelvis was performed using the standard protocol following bolus administration of intravenous contrast.   CONTRAST:  143mL OMNIPAQUE IOHEXOL 300 MG/ML  SOLN   COMPARISON:  04/01/2016   FINDINGS:  Lower chest: Clear lung bases. Normal heart size without pericardial or pleural effusion.   Hepatobiliary: Normal liver. Normal gallbladder, without biliary ductal dilatation.   Pancreas: Mild pancreatic atrophy, within normal variation for age. Upper normal duct size, without acute inflammation.   Spleen: Normal in size, without focal abnormality.   Adrenals/Urinary Tract: Normal adrenal glands. Normal kidneys, without hydronephrosis. Normal urinary bladder.   Stomach/Bowel: Normal stomach, without wall thickening. Extensive colonic diverticulosis. Pericolonic edema is identified adjacent the descending/sigmoid junction, including on image 85/2. No pericolonic fluid collection or free perforation. Normal terminal ileum. Normal small bowel.   Vascular/Lymphatic: Normal caliber of the aorta and branch vessels. No abdominopelvic adenopathy.   Reproductive: Hysterectomy.  No adnexal mass.   Other: No significant free fluid. Mild pelvic floor laxity.   Musculoskeletal: Lumbosacral spondylosis including degenerative disc disease L3-4 and L5-S1.   IMPRESSION: 1. Uncomplicated descending/sigmoid junction diverticulitis. 2. Pelvic floor laxity.     Electronically Signed   By: Abigail Miyamoto M.D.   On: 06/19/2019 16:47      Assessment & Plan:  78 year old female with a history of diverticular disease with history of diverticulitis, history of adenomatous colon polyps, history of ischemic colitis who is here for follow-up.   1.  Diverticular disease with recent diverticulitis/chronic constipation --her diverticulitis from July and August 2020 has resolved after 2 courses of antibiotics.  Her bowel habits have returned to normal.  We discussed that she remain on her MiraLAX therapy about every  3 days along with high-fiber diet with liberal water intake.  We also discussed that if she were to develop constipation that she could take 3 or 4 doses of MiraLAX at that time or consider using  a fleets enema.  If she does develop recurrent and consistent abdominal pain or concern of recurrent diverticulitis she is advised to let me know.  She voices understanding and is happy with this plan.  2.  History of adenomatous colon polyps --surveillance colonoscopy recommended July 2022  25 minutes spent with the patient today. Greater than 50% was spent in counseling and coordination of care with the patient

## 2019-08-27 DIAGNOSIS — E538 Deficiency of other specified B group vitamins: Secondary | ICD-10-CM | POA: Diagnosis not present

## 2019-09-08 DIAGNOSIS — D692 Other nonthrombocytopenic purpura: Secondary | ICD-10-CM | POA: Diagnosis not present

## 2019-09-08 DIAGNOSIS — D225 Melanocytic nevi of trunk: Secondary | ICD-10-CM | POA: Diagnosis not present

## 2019-09-08 DIAGNOSIS — L821 Other seborrheic keratosis: Secondary | ICD-10-CM | POA: Diagnosis not present

## 2019-09-08 DIAGNOSIS — D1801 Hemangioma of skin and subcutaneous tissue: Secondary | ICD-10-CM | POA: Diagnosis not present

## 2019-09-08 DIAGNOSIS — I788 Other diseases of capillaries: Secondary | ICD-10-CM | POA: Diagnosis not present

## 2019-09-20 DIAGNOSIS — Z170 Estrogen receptor positive status [ER+]: Secondary | ICD-10-CM | POA: Diagnosis not present

## 2019-09-20 DIAGNOSIS — Z803 Family history of malignant neoplasm of breast: Secondary | ICD-10-CM | POA: Diagnosis not present

## 2019-09-20 DIAGNOSIS — Z853 Personal history of malignant neoplasm of breast: Secondary | ICD-10-CM | POA: Diagnosis not present

## 2019-09-20 DIAGNOSIS — C50411 Malignant neoplasm of upper-outer quadrant of right female breast: Secondary | ICD-10-CM | POA: Diagnosis not present

## 2019-09-20 DIAGNOSIS — Z808 Family history of malignant neoplasm of other organs or systems: Secondary | ICD-10-CM | POA: Diagnosis not present

## 2019-09-20 DIAGNOSIS — Z7183 Encounter for nonprocreative genetic counseling: Secondary | ICD-10-CM | POA: Diagnosis not present

## 2019-10-14 ENCOUNTER — Other Ambulatory Visit: Payer: Self-pay

## 2019-10-14 DIAGNOSIS — Z20822 Contact with and (suspected) exposure to covid-19: Secondary | ICD-10-CM

## 2019-10-17 LAB — NOVEL CORONAVIRUS, NAA: SARS-CoV-2, NAA: NOT DETECTED

## 2019-11-17 DIAGNOSIS — Z9189 Other specified personal risk factors, not elsewhere classified: Secondary | ICD-10-CM | POA: Diagnosis not present

## 2019-11-17 DIAGNOSIS — Z20828 Contact with and (suspected) exposure to other viral communicable diseases: Secondary | ICD-10-CM | POA: Diagnosis not present

## 2019-11-19 ENCOUNTER — Encounter: Payer: PPO | Admitting: Obstetrics & Gynecology

## 2019-11-24 ENCOUNTER — Encounter: Payer: PPO | Admitting: Obstetrics & Gynecology

## 2019-12-05 ENCOUNTER — Ambulatory Visit: Payer: PPO

## 2019-12-15 DIAGNOSIS — R05 Cough: Secondary | ICD-10-CM | POA: Diagnosis not present

## 2019-12-15 DIAGNOSIS — R0981 Nasal congestion: Secondary | ICD-10-CM | POA: Diagnosis not present

## 2019-12-29 DIAGNOSIS — E538 Deficiency of other specified B group vitamins: Secondary | ICD-10-CM | POA: Diagnosis not present

## 2019-12-29 DIAGNOSIS — M8580 Other specified disorders of bone density and structure, unspecified site: Secondary | ICD-10-CM | POA: Diagnosis not present

## 2020-01-20 ENCOUNTER — Encounter: Payer: Self-pay | Admitting: Internal Medicine

## 2020-01-20 ENCOUNTER — Other Ambulatory Visit (INDEPENDENT_AMBULATORY_CARE_PROVIDER_SITE_OTHER): Payer: PPO

## 2020-01-20 ENCOUNTER — Ambulatory Visit: Payer: PPO | Admitting: Internal Medicine

## 2020-01-20 VITALS — BP 122/76 | HR 72 | Temp 97.2°F | Ht 60.75 in | Wt 166.5 lb

## 2020-01-20 DIAGNOSIS — K5909 Other constipation: Secondary | ICD-10-CM | POA: Diagnosis not present

## 2020-01-20 DIAGNOSIS — R110 Nausea: Secondary | ICD-10-CM

## 2020-01-20 DIAGNOSIS — Z8719 Personal history of other diseases of the digestive system: Secondary | ICD-10-CM | POA: Diagnosis not present

## 2020-01-20 LAB — COMPREHENSIVE METABOLIC PANEL
ALT: 15 U/L (ref 0–35)
AST: 17 U/L (ref 0–37)
Albumin: 3.8 g/dL (ref 3.5–5.2)
Alkaline Phosphatase: 97 U/L (ref 39–117)
BUN: 16 mg/dL (ref 6–23)
CO2: 27 mEq/L (ref 19–32)
Calcium: 8.9 mg/dL (ref 8.4–10.5)
Chloride: 99 mEq/L (ref 96–112)
Creatinine, Ser: 0.84 mg/dL (ref 0.40–1.20)
GFR: 65.5 mL/min (ref >60.00)
Glucose, Bld: 89 mg/dL (ref 70–99)
Potassium: 4.4 mEq/L (ref 3.5–5.1)
Sodium: 131 mEq/L — ABNORMAL LOW (ref 135–145)
Total Bilirubin: 0.3 mg/dL (ref 0.2–1.2)
Total Protein: 6.9 g/dL (ref 6.0–8.3)

## 2020-01-20 LAB — CBC WITH DIFFERENTIAL/PLATELET
Basophils Absolute: 0 10*3/uL (ref 0.0–0.1)
Basophils Relative: 0.3 % (ref 0.0–3.0)
Eosinophils Absolute: 0.2 10*3/uL (ref 0.0–0.7)
Eosinophils Relative: 3.8 % (ref 0.0–5.0)
HCT: 38.1 % (ref 36.0–46.0)
Hemoglobin: 13.1 g/dL (ref 12.0–15.0)
Lymphocytes Relative: 28.6 % (ref 12.0–46.0)
Lymphs Abs: 1.6 10*3/uL (ref 0.7–4.0)
MCHC: 34.4 g/dL (ref 30.0–36.0)
MCV: 92.8 fl (ref 78.0–100.0)
Monocytes Absolute: 0.5 10*3/uL (ref 0.1–1.0)
Monocytes Relative: 9.3 % (ref 3.0–12.0)
Neutro Abs: 3.3 10*3/uL (ref 1.4–7.7)
Neutrophils Relative %: 58 % (ref 43.0–77.0)
Platelets: 357 10*3/uL (ref 150.0–400.0)
RBC: 4.1 Mil/uL (ref 3.87–5.11)
RDW: 13.3 % (ref 11.5–15.5)
WBC: 5.7 10*3/uL (ref 4.0–10.5)

## 2020-01-20 NOTE — Progress Notes (Signed)
Subjective:    Patient ID: [name], female    DOB: [birth year], 79 y.o.   MRN: [id number]  HPI [name] is a 79 year old female with a history of diverticular disease with a history of diverticulitis, history of adenomatous colon polyps, history of ischemic colitis is here for follow-up.  She is here alone today.  She also has a history of stage IIb breast cancer and prior hysterectomy.  She contacted me a few months ago with intermittent nausea symptom.  She also describes this as a queasiness.  These attacks are not really associated with abdominal pain or heartburn though she has had feeling of sour stomach.  Seems to occur more frequently before eating on an empty stomach when she feels herself getting hungry.  She tries ginger ale and crackers.  Does not seem to worsen with stress.  I recommended she try PPI and so she started 20 mg of omeprazole daily on November 22, 2019 and between 11/22/2019 and 12/13/2019 she had 5 episodes in 22 days and she has had 5 additional episodes in the last 39 days.  She has only vomited on 1 or 2 occasions.  Zofran helps and she has tried this with almost each episode.  2 or 3 times it did not help and once she took a second dose.  Her bowel movements have been good and complete using MiraLAX about 2 days/week.  Her bowel movements have been daily.  She is not using Levsin.  She also describes during her periodic nausea that she feels "out of focus" but not dizzy.  No chest pain or dyspnea and symptoms are nonexertional   Review of Systems As per HPI, otherwise negative  Current Medications, Allergies, Past Medical History, Past Surgical History, Family History and Social History were reviewed in [employer] record.     Objective:   Physical Exam BP 122/76 (BP Location: Left Arm, Patient Position: Sitting, Cuff Size: Normal)   Pulse 72   Temp (!) 97.2 F (36.2 C)   Ht 5' 0.75" (1.543 m)   Wt 166 lb 8 oz (75.5 kg)   SpO2 99%    BMI 31.72 kg/m  Gen: awake, alert, NAD HEENT: anicteric, op clear CV: RRR, no mrg Pulm: CTA b/l Abd: soft, NT/ND, +BS throughout Ext: no c/c/e Neuro: nonfocal  CT ABDOMEN AND PELVIS WITH CONTRAST   TECHNIQUE: Multidetector CT imaging of the abdomen and pelvis was performed using the standard protocol following bolus administration of intravenous contrast.   CONTRAST:  175mL OMNIPAQUE IOHEXOL 300 MG/ML  SOLN   COMPARISON:  04/01/2016   FINDINGS: Lower chest: Clear lung bases. Normal heart size without pericardial or pleural effusion.   Hepatobiliary: Normal liver. Normal gallbladder, without biliary ductal dilatation.   Pancreas: Mild pancreatic atrophy, within normal variation for age. Upper normal duct size, without acute inflammation.   Spleen: Normal in size, without focal abnormality.   Adrenals/Urinary Tract: Normal adrenal glands. Normal kidneys, without hydronephrosis. Normal urinary bladder.   Stomach/Bowel: Normal stomach, without wall thickening. Extensive colonic diverticulosis. Pericolonic edema is identified adjacent the descending/sigmoid junction, including on image 85/2. No pericolonic fluid collection or free perforation. Normal terminal ileum. Normal small bowel.   Vascular/Lymphatic: Normal caliber of the aorta and branch vessels. No abdominopelvic adenopathy.   Reproductive: Hysterectomy.  No adnexal mass.   Other: No significant free fluid. Mild pelvic floor laxity.   Musculoskeletal: Lumbosacral spondylosis including degenerative disc disease L3-4 and L5-S1.   IMPRESSION: 1. Uncomplicated  descending/sigmoid junction diverticulitis. 2. Pelvic floor laxity.     Electronically Signed   By: Abigail Miyamoto M.D.   On: 06/19/2019 16:47        Assessment & Plan:  79 year old female with a history of diverticular disease with a history of diverticulitis, history of adenomatous colon polyps, history of ischemic colitis is here for  follow-up.   1.  Episodic nausea --unclear if she has benefited from the Prilosec at 20 mg.  I am going to have her stop this to see if symptoms worsen.  Have also recommended we pursue upper endoscopy and abdominal ultrasound.  About check blood counts and metabolic panel today. --Stop omeprazole 20 mg daily though if symptoms worsen we would restart the medicine and even consider increasing to 40 mg a day --CBC and CMP today --Upper endoscopy in the [place], we discussed the risk, benefits and alternatives and she is agreeable and wishes to proceed --Abdominal ultrasound, rule out gallbladder disease  2.  History of diverticulitis/diverticulosis of the colon with mild constipation --continue MiraLAX as needed  3.  History of adenomatous polyps --surveillance colonoscopy due in the summer 2022  30 minutes total spent today including patient facing time, coordination of care, reviewing medical history/procedures/pertinent radiology studies, and documentation of the encounter.

## 2020-01-20 NOTE — Patient Instructions (Signed)
Your provider has requested that you go to the basement level for lab work before leaving today. Press "B" on the elevator. The lab is located at the first door on the left as you exit the elevator. _____________________________________________________ [name] have been scheduled for an endoscopy. Please follow written instructions given to you at your visit today. If you use inhalers (even only as needed), please bring them with you on the day of your procedure. _____________________________________________________ Discontinue omeprazole (prilosec). _____________________________________________________ [name] have been scheduled for an abdominal ultrasound at [facility] (1st floor of hospital) on Thursday, 01/27/20 at 11:00 am. Please arrive 15 minutes prior to your appointment for registration. Make certain not to have anything to eat or drink 6 hours prior to your appointment. Should you need to reschedule your appointment, please contact radiology at [phone]. This test typically takes about 30 minutes to perform. ______________________________________________________ [name]. ______________________________________________________ If you are age 31 or older, your body mass index should be between 23-30. Your Body mass index is 31.72 kg/m. If this is out of the aforementioned range listed, please consider follow up with your Primary Care Provider.  If you are age 75 or younger, your body mass index should be between 19-25. Your Body mass index is 31.72 kg/m. If this is out of the aformentioned range listed, please consider follow up with your Primary Care Provider.  ______________________________________________________ Due to recent changes in healthcare laws, you may see the results of your imaging and laboratory studies on MyChart before your provider has had a chance to review them.  We understand that in some cases there may be results that are confusing or concerning to you.  Not all laboratory results come back in the same time frame and the provider may be waiting for multiple results in order to interpret others.  Please give [place] 48 hours in order for your provider to thoroughly review all the results before contacting the office for clarification of your results.

## 2020-01-24 ENCOUNTER — Encounter: Payer: Self-pay | Admitting: Internal Medicine

## 2020-01-27 ENCOUNTER — Other Ambulatory Visit: Payer: Self-pay

## 2020-01-27 ENCOUNTER — Ambulatory Visit (HOSPITAL_COMMUNITY)
Admission: RE | Admit: 2020-01-27 | Discharge: 2020-01-27 | Disposition: A | Discharge status: Home / Self Care | Payer: PPO | Source: Ambulatory Visit | Attending: Internal Medicine | Admitting: Internal Medicine

## 2020-01-27 DIAGNOSIS — R110 Nausea: Secondary | ICD-10-CM | POA: Diagnosis not present

## 2020-02-04 ENCOUNTER — Other Ambulatory Visit: Payer: Self-pay

## 2020-02-04 ENCOUNTER — Ambulatory Visit (AMBULATORY_SURGERY_CENTER): Payer: PPO | Admitting: Internal Medicine

## 2020-02-04 ENCOUNTER — Encounter: Payer: Self-pay | Admitting: Internal Medicine

## 2020-02-04 VITALS — BP 167/79 | HR 75 | Temp 96.8°F | Resp 17 | Ht 60.0 in | Wt 166.0 lb

## 2020-02-04 DIAGNOSIS — K2950 Unspecified chronic gastritis without bleeding: Secondary | ICD-10-CM

## 2020-02-04 DIAGNOSIS — K2980 Duodenitis without bleeding: Secondary | ICD-10-CM | POA: Diagnosis not present

## 2020-02-04 DIAGNOSIS — I10 Essential (primary) hypertension: Secondary | ICD-10-CM | POA: Diagnosis not present

## 2020-02-04 DIAGNOSIS — R110 Nausea: Secondary | ICD-10-CM | POA: Diagnosis not present

## 2020-02-04 MED ORDER — SODIUM CHLORIDE 0.9 % IV SOLN: 500.0000 mL | Freq: Once | INTRAVENOUS | Status: DC

## 2020-02-04 NOTE — Progress Notes (Signed)
Report to PACU, RN, vss, BBS= Clear.  

## 2020-02-04 NOTE — Op Note (Signed)
[place] Patient Name: [name] Procedure Date: 02/04/2020 11:48 AM MRN: [id number] Endoscopist: Jerene Bears , MD Age: 79 Referring MD:  Date of Birth: 12-20-1940 Gender: Female Account #: [id number] Procedure:                Upper GI endoscopy Indications:              Intermittent unexplained nausea Medicines:                Monitored Anesthesia Care Procedure:                Pre-Anesthesia Assessment:                           - Prior to the procedure, a History and Physical                            was performed, and patient medications and                            allergies were reviewed. The patient's tolerance of                            previous anesthesia was also reviewed. The risks                            and benefits of the procedure and the sedation                            options and risks were discussed with the patient.                            All questions were answered, and informed consent                            was obtained. Prior Anticoagulants: The patient has                            taken no previous anticoagulant or antiplatelet                            agents. ASA Grade Assessment: II - A patient with                            mild systemic disease. After reviewing the risks                            and benefits, the patient was deemed in                            satisfactory condition to undergo the procedure.                           After obtaining informed consent, the endoscope was  passed under direct vision. Throughout the                            procedure, the patient's blood pressure, pulse, and                            oxygen saturations were monitored continuously. The                            Endoscope was introduced through the mouth, and                            advanced to the second part of duodenum. The upper                            GI endoscopy was  accomplished without difficulty.                            The patient tolerated the procedure well. Scope In: Scope Out: Findings:                 The examined esophagus was normal.                           The cardia and gastric fundus were normal on                            retroflexion.                           The entire examined stomach was normal. Biopsies                            were taken with a cold forceps for histology and                            Helicobacter pylori testing (performed given bulbar                            duodenitis to exclude H. Pylori infection).                           Mild inflammation characterized by erythema was                            found in the duodenal bulb.                           The second portion of the duodenum was normal.                            Biopsies were taken with a cold forceps for                            histology. Complications:  No immediate complications. Estimated Blood Loss:     Estimated blood loss was minimal. Impression:               - Normal esophagus.                           - Normal stomach. Biopsied.                           - Duodenitis in bulb.                           - Normal second portion of the duodenum. Biopsied. Recommendation:           - Patient has a contact number available for                            emergencies. The signs and symptoms of potential                            delayed complications were discussed with the                            patient. Return to normal activities tomorrow.                            Written discharge instructions were provided to the                            patient.                           - Resume previous diet.                           - Continue present medications. Will determine if                            symptoms different without omeprazole which was                            stopped at recent office visit.                            - Await pathology results. Jerene Bears, MD 02/04/2020 12:07:45 PM This report has been signed electronically.

## 2020-02-04 NOTE — Patient Instructions (Signed)
Await pathology results.   YOU HAD AN ENDOSCOPIC PROCEDURE TODAY AT THE [facility]:   Refer to the procedure report that was given to you for any specific questions about what was found during the examination.  If the procedure report does not answer your questions, please call your gastroenterologist to clarify.  If you requested that your care partner not be given the details of your procedure findings, then the procedure report has been included in a sealed envelope for you to review at your convenience later.  YOU SHOULD EXPECT: Some feelings of bloating in the abdomen. Passage of more gas than usual.  Walking can help get rid of the air that was put into your GI tract during the procedure and reduce the bloating. If you had a lower endoscopy (such as a colonoscopy or flexible sigmoidoscopy) you may notice spotting of blood in your stool or on the toilet paper. If you underwent a bowel prep for your procedure, you may not have a normal bowel movement for a few days.  Please Note:  You might notice some irritation and congestion in your nose or some drainage.  This is from the oxygen used during your procedure.  There is no need for concern and it should clear up in a day or so.  SYMPTOMS TO REPORT IMMEDIATELY:    Following upper endoscopy (EGD)  Vomiting of blood or coffee ground material  New chest pain or pain under the shoulder blades  Painful or persistently difficult swallowing  New shortness of breath  Fever of 100F or higher  Black, tarry-looking stools  For urgent or emergent issues, a gastroenterologist can be reached at any hour by calling [phone]. Do not use MyChart messaging for urgent concerns.    DIET:  We do recommend a small meal at first, but then you may proceed to your regular diet.  Drink plenty of fluids but you should avoid alcoholic beverages for 24 hours.  ACTIVITY:  You should plan to take it easy for the rest of today and you should NOT  DRIVE or use heavy machinery until tomorrow (because of the sedation medicines used during the test).    FOLLOW UP: Our staff will call the number listed on your records 48-72 hours following your procedure to check on you and address any questions or concerns that you may have regarding the information given to you following your procedure. If we do not reach you, we will leave a message.  We will attempt to reach you two times.  During this call, we will ask if you have developed any symptoms of COVID 19. If you develop any symptoms (ie: fever, flu-like symptoms, shortness of breath, cough etc.) before then, please call [phone].  If you test positive for Covid 19 in the 2 weeks post procedure, please call and report this information to us.    If any biopsies were taken you will be contacted by phone or by letter within the next 1-3 weeks.  Please call us at [phone] if you have not heard about the biopsies in 3 weeks.    SIGNATURES/CONFIDENTIALITY: You and/or your care partner have signed paperwork which will be entered into your electronic medical record.  These signatures attest to the fact that that the information above on your After Visit Summary has been reviewed and is understood.  Full responsibility of the confidentiality of this discharge information lies with you and/or your care-partner. 

## 2020-02-04 NOTE — Progress Notes (Signed)
Called to room to assist during endoscopic procedure.  Patient ID and intended procedure confirmed with present staff. Received instructions for my participation in the procedure from the performing physician.  

## 2020-02-04 NOTE — Progress Notes (Signed)
Pt's states no medical or surgical changes since previsit or office visit.  CS - temp DT - vitals 

## 2020-02-08 ENCOUNTER — Telehealth: Payer: Self-pay

## 2020-02-08 ENCOUNTER — Telehealth: Payer: Self-pay | Admitting: *Deleted

## 2020-02-08 NOTE — Telephone Encounter (Signed)
Left message on 2nd follow up call. 

## 2020-02-08 NOTE — Telephone Encounter (Signed)
Message left

## 2020-02-14 ENCOUNTER — Encounter: Payer: Self-pay | Admitting: Internal Medicine

## 2020-02-16 ENCOUNTER — Other Ambulatory Visit: Payer: Self-pay

## 2020-02-17 ENCOUNTER — Encounter: Payer: Self-pay | Admitting: Obstetrics & Gynecology

## 2020-02-17 ENCOUNTER — Ambulatory Visit (INDEPENDENT_AMBULATORY_CARE_PROVIDER_SITE_OTHER): Payer: PPO | Admitting: Obstetrics & Gynecology

## 2020-02-17 VITALS — BP 120/72 | Ht 59.5 in | Wt 164.4 lb

## 2020-02-17 DIAGNOSIS — Z01419 Encounter for gynecological examination (general) (routine) without abnormal findings: Secondary | ICD-10-CM

## 2020-02-17 DIAGNOSIS — C50411 Malignant neoplasm of upper-outer quadrant of right female breast: Secondary | ICD-10-CM

## 2020-02-17 DIAGNOSIS — Z9289 Personal history of other medical treatment: Secondary | ICD-10-CM | POA: Diagnosis not present

## 2020-02-17 DIAGNOSIS — Z90710 Acquired absence of both cervix and uterus: Secondary | ICD-10-CM | POA: Diagnosis not present

## 2020-02-17 DIAGNOSIS — Z170 Estrogen receptor positive status [ER+]: Secondary | ICD-10-CM

## 2020-02-17 DIAGNOSIS — Z853 Personal history of malignant neoplasm of breast: Secondary | ICD-10-CM

## 2020-02-17 DIAGNOSIS — E6609 Other obesity due to excess calories: Secondary | ICD-10-CM

## 2020-02-17 DIAGNOSIS — Z780 Asymptomatic menopausal state: Secondary | ICD-10-CM

## 2020-02-17 DIAGNOSIS — M8589 Other specified disorders of bone density and structure, multiple sites: Secondary | ICD-10-CM

## 2020-02-17 DIAGNOSIS — Z6832 Body mass index (BMI) 32.0-32.9, adult: Secondary | ICD-10-CM

## 2020-02-17 NOTE — Progress Notes (Signed)
[name] 03-Dec-1940 [id number]   History:    79 y.o. G2P2L2 Married.  Lives at [facility] in a [place].  RP:  Established patient presenting for annual gyn exam   HPI:  S/P Hysterectomy.  Menopause, well on no HRT.  No pelvic pain.  Abstinent.  Breast Ca followed by Dr Jana Hakim, now d/c from his care: Right lumpectomy and sentinel lymph node sampling 09/04/2010.  Invasive lobular carcinoma, grade 2, measuring 3 cm. One out of 3 sentinel lymph nodes was involved by a 7 mm metastatic deposit.  Received chemotherapy, radiation therapy and tamoxifen.  Letrozole since June 2012, now stopped.  Osteopenia on recent BD with Dr [name].  On Prolia.  Very mild SUI, doing Kegels.  Bowel movements normal.  Body mass index 32.65.  Very active physically.  Health labs with family physician.   Past medical history,surgical history, family history and social history were all reviewed and documented in the EPIC chart.  Gynecologic History No LMP recorded. Patient has had a hysterectomy.  Obstetric History OB History  Gravida Para Term Preterm AB Living  2 2       2   SAB TAB Ectopic Multiple Live Births               # Outcome Date GA Lbr Len/2nd Weight Sex Delivery Anes PTL Lv  2 Para           1 Para              ROS: A ROS was performed and pertinent positives and negatives are included in the history.  GENERAL: No fevers or chills. HEENT: No change in vision, no earache, sore throat or sinus congestion. NECK: No pain or stiffness. CARDIOVASCULAR: No chest pain or pressure. No palpitations. PULMONARY: No shortness of breath, cough or wheeze. GASTROINTESTINAL: No abdominal pain, nausea, vomiting or diarrhea, melena or bright red blood per rectum. GENITOURINARY: No urinary frequency, urgency, hesitancy or dysuria. MUSCULOSKELETAL: No joint or muscle pain, no back pain, no recent trauma. DERMATOLOGIC: No rash, no itching, no lesions. ENDOCRINE: No polyuria, polydipsia, no heat or cold  intolerance. No recent change in weight. HEMATOLOGICAL: No anemia or easy bruising or bleeding. NEUROLOGIC: No headache, seizures, numbness, tingling or weakness. PSYCHIATRIC: No depression, no loss of interest in normal activity or change in sleep pattern.     Exam:   BP 120/72   Ht 4' 11.5" (1.511 m)   Wt 164 lb 6.4 oz (74.6 kg)   BMI 32.65 kg/m   Body mass index is 32.65 kg/m.  General appearance : Well developed well nourished female. No acute distress HEENT: Eyes: no retinal hemorrhage or exudates,  Neck supple, trachea midline, no carotid bruits, no thyroidmegaly Lungs: Clear to auscultation, no rhonchi or wheezes, or rib retractions  Heart: Regular rate and rhythm, no murmurs or gallops Breast:Examined in sitting and supine position were symmetrical in appearance, no palpable masses or tenderness,  no skin retraction, no nipple inversion, no nipple discharge, no skin discoloration, no axillary or supraclavicular lymphadenopathy Abdomen: no palpable masses or tenderness, no rebound or guarding Extremities: no edema or skin discoloration or tenderness  Pelvic: Vulva: Normal             Vagina: No gross lesions or discharge  Cervix/Uterus absent  Adnexa  Without masses or tenderness  Anus: Normal   Assessment/Plan:  79 y.o. female for annual exam   1. Well female exam with routine gynecological exam Gynecologic exam status  post total hysterectomy.  No indication to repeat a Pap test.  Breast exam normal.  Last screening mammogram June 2020 was negative.  Colonoscopy 2017.  Health labs with family physician.  2. S/P total hysterectomy  3. Menopause present Well on no hormone replacement therapy.  4. Osteopenia of multiple sites Last bone density showing osteopenia.  Responding well to Prolia treatment.  Followed by Dr. [name].  5. Malignant neoplasm of upper-outer quadrant of right breast in female, estrogen receptor positive ([place]) Discharged from Dr. Virgie Dad  care.  6. Class 1 obesity due to excess calories without serious comorbidity with body mass index (BMI) of 32.0 to 32.9 in adult Recommend a mildly lower calorie diet with low carbs.  Continue with fitness.  Princess Bruins MD, 2:28 PM 02/17/2020

## 2020-02-23 ENCOUNTER — Encounter: Payer: Self-pay | Admitting: Obstetrics & Gynecology

## 2020-02-23 NOTE — Patient Instructions (Signed)
1. Well female exam with routine gynecological exam Gynecologic exam status post total hysterectomy.  No indication to repeat a Pap test.  Breast exam normal.  Last screening mammogram June 2020 was negative.  Colonoscopy 2017.  Health labs with family physician.  2. S/P total hysterectomy  3. Menopause present Well on no hormone replacement therapy.  4. Osteopenia of multiple sites Last bone density showing osteopenia.  Responding well to Prolia treatment.  Followed by Dr. [name].  5. Malignant neoplasm of upper-outer quadrant of right breast in female, estrogen receptor positive ([place]) Discharged from Dr. Virgie Dad care.  6. Class 1 obesity due to excess calories without serious comorbidity with body mass index (BMI) of 32.0 to 32.9 in adult Recommend a mildly lower calorie diet with low carbs.  Continue with fitness.  [name], it was a pleasure seeing you today!

## 2020-03-22 DIAGNOSIS — E538 Deficiency of other specified B group vitamins: Secondary | ICD-10-CM | POA: Diagnosis not present

## 2020-03-22 DIAGNOSIS — I10 Essential (primary) hypertension: Secondary | ICD-10-CM | POA: Diagnosis not present

## 2020-03-22 DIAGNOSIS — M859 Disorder of bone density and structure, unspecified: Secondary | ICD-10-CM | POA: Diagnosis not present

## 2020-03-27 DIAGNOSIS — I10 Essential (primary) hypertension: Secondary | ICD-10-CM | POA: Diagnosis not present

## 2020-03-27 DIAGNOSIS — R82998 Other abnormal findings in urine: Secondary | ICD-10-CM | POA: Diagnosis not present

## 2020-03-27 DIAGNOSIS — Z1212 Encounter for screening for malignant neoplasm of rectum: Secondary | ICD-10-CM | POA: Diagnosis not present

## 2020-03-29 DIAGNOSIS — J45901 Unspecified asthma with (acute) exacerbation: Secondary | ICD-10-CM | POA: Diagnosis not present

## 2020-03-29 DIAGNOSIS — Z0000 Encounter for general adult medical examination without abnormal findings: Secondary | ICD-10-CM | POA: Diagnosis not present

## 2020-03-29 DIAGNOSIS — F329 Major depressive disorder, single episode, unspecified: Secondary | ICD-10-CM | POA: Diagnosis not present

## 2020-03-29 DIAGNOSIS — E538 Deficiency of other specified B group vitamins: Secondary | ICD-10-CM | POA: Diagnosis not present

## 2020-03-29 DIAGNOSIS — N390 Urinary tract infection, site not specified: Secondary | ICD-10-CM | POA: Diagnosis not present

## 2020-03-29 DIAGNOSIS — K219 Gastro-esophageal reflux disease without esophagitis: Secondary | ICD-10-CM | POA: Diagnosis not present

## 2020-03-29 DIAGNOSIS — M859 Disorder of bone density and structure, unspecified: Secondary | ICD-10-CM | POA: Diagnosis not present

## 2020-03-29 DIAGNOSIS — Z1331 Encounter for screening for depression: Secondary | ICD-10-CM | POA: Diagnosis not present

## 2020-03-31 DIAGNOSIS — F4320 Adjustment disorder, unspecified: Secondary | ICD-10-CM | POA: Diagnosis not present

## 2020-04-26 DIAGNOSIS — H524 Presbyopia: Secondary | ICD-10-CM | POA: Diagnosis not present

## 2020-04-26 DIAGNOSIS — Z961 Presence of intraocular lens: Secondary | ICD-10-CM | POA: Diagnosis not present

## 2020-04-26 DIAGNOSIS — H52203 Unspecified astigmatism, bilateral: Secondary | ICD-10-CM | POA: Diagnosis not present

## 2020-05-22 DIAGNOSIS — C50911 Malignant neoplasm of unspecified site of right female breast: Secondary | ICD-10-CM | POA: Diagnosis not present

## 2020-05-22 DIAGNOSIS — Z9889 Other specified postprocedural states: Secondary | ICD-10-CM | POA: Diagnosis not present

## 2020-05-22 DIAGNOSIS — Z08 Encounter for follow-up examination after completed treatment for malignant neoplasm: Secondary | ICD-10-CM | POA: Diagnosis not present

## 2020-05-22 DIAGNOSIS — Z923 Personal history of irradiation: Secondary | ICD-10-CM | POA: Diagnosis not present

## 2020-05-22 DIAGNOSIS — Z170 Estrogen receptor positive status [ER+]: Secondary | ICD-10-CM | POA: Diagnosis not present

## 2020-05-22 DIAGNOSIS — Z853 Personal history of malignant neoplasm of breast: Secondary | ICD-10-CM | POA: Diagnosis not present

## 2020-05-22 DIAGNOSIS — Z87891 Personal history of nicotine dependence: Secondary | ICD-10-CM | POA: Diagnosis not present

## 2020-05-22 DIAGNOSIS — Z9221 Personal history of antineoplastic chemotherapy: Secondary | ICD-10-CM | POA: Diagnosis not present

## 2020-08-14 DIAGNOSIS — Z23 Encounter for immunization: Secondary | ICD-10-CM | POA: Diagnosis not present

## 2020-09-18 DIAGNOSIS — D225 Melanocytic nevi of trunk: Secondary | ICD-10-CM | POA: Diagnosis not present

## 2020-09-18 DIAGNOSIS — L821 Other seborrheic keratosis: Secondary | ICD-10-CM | POA: Diagnosis not present

## 2020-09-18 DIAGNOSIS — D2272 Melanocytic nevi of left lower limb, including hip: Secondary | ICD-10-CM | POA: Diagnosis not present

## 2020-09-18 DIAGNOSIS — L603 Nail dystrophy: Secondary | ICD-10-CM | POA: Diagnosis not present

## 2020-11-14 DIAGNOSIS — L570 Actinic keratosis: Secondary | ICD-10-CM | POA: Diagnosis not present

## 2020-11-14 DIAGNOSIS — Z419 Encounter for procedure for purposes other than remedying health state, unspecified: Secondary | ICD-10-CM | POA: Diagnosis not present

## 2020-11-14 DIAGNOSIS — L821 Other seborrheic keratosis: Secondary | ICD-10-CM | POA: Diagnosis not present

## 2020-12-03 ENCOUNTER — Other Ambulatory Visit: Payer: Self-pay | Admitting: Internal Medicine

## 2021-02-04 DIAGNOSIS — J0140 Acute pansinusitis, unspecified: Secondary | ICD-10-CM | POA: Diagnosis not present

## 2021-02-09 DIAGNOSIS — R110 Nausea: Secondary | ICD-10-CM | POA: Diagnosis not present

## 2021-02-09 DIAGNOSIS — J0140 Acute pansinusitis, unspecified: Secondary | ICD-10-CM | POA: Diagnosis not present

## 2021-02-09 DIAGNOSIS — Z1152 Encounter for screening for COVID-19: Secondary | ICD-10-CM | POA: Diagnosis not present

## 2021-02-09 DIAGNOSIS — R059 Cough, unspecified: Secondary | ICD-10-CM | POA: Diagnosis not present

## 2021-02-09 DIAGNOSIS — K219 Gastro-esophageal reflux disease without esophagitis: Secondary | ICD-10-CM | POA: Diagnosis not present

## 2021-02-20 DIAGNOSIS — J069 Acute upper respiratory infection, unspecified: Secondary | ICD-10-CM | POA: Diagnosis not present

## 2021-02-20 DIAGNOSIS — J3089 Other allergic rhinitis: Secondary | ICD-10-CM | POA: Diagnosis not present

## 2021-02-20 DIAGNOSIS — E538 Deficiency of other specified B group vitamins: Secondary | ICD-10-CM | POA: Diagnosis not present

## 2021-02-20 DIAGNOSIS — J329 Chronic sinusitis, unspecified: Secondary | ICD-10-CM | POA: Diagnosis not present

## 2021-02-22 DIAGNOSIS — J3489 Other specified disorders of nose and nasal sinuses: Secondary | ICD-10-CM | POA: Diagnosis not present

## 2021-02-22 DIAGNOSIS — J328 Other chronic sinusitis: Secondary | ICD-10-CM | POA: Diagnosis not present

## 2021-03-01 DIAGNOSIS — D164 Benign neoplasm of bones of skull and face: Secondary | ICD-10-CM | POA: Diagnosis not present

## 2021-03-05 DIAGNOSIS — D72819 Decreased white blood cell count, unspecified: Secondary | ICD-10-CM | POA: Diagnosis not present

## 2021-03-05 DIAGNOSIS — C50919 Malignant neoplasm of unspecified site of unspecified female breast: Secondary | ICD-10-CM | POA: Diagnosis not present

## 2021-03-05 DIAGNOSIS — J321 Chronic frontal sinusitis: Secondary | ICD-10-CM | POA: Diagnosis not present

## 2021-03-05 DIAGNOSIS — F329 Major depressive disorder, single episode, unspecified: Secondary | ICD-10-CM | POA: Diagnosis not present

## 2021-03-05 DIAGNOSIS — I10 Essential (primary) hypertension: Secondary | ICD-10-CM | POA: Diagnosis not present

## 2021-03-05 DIAGNOSIS — R6889 Other general symptoms and signs: Secondary | ICD-10-CM | POA: Diagnosis not present

## 2021-03-05 DIAGNOSIS — E538 Deficiency of other specified B group vitamins: Secondary | ICD-10-CM | POA: Diagnosis not present

## 2021-04-18 DIAGNOSIS — E538 Deficiency of other specified B group vitamins: Secondary | ICD-10-CM | POA: Diagnosis not present

## 2021-04-18 DIAGNOSIS — I10 Essential (primary) hypertension: Secondary | ICD-10-CM | POA: Diagnosis not present

## 2021-04-18 DIAGNOSIS — Z0000 Encounter for general adult medical examination without abnormal findings: Secondary | ICD-10-CM | POA: Diagnosis not present

## 2021-04-18 DIAGNOSIS — E559 Vitamin D deficiency, unspecified: Secondary | ICD-10-CM | POA: Diagnosis not present

## 2021-04-25 DIAGNOSIS — M5136 Other intervertebral disc degeneration, lumbar region: Secondary | ICD-10-CM | POA: Diagnosis not present

## 2021-04-25 DIAGNOSIS — R000 Tachycardia, unspecified: Secondary | ICD-10-CM | POA: Diagnosis not present

## 2021-04-25 DIAGNOSIS — M8580 Other specified disorders of bone density and structure, unspecified site: Secondary | ICD-10-CM | POA: Diagnosis not present

## 2021-04-25 DIAGNOSIS — E785 Hyperlipidemia, unspecified: Secondary | ICD-10-CM | POA: Diagnosis not present

## 2021-04-25 DIAGNOSIS — M542 Cervicalgia: Secondary | ICD-10-CM | POA: Diagnosis not present

## 2021-04-25 DIAGNOSIS — Z1389 Encounter for screening for other disorder: Secondary | ICD-10-CM | POA: Diagnosis not present

## 2021-04-25 DIAGNOSIS — R82998 Other abnormal findings in urine: Secondary | ICD-10-CM | POA: Diagnosis not present

## 2021-04-25 DIAGNOSIS — E538 Deficiency of other specified B group vitamins: Secondary | ICD-10-CM | POA: Diagnosis not present

## 2021-04-25 DIAGNOSIS — I10 Essential (primary) hypertension: Secondary | ICD-10-CM | POA: Diagnosis not present

## 2021-04-25 DIAGNOSIS — J45901 Unspecified asthma with (acute) exacerbation: Secondary | ICD-10-CM | POA: Diagnosis not present

## 2021-04-25 DIAGNOSIS — Z1212 Encounter for screening for malignant neoplasm of rectum: Secondary | ICD-10-CM | POA: Diagnosis not present

## 2021-04-25 DIAGNOSIS — Z0000 Encounter for general adult medical examination without abnormal findings: Secondary | ICD-10-CM | POA: Diagnosis not present

## 2021-04-25 DIAGNOSIS — R0609 Other forms of dyspnea: Secondary | ICD-10-CM | POA: Diagnosis not present

## 2021-05-01 ENCOUNTER — Other Ambulatory Visit: Payer: Self-pay | Admitting: Internal Medicine

## 2021-05-01 DIAGNOSIS — E785 Hyperlipidemia, unspecified: Secondary | ICD-10-CM

## 2021-05-07 DIAGNOSIS — J3089 Other allergic rhinitis: Secondary | ICD-10-CM | POA: Diagnosis not present

## 2021-05-07 DIAGNOSIS — Z961 Presence of intraocular lens: Secondary | ICD-10-CM | POA: Diagnosis not present

## 2021-05-07 DIAGNOSIS — H524 Presbyopia: Secondary | ICD-10-CM | POA: Diagnosis not present

## 2021-05-07 DIAGNOSIS — C50919 Malignant neoplasm of unspecified site of unspecified female breast: Secondary | ICD-10-CM | POA: Diagnosis not present

## 2021-05-07 DIAGNOSIS — H52203 Unspecified astigmatism, bilateral: Secondary | ICD-10-CM | POA: Diagnosis not present

## 2021-05-07 DIAGNOSIS — R591 Generalized enlarged lymph nodes: Secondary | ICD-10-CM | POA: Diagnosis not present

## 2021-05-09 ENCOUNTER — Other Ambulatory Visit: Payer: Self-pay | Admitting: Internal Medicine

## 2021-05-09 DIAGNOSIS — R591 Generalized enlarged lymph nodes: Secondary | ICD-10-CM

## 2021-05-17 DIAGNOSIS — R151 Fecal smearing: Secondary | ICD-10-CM | POA: Diagnosis not present

## 2021-05-17 DIAGNOSIS — M6289 Other specified disorders of muscle: Secondary | ICD-10-CM | POA: Diagnosis not present

## 2021-05-17 DIAGNOSIS — M6281 Muscle weakness (generalized): Secondary | ICD-10-CM | POA: Diagnosis not present

## 2021-05-17 DIAGNOSIS — N3941 Urge incontinence: Secondary | ICD-10-CM | POA: Diagnosis not present

## 2021-05-21 ENCOUNTER — Ambulatory Visit
Admission: RE | Admit: 2021-05-21 | Discharge: 2021-05-21 | Disposition: A | Discharge status: Home / Self Care | Payer: PPO | Source: Ambulatory Visit | Attending: Internal Medicine | Admitting: Internal Medicine

## 2021-05-21 ENCOUNTER — Encounter: Payer: Self-pay | Admitting: Oncology

## 2021-05-21 DIAGNOSIS — M6281 Muscle weakness (generalized): Secondary | ICD-10-CM | POA: Diagnosis not present

## 2021-05-21 DIAGNOSIS — R591 Generalized enlarged lymph nodes: Secondary | ICD-10-CM

## 2021-05-21 DIAGNOSIS — M6289 Other specified disorders of muscle: Secondary | ICD-10-CM | POA: Diagnosis not present

## 2021-05-21 DIAGNOSIS — R151 Fecal smearing: Secondary | ICD-10-CM | POA: Diagnosis not present

## 2021-05-21 DIAGNOSIS — N3941 Urge incontinence: Secondary | ICD-10-CM | POA: Diagnosis not present

## 2021-05-21 DIAGNOSIS — E785 Hyperlipidemia, unspecified: Secondary | ICD-10-CM

## 2021-05-21 MED ORDER — IOPAMIDOL (ISOVUE-300) INJECTION 61%
75.0000 mL | Freq: Once | INTRAVENOUS | Status: AC | PRN
  Administered 2021-05-21: 75 mL via INTRAVENOUS

## 2021-05-23 ENCOUNTER — Encounter: Payer: Self-pay | Admitting: Oncology

## 2021-05-24 ENCOUNTER — Encounter: Payer: Self-pay | Admitting: Obstetrics & Gynecology

## 2021-05-24 ENCOUNTER — Other Ambulatory Visit: Payer: Self-pay

## 2021-05-24 ENCOUNTER — Ambulatory Visit (INDEPENDENT_AMBULATORY_CARE_PROVIDER_SITE_OTHER): Payer: PPO | Admitting: Obstetrics & Gynecology

## 2021-05-24 VITALS — BP 114/74 | HR 80 | Resp 16 | Ht 59.25 in | Wt 156.0 lb

## 2021-05-24 DIAGNOSIS — N393 Stress incontinence (female) (male): Secondary | ICD-10-CM | POA: Diagnosis not present

## 2021-05-24 DIAGNOSIS — Z170 Estrogen receptor positive status [ER+]: Secondary | ICD-10-CM

## 2021-05-24 DIAGNOSIS — Z853 Personal history of malignant neoplasm of breast: Secondary | ICD-10-CM | POA: Diagnosis not present

## 2021-05-24 DIAGNOSIS — M8589 Other specified disorders of bone density and structure, multiple sites: Secondary | ICD-10-CM | POA: Diagnosis not present

## 2021-05-24 DIAGNOSIS — R151 Fecal smearing: Secondary | ICD-10-CM | POA: Diagnosis not present

## 2021-05-24 DIAGNOSIS — Z01411 Encounter for gynecological examination (general) (routine) with abnormal findings: Secondary | ICD-10-CM

## 2021-05-24 DIAGNOSIS — Z780 Asymptomatic menopausal state: Secondary | ICD-10-CM | POA: Diagnosis not present

## 2021-05-24 DIAGNOSIS — R1900 Intra-abdominal and pelvic swelling, mass and lump, unspecified site: Secondary | ICD-10-CM | POA: Diagnosis not present

## 2021-05-24 DIAGNOSIS — C50411 Malignant neoplasm of upper-outer quadrant of right female breast: Secondary | ICD-10-CM

## 2021-05-24 DIAGNOSIS — Z90710 Acquired absence of both cervix and uterus: Secondary | ICD-10-CM | POA: Diagnosis not present

## 2021-05-24 DIAGNOSIS — Z01419 Encounter for gynecological examination (general) (routine) without abnormal findings: Secondary | ICD-10-CM

## 2021-05-24 NOTE — Progress Notes (Signed)
[name] [birth year] [id number]   History:    80 y.o. G2P2L2 Married.  Lives at [facility] in a [place].   RP:  Established patient presenting for annual gyn exam   HPI:  S/P Hysterectomy.  Postmenopause, well on no HRT.  No pelvic pain.  Abstinent.  Breast Ca, graduated from Dr Magrinat's care: Right lumpectomy and sentinel lymph node sampling 09/04/2010.  Invasive lobular carcinoma, grade 2, measuring 3 cm. One out of 3 sentinel lymph nodes was involved by a 7 mm metastatic deposit.  Received chemotherapy, radiation therapy and tamoxifen.  Letrozole since June 2012, now stopped.  Osteopenia on recent BD with Dr [name].  Stopped Prolia.  SUI and mild stool incontinence.  Doing Pelvic floor PT.  Body mass index 31.24.  Very active physically.  Health labs with family physician.  Past medical history,surgical history, family history and social history were all reviewed and documented in the EPIC chart.  Gynecologic History No LMP recorded. Patient has had a hysterectomy.  Obstetric History OB History  Gravida Para Term Preterm AB Living  2 2       2   SAB IAB Ectopic Multiple Live Births               # Outcome Date GA Lbr Len/2nd Weight Sex Delivery Anes PTL Lv  2 Para           1 Para              ROS: A ROS was performed and pertinent positives and negatives are included in the history.  GENERAL: No fevers or chills. HEENT: No change in vision, no earache, sore throat or sinus congestion. NECK: No pain or stiffness. CARDIOVASCULAR: No chest pain or pressure. No palpitations. PULMONARY: No shortness of breath, cough or wheeze. GASTROINTESTINAL: No abdominal pain, nausea, vomiting or diarrhea, melena or bright red blood per rectum. GENITOURINARY: No urinary frequency, urgency, hesitancy or dysuria. MUSCULOSKELETAL: No joint or muscle pain, no back pain, no recent trauma. DERMATOLOGIC: No rash, no itching, no lesions. ENDOCRINE: No polyuria, polydipsia, no heat or cold  intolerance. No recent change in weight. HEMATOLOGICAL: No anemia or easy bruising or bleeding. NEUROLOGIC: No headache, seizures, numbness, tingling or weakness. PSYCHIATRIC: No depression, no loss of interest in normal activity or change in sleep pattern.     Exam:   BP 114/74   Pulse 80   Resp 16   Ht 4' 11.25" (1.505 m)   Wt 156 lb (70.8 kg)   BMI 31.24 kg/m   Body mass index is 31.24 kg/m.  General appearance : Well developed well nourished female. No acute distress HEENT: Eyes: no retinal hemorrhage or exudates,  Neck supple, trachea midline, no carotid bruits, no thyroidmegaly Lungs: Clear to auscultation, no rhonchi or wheezes, or rib retractions  Heart: Regular rate and rhythm, no murmurs or gallops Breast:Examined in sitting and supine position were symmetrical in appearance, no palpable masses or tenderness,  no skin retraction, no nipple inversion, no nipple discharge, no skin discoloration, no axillary or supraclavicular lymphadenopathy Abdomen: no palpable masses or tenderness, no rebound or guarding Extremities: no edema or skin discoloration or tenderness  Pelvic: Vulva: Normal             Vagina: No gross lesions or discharge.  No Cysto-rectocele, no colpocele.  Pelvic mass felt at the vaginal cuff, bowel loop with stool?  Cervix/Uterus absent  Adnexa  Without masses or tenderness  Anus: normal.  Anal sphincter:  Active contraction of mild-moderate strength   Assessment/Plan:  80 y.o. female for annual exam   1. Well female exam with routine gynecological exam Gynecologic exam in menopause status post total hysterectomy.  No indication for Pap test at this time.  Breast exam normal.  We will continue with screening mammograms annually, last mammogram negative in July 2021.  Colonoscopy in 2017.  Body mass index 31.24.  Continue with fitness and healthy nutrition.  Health labs with Dr. [name].  2. S/P total hysterectomy  3. Postmenopause Well on no hormone  replacement therapy.  4. Osteopenia of multiple sites Osteopenia on last bone density in 2021.  Stopped Prolia.  We will continue to have bone densities every 2 years through Dr. [name].  Continue with vitamin D supplements and calcium 1.5 g/day total.  Continue weightbearing physical activities.  5. Pelvic mass in female Mass felt at the vaginal cuff in the pelvis.  Possibly in a deer and bowel loops with fecal matter.  Will rule out a pelvic mass with a pelvic ultrasound at next visit. - US Transvaginal Non-OB; Future  6. SUI (stress urinary incontinence, female) Will continue with pelvic floor physical therapy.  7. Fecal smearing Recommend modifying nutrition to obtain firmer stool.  Able to contract her sphincter muscle with mild to moderate constriction felt on exam.  We will continue with pelvic floor physical therapy.  8. Malignant neoplasm of upper-outer quadrant of right breast in female, estrogen receptor positive ([place]) Breast exam normal.  Continue annual screening mammograms.  Other orders - sertraline (ZOLOFT) 50 MG tablet; Take 50 mg by mouth daily. - montelukast (SINGULAIR) 10 MG tablet; Take 10 mg by mouth daily.   Princess Bruins MD, 1:42 PM 05/24/2021

## 2021-05-29 DIAGNOSIS — M6281 Muscle weakness (generalized): Secondary | ICD-10-CM | POA: Diagnosis not present

## 2021-05-29 DIAGNOSIS — R151 Fecal smearing: Secondary | ICD-10-CM | POA: Diagnosis not present

## 2021-05-29 DIAGNOSIS — M6289 Other specified disorders of muscle: Secondary | ICD-10-CM | POA: Diagnosis not present

## 2021-05-29 DIAGNOSIS — N3941 Urge incontinence: Secondary | ICD-10-CM | POA: Diagnosis not present

## 2021-06-05 ENCOUNTER — Encounter: Payer: PPO | Admitting: Obstetrics & Gynecology

## 2021-06-11 DIAGNOSIS — Z9221 Personal history of antineoplastic chemotherapy: Secondary | ICD-10-CM | POA: Diagnosis not present

## 2021-06-11 DIAGNOSIS — Z08 Encounter for follow-up examination after completed treatment for malignant neoplasm: Secondary | ICD-10-CM | POA: Diagnosis not present

## 2021-06-11 DIAGNOSIS — C50411 Malignant neoplasm of upper-outer quadrant of right female breast: Secondary | ICD-10-CM | POA: Diagnosis not present

## 2021-06-11 DIAGNOSIS — Z170 Estrogen receptor positive status [ER+]: Secondary | ICD-10-CM | POA: Diagnosis not present

## 2021-06-11 DIAGNOSIS — Z853 Personal history of malignant neoplasm of breast: Secondary | ICD-10-CM | POA: Diagnosis not present

## 2021-06-11 DIAGNOSIS — Z923 Personal history of irradiation: Secondary | ICD-10-CM | POA: Diagnosis not present

## 2021-06-11 DIAGNOSIS — Z87891 Personal history of nicotine dependence: Secondary | ICD-10-CM | POA: Diagnosis not present

## 2021-06-12 ENCOUNTER — Ambulatory Visit: Payer: PPO | Admitting: Obstetrics & Gynecology

## 2021-06-12 ENCOUNTER — Encounter: Payer: Self-pay | Admitting: Obstetrics & Gynecology

## 2021-06-12 ENCOUNTER — Other Ambulatory Visit: Payer: PPO

## 2021-06-12 ENCOUNTER — Other Ambulatory Visit: Payer: Self-pay

## 2021-06-12 ENCOUNTER — Ambulatory Visit (INDEPENDENT_AMBULATORY_CARE_PROVIDER_SITE_OTHER): Payer: PPO

## 2021-06-12 VITALS — BP 118/54 | HR 78 | Resp 18

## 2021-06-12 DIAGNOSIS — R351 Nocturia: Secondary | ICD-10-CM | POA: Diagnosis not present

## 2021-06-12 DIAGNOSIS — Z90710 Acquired absence of both cervix and uterus: Secondary | ICD-10-CM

## 2021-06-12 DIAGNOSIS — R1900 Intra-abdominal and pelvic swelling, mass and lump, unspecified site: Secondary | ICD-10-CM

## 2021-06-12 DIAGNOSIS — M6289 Other specified disorders of muscle: Secondary | ICD-10-CM | POA: Diagnosis not present

## 2021-06-12 DIAGNOSIS — M6281 Muscle weakness (generalized): Secondary | ICD-10-CM | POA: Diagnosis not present

## 2021-06-12 DIAGNOSIS — M62838 Other muscle spasm: Secondary | ICD-10-CM | POA: Diagnosis not present

## 2021-06-12 DIAGNOSIS — R151 Fecal smearing: Secondary | ICD-10-CM | POA: Diagnosis not present

## 2021-06-12 DIAGNOSIS — N3941 Urge incontinence: Secondary | ICD-10-CM | POA: Diagnosis not present

## 2021-06-12 NOTE — Progress Notes (Signed)
    [name] 10-Nov-1941 [id number]        80 y.o.  G2P2   RP: Pelvic mass at vaginal cuff felt on Gyn exam for Pelvic US  HPI: Pelvic mass at vaginal cuff felt on Gyn exam on 05/24/2021.  No pelvic pain.  S/P Total Hysterectomy/BSO.   OB History  Gravida Para Term Preterm AB Living  '2 2       2  '$ SAB IAB Ectopic Multiple Live Births               # Outcome Date GA Lbr Len/2nd Weight Sex Delivery Anes PTL Lv  2 Para           1 Para             Past medical history,surgical history, problem list, medications, allergies, family history and social history were all reviewed and documented in the EPIC chart.   Directed ROS with pertinent positives and negatives documented in the history of present illness/assessment and plan.  Exam:  Vitals:   06/12/21 1342  BP: (!) 118/54  Pulse: 78  Resp: 18   General appearance:  Normal  Pelvic US today: T/V images.  No latex allergy.  Cervix and uterus are surgically absent.  Both ovaries are surgically absent.  Vaginal cuff is within normal limits with no mass seen.  No adnexal mass or pelvic mass.  No free fluid in the pelvis.   Assessment/Plan:  80 y.o. G2P2   1. Pelvic mass in female Pelvic ultrasound findings thoroughly reviewed with patient.  Patient reassured that the vaginal cuff is within normal limits with no pelvic mass present and no free fluid in the pelvis.  Ultrasound pictures reviewed with patient.  Mass felt on last Gyn exam was likely a loop of bowel with hard fecal matter.  We will follow-up at next annual gynecologic exam.  2. S/P total hysterectomy with BSO   Princess Bruins MD, 2:11 PM 06/12/2021

## 2021-06-19 ENCOUNTER — Telehealth: Payer: Self-pay

## 2021-06-19 NOTE — Telephone Encounter (Signed)
Patient well-known to me I spoke to her by phone, she is feeling well.  Remains healthy and is not having issues with chest pain, shortness of breath, dyspnea on exertion. No major changes to her medical history She is seeing pelvic floor physical therapy to help work on pelvic floor dysfunction as well as some fecal smearing I suggested she try Metamucil, Benefiber or Citrucel 1 heaping tablespoon daily to help with fecal smearing She wishes to proceed with surveillance colonoscopy which I feel is appropriate  Okay for direct colonoscopy without office visit

## 2021-06-19 NOTE — Telephone Encounter (Signed)
Dr. Hilarie Fredrickson, This patient is currently scheduled for a PV appt on 06/27/21 and a colon on 07/18/2021.  She is due for a recall however, there is no recall sheet.  Please advise on recall status due to patient's age being greater than 29. Thank you!

## 2021-06-20 NOTE — Telephone Encounter (Signed)
Noted on PV chart ?

## 2021-06-27 ENCOUNTER — Encounter: Payer: Self-pay | Admitting: Oncology

## 2021-06-27 ENCOUNTER — Ambulatory Visit (AMBULATORY_SURGERY_CENTER): Payer: Self-pay

## 2021-06-27 ENCOUNTER — Other Ambulatory Visit: Payer: Self-pay

## 2021-06-27 VITALS — Ht 59.0 in | Wt 155.0 lb

## 2021-06-27 DIAGNOSIS — Z86010 Personal history of colonic polyps: Secondary | ICD-10-CM

## 2021-06-27 MED ORDER — PLENVU 140 G PO SOLR: 1.0000 | ORAL | 0 refills | Status: DC

## 2021-06-27 NOTE — Progress Notes (Signed)
Patient is here in-person for PV. Patient denies any allergies to eggs or soy. Patient denies any problems with anesthesia/sedation. Patient denies any oxygen use at home. Patient denies taking any diet/weight loss medications or blood thinners. Patient is aware of our care-partner policy and 0000000 safety protocol.   EMMI education assigned to the patient for the procedure, sent to [facility].   Patient is COVID-19 vaccinated.  Plenvu Prep Prescription coupon was given to the patient.

## 2021-07-10 ENCOUNTER — Telehealth: Payer: Self-pay | Admitting: Internal Medicine

## 2021-07-10 NOTE — Telephone Encounter (Signed)
Spoke with the patient. She will call her pharmacy again and use the Plenvu medicare coupon. I offered to change prep to [facility]- pt declined at this time. She will call us back if needed.

## 2021-07-13 DIAGNOSIS — M62838 Other muscle spasm: Secondary | ICD-10-CM | POA: Diagnosis not present

## 2021-07-13 DIAGNOSIS — M6289 Other specified disorders of muscle: Secondary | ICD-10-CM | POA: Diagnosis not present

## 2021-07-13 DIAGNOSIS — N3941 Urge incontinence: Secondary | ICD-10-CM | POA: Diagnosis not present

## 2021-07-13 DIAGNOSIS — R151 Fecal smearing: Secondary | ICD-10-CM | POA: Diagnosis not present

## 2021-07-13 DIAGNOSIS — R351 Nocturia: Secondary | ICD-10-CM | POA: Diagnosis not present

## 2021-07-13 DIAGNOSIS — M6281 Muscle weakness (generalized): Secondary | ICD-10-CM | POA: Diagnosis not present

## 2021-07-18 ENCOUNTER — Telehealth: Payer: Self-pay | Admitting: Internal Medicine

## 2021-07-18 ENCOUNTER — Ambulatory Visit (AMBULATORY_SURGERY_CENTER): Payer: PPO | Admitting: Internal Medicine

## 2021-07-18 ENCOUNTER — Other Ambulatory Visit: Payer: Self-pay

## 2021-07-18 ENCOUNTER — Encounter: Payer: Self-pay | Admitting: Internal Medicine

## 2021-07-18 VITALS — BP 131/64 | HR 76 | Resp 18 | Ht 59.0 in | Wt 155.0 lb

## 2021-07-18 DIAGNOSIS — K5289 Other specified noninfective gastroenteritis and colitis: Secondary | ICD-10-CM

## 2021-07-18 DIAGNOSIS — K529 Noninfective gastroenteritis and colitis, unspecified: Secondary | ICD-10-CM | POA: Diagnosis not present

## 2021-07-18 DIAGNOSIS — K639 Disease of intestine, unspecified: Secondary | ICD-10-CM

## 2021-07-18 DIAGNOSIS — D122 Benign neoplasm of ascending colon: Secondary | ICD-10-CM

## 2021-07-18 DIAGNOSIS — Z86010 Personal history of colonic polyps: Secondary | ICD-10-CM

## 2021-07-18 MED ORDER — SODIUM CHLORIDE 0.9 % IV SOLN: 500.0000 mL | Freq: Once | INTRAVENOUS | Status: DC

## 2021-07-18 NOTE — Telephone Encounter (Signed)
Pt called stating that she had thrown up 15 minutes after taking her second prep. She reports clear to yellow results. Encouraged her to drink plenty of fluids up until 10-1214. Pt verbalized understanding.

## 2021-07-18 NOTE — Op Note (Signed)
[facility] Patient Name: [name] Procedure Date: 07/18/2021 2:22 PM MRN: [id number] Endoscopist: Jerene Bears , MD Age: 80 Referring MD:  Date of Birth: [birth year] Gender: Female Account #: [id number] Procedure:                Colonoscopy Indications:              Surveillance: Personal history of adenomatous polyp                            and sessile serrated polyp on last colonoscopy 5                            years ago, Last colonoscopy: July 2017 Medicines:                Monitored Anesthesia Care Procedure:                Pre-Anesthesia Assessment:                           - Prior to the procedure, a History and Physical                            was performed, and patient medications and                            allergies were reviewed. The patient's tolerance of                            previous anesthesia was also reviewed. The risks                            and benefits of the procedure and the sedation                            options and risks were discussed with the patient.                            All questions were answered, and informed consent                            was obtained. Prior Anticoagulants: The patient has                            taken no previous anticoagulant or antiplatelet                            agents. ASA Grade Assessment: III - A patient with                            severe systemic disease. After reviewing the risks                            and benefits, the patient was deemed in  satisfactory condition to undergo the procedure.                           After obtaining informed consent, the colonoscope                            was passed under direct vision. Throughout the                            procedure, the patient's blood pressure, pulse, and                            oxygen saturations were monitored continuously. The                            PCF-HQ190L  Colonoscope was introduced through the                            anus and advanced to the cecum, identified by                            appendiceal orifice and ileocecal valve. The                            colonoscopy was performed without difficulty. The                            patient tolerated the procedure well. The quality                            of the bowel preparation was good. The ileocecal                            valve, appendiceal orifice, and rectum were                            photographed. Scope In: 2:49:00 PM Scope Out: 3:12:52 PM Scope Withdrawal Time: 0 hours 14 minutes 28 seconds  Total Procedure Duration: 0 hours 23 minutes 52 seconds  Findings:                 The digital rectal exam was normal.                           Three sessile polyps were found in the ascending                            colon. The polyps were 3 to 6 mm in size. These                            polyps were removed with a cold snare. Resection                            and retrieval were complete.  Multiple small and large-mouthed diverticula were                            found in the sigmoid colon. Erythema was seen in                            association with the diverticular opening. Biopsies                            were taken with a cold forceps for histology and to                            exclude SCAD.                           Internal hemorrhoids were found during                            retroflexion. The hemorrhoids were small.                           The exam was otherwise without abnormality. Complications:            No immediate complications. Estimated Blood Loss:     Estimated blood loss was minimal. Impression:               - Three 3 to 6 mm polyps in the ascending colon,                            removed with a cold snare. Resected and retrieved.                           - Severe diverticulosis in the sigmoid colon.                             Erythema was seen in association with the                            diverticular opening. Biopsied to exclude segmental                            colitis associated with diverticulosis.                           - Small internal hemorrhoids.                           - The examination was otherwise normal. Recommendation:           - Patient has a contact number available for                            emergencies. The signs and symptoms of potential                            delayed complications  were discussed with the                            patient. Return to normal activities tomorrow.                            Written discharge instructions were provided to the                            patient.                           - Resume previous diet.                           - Continue present medications.                           - Await pathology results.                           - No recommendation at this time regarding repeat                            colonoscopy due to age. Jerene Bears, MD 07/18/2021 3:19:23 PM This report has been signed electronically.

## 2021-07-18 NOTE — Progress Notes (Signed)
Pt's states no medical or surgical changes since previsit or office visit. VS assessed by D.T 

## 2021-07-18 NOTE — Progress Notes (Signed)
GASTROENTEROLOGY PROCEDURE H&P NOTE   Primary Care Physician: Crist Infante, MD    Reason for Procedure:  History of adenomatous and sessile serrated colon polyp  Plan:    Colonoscopy  Patient is appropriate for endoscopic procedure(s) in the ambulatory ([place]) setting.  The nature of the procedure, as well as the risks, benefits, and alternatives were carefully and thoroughly reviewed with the patient. Ample time for discussion and questions allowed. The patient understood, was satisfied, and agreed to proceed.     HPI: [name] is a 80 y.o. female who presents for surveillance colonoscopy.  Medical history as below.  No complaints today including recent chest pain, shortness of breath or abdominal pain.  She tolerated the prep.  Past Medical History:  Diagnosis Date   Allergic rhinitis    Allergy    Anemia    Anxiety    Asthma    Blood transfusion without reported diagnosis    Breast cancer ([place]) 2011   right side   Cataract    bil removed   Concussion    Depression    Diverticulitis    Diverticulosis    Endometriosis    GERD (gastroesophageal reflux disease)    Hypertension    Neuromuscular disorder ([place])    ociptal neuralgia   Osteopenia    Tubular adenoma of colon     Past Surgical History:  Procedure Laterality Date   APPENDECTOMY  1988   BACK SURGERY  09/1999   BREAST LUMPECTOMY Right 09/04/2010   w/ Sentinel Node Biopsy   BREAST LUMPECTOMY  [id number]   Fibroadenoma. Left   carpel tunnel Right last five years (04/19/15)   CATARACT EXTRACTION W/ INTRAOCULAR LENS  IMPLANT, BILATERAL Bilateral 05/15/16 and 05/09/16   COLONOSCOPY     LIPOMA EXCISION  08/2002   L shoulder    NASAL SINUS SURGERY  1994   OCCIPITAL NEURECTOMY     Nerve block   POLYPECTOMY     pvd Right    rectal fissure repair  11/1955   TOTAL ABDOMINAL HYSTERECTOMY  1988   TRIGGER FINGER RELEASE     x3   UPPER GASTROINTESTINAL ENDOSCOPY      Prior to Admission medications    Medication Sig Start Date End Date Taking? Authorizing Provider  buPROPion (WELLBUTRIN XL) 300 MG 24 hr tablet Take 300 mg by mouth daily.   Yes [provider]  CALCIUM & MAGNESIUM CARBONATES PO Take 1 tablet by mouth daily.   Yes [provider]  [employer] SALINE NOSE SPRAY NA Place 1 spray into the nose 2 (two) times daily as needed. Nasal congestion.   Yes [provider]  CYANOCOBALAMIN PO Take 1 tablet by mouth daily.   Yes [provider]  fluticasone (FLONASE) 50 MCG/ACT nasal spray Place 2 sprays into the nose as needed.     Yes [provider]  nebivolol (BYSTOLIC) 5 MG tablet Take 5 mg by mouth daily.     Yes [provider]  sertraline (ZOLOFT) 50 MG tablet Take 50 mg by mouth daily. 05/14/21  Yes [provider]  Vitamin D, Ergocalciferol, (DRISDOL) 1.25 MG (50000 UNIT) CAPS capsule Take 1 capsule by mouth once a week.   Yes [provider]  albuterol (VENTOLIN HFA) 108 (90 Base) MCG/ACT inhaler Use 2 puffs 4x a day prn Patient not taking: No sig reported 05/10/20   [provider]  CYANOCOBALAMIN IJ Inject 1 vial as directed See admin instructions. Every three months. Patient  had shot on 5/8.    [provider]  hyoscyamine (LEVSIN) 0.125 MG tablet See admin instructions. Patient not taking: Reported on 07/18/2021 05/28/19   [provider]  levocetirizine (XYZAL) 5 MG tablet 1 tablet in the evening Patient not taking: Reported on 07/18/2021 05/08/21   [provider]  montelukast (SINGULAIR) 10 MG tablet Take 10 mg by mouth daily. Patient not taking: Reported on 07/18/2021 02/20/21   [provider]  mupirocin ointment (BACTROBAN) 2 % APPLY A THIN LAYER  TO AFFECTED AREA(S) TWO TIMES A DAY UNTIL HEALED Patient not taking: Reported on 07/18/2021    [provider]  ondansetron (ZOFRAN-ODT) 8 MG disintegrating tablet Take 1 tablet (8 mg total) by mouth every 8 (eight) hours as  needed for nausea or vomiting. Patient not taking: Reported on 07/18/2021 07/09/16   Jerene Bears, MD  polyethylene glycol [facility] / Floria Raveling) packet Take 17 g by mouth every 3 (three) days.  Patient not taking: Reported on 07/18/2021    [provider]    Current Outpatient Medications  Medication Sig Dispense Refill   buPROPion (WELLBUTRIN XL) 300 MG 24 hr tablet Take 300 mg by mouth daily.     CALCIUM & MAGNESIUM CARBONATES PO Take 1 tablet by mouth daily.     [facility]S SALINE NOSE SPRAY NA Place 1 spray into the nose 2 (two) times daily as needed. Nasal congestion.     CYANOCOBALAMIN PO Take 1 tablet by mouth daily.     fluticasone (FLONASE) 50 MCG/ACT nasal spray Place 2 sprays into the nose as needed.       nebivolol (BYSTOLIC) 5 MG tablet Take 5 mg by mouth daily.       sertraline (ZOLOFT) 50 MG tablet Take 50 mg by mouth daily.     Vitamin D, Ergocalciferol, (DRISDOL) 1.25 MG (50000 UNIT) CAPS capsule Take 1 capsule by mouth once a week.     albuterol (VENTOLIN HFA) 108 (90 Base) MCG/ACT inhaler Use 2 puffs 4x a day prn (Patient not taking: No sig reported)     CYANOCOBALAMIN IJ Inject 1 vial as directed See admin instructions. Every three months. Patient had shot on 5/8.     hyoscyamine (LEVSIN) 0.125 MG tablet See admin instructions. (Patient not taking: Reported on 07/18/2021)     levocetirizine (XYZAL) 5 MG tablet 1 tablet in the evening (Patient not taking: Reported on 07/18/2021)     montelukast (SINGULAIR) 10 MG tablet Take 10 mg by mouth daily. (Patient not taking: Reported on 07/18/2021)     mupirocin ointment (BACTROBAN) 2 % APPLY A THIN LAYER  TO AFFECTED AREA(S) TWO TIMES A DAY UNTIL HEALED (Patient not taking: Reported on 07/18/2021)     ondansetron (ZOFRAN-ODT) 8 MG disintegrating tablet Take 1 tablet (8 mg total) by mouth every 8 (eight) hours as needed for nausea or vomiting. (Patient not taking: Reported on 07/18/2021) 20 tablet 1   polyethylene glycol (MIRALAX / GLYCOLAX)  packet Take 17 g by mouth every 3 (three) days.  (Patient not taking: Reported on 07/18/2021)     Current Facility-Administered Medications  Medication Dose Route Frequency Provider Last Rate Last Admin   0.9 %  sodium chloride infusion  500 mL Intravenous Once Sallyanne Birkhead, Lajuan Lines, MD        Allergies as of 07/18/2021 - Review Complete 07/18/2021  Allergen Reaction Noted   Sulfamethoxazole-trimethoprim Nausea Only 11/30/2017    Family History  Problem Relation Age of Onset   Heart disease  Mother    Heart disease Father    Lung cancer Father        smoker   Heart attack Father    Melanoma Daughter    Colon cancer Neg Hx    Colon polyps Neg Hx    Pancreatic cancer Neg Hx    Esophageal cancer Neg Hx    Rectal cancer Neg Hx    Stomach cancer Neg Hx     Social History   Socioeconomic History   Marital status: Married    Spouse name: Not on file   Number of children: 2   Years of education: Not on file   Highest education level: Not on file  Occupational History   Occupation: fundraising consultant-retired  Tobacco Use   Smoking status: Former    Packs/day: 1.00    Years: 10.00    Pack years: 10.00    Types: Cigarettes    Quit date: 11/11/1978    Years since quitting: 42.7   Smokeless tobacco: Never  Vaping Use   Vaping Use: Never used  Substance and Sexual Activity   Alcohol use: Yes    Comment: social   Drug use: No   Sexual activity: Yes    Partners: Male    Birth control/protection: Surgical    Comment: hysterectomy  Other Topics Concern   Not on file  Social History Narrative   Not on file   Social Determinants of Health   Financial Resource Strain: Not on file  Food Insecurity: Not on file  Transportation Needs: Not on file  Physical Activity: Not on file  Stress: Not on file  Social Connections: Not on file  Intimate Partner Violence: Not on file    Physical Exam: Vital signs in last 24 hours: '@BP'$  97/61   Pulse 84   Ht '4\' 11"'$  (1.499 m)   Wt 155 lb  (70.3 kg)   SpO2 97%   BMI 31.31 kg/m  GEN: NAD EYE: Sclerae anicteric ENT: MMM CV: Non-tachycardic Pulm: CTA b/l GI: Soft, NT/ND NEURO:  Alert & Oriented x 3   Zenovia Jarred, MD [facility]  07/18/2021 2:31 PM

## 2021-07-18 NOTE — Patient Instructions (Signed)
Handout on polyps and diverticulosis given    YOU HAD AN ENDOSCOPIC PROCEDURE TODAY AT THE [facility]:   Refer to the procedure report that was given to you for any specific questions about what was found during the examination.  If the procedure report does not answer your questions, please call your gastroenterologist to clarify.  If you requested that your care partner not be given the details of your procedure findings, then the procedure report has been included in a sealed envelope for you to review at your convenience later.  YOU SHOULD EXPECT: Some feelings of bloating in the abdomen. Passage of more gas than usual.  Walking can help get rid of the air that was put into your GI tract during the procedure and reduce the bloating. If you had a lower endoscopy (such as a colonoscopy or flexible sigmoidoscopy) you may notice spotting of blood in your stool or on the toilet paper. If you underwent a bowel prep for your procedure, you may not have a normal bowel movement for a few days.  Please Note:  You might notice some irritation and congestion in your nose or some drainage.  This is from the oxygen used during your procedure.  There is no need for concern and it should clear up in a day or so.  SYMPTOMS TO REPORT IMMEDIATELY:   Following lower endoscopy (colonoscopy or flexible sigmoidoscopy):  Excessive amounts of blood in the stool  Significant tenderness or worsening of abdominal pains  Swelling of the abdomen that is new, acute  Fever of 100F or higher   For urgent or emergent issues, a gastroenterologist can be reached at any hour by calling [phone]. Do not use MyChart messaging for urgent concerns.    DIET:  We do recommend a small meal at first, but then you may proceed to your regular diet.  Drink plenty of fluids but you should avoid alcoholic beverages for 24 hours.  ACTIVITY:  You should plan to take it easy for the rest of today and you should NOT  DRIVE or use heavy machinery until tomorrow (because of the sedation medicines used during the test).    FOLLOW UP: Our staff will call the number listed on your records 48-72 hours following your procedure to check on you and address any questions or concerns that you may have regarding the information given to you following your procedure. If we do not reach you, we will leave a message.  We will attempt to reach you two times.  During this call, we will ask if you have developed any symptoms of COVID 19. If you develop any symptoms (ie: fever, flu-like symptoms, shortness of breath, cough etc.) before then, please call [phone].  If you test positive for Covid 19 in the 2 weeks post procedure, please call and report this information to us.    If any biopsies were taken you will be contacted by phone or by letter within the next 1-3 weeks.  Please call us at [phone] if you have not heard about the biopsies in 3 weeks.    SIGNATURES/CONFIDENTIALITY: You and/or your care partner have signed paperwork which will be entered into your electronic medical record.  These signatures attest to the fact that that the information above on your After Visit Summary has been reviewed and is understood.  Full responsibility of the confidentiality of this discharge information lies with you and/or your care-partner. 

## 2021-07-18 NOTE — Telephone Encounter (Signed)
Pt has a procedure this afternoon at 2:30pm and reported to have vomited second part of prep. Pls call her.

## 2021-07-18 NOTE — Progress Notes (Signed)
Called to room to assist during endoscopic procedure.  Patient ID and intended procedure confirmed with present staff. Received instructions for my participation in the procedure from the performing physician.  

## 2021-07-18 NOTE — Progress Notes (Signed)
Sedate, gd SR, tolerated procedure well, VSS, report to RN 

## 2021-07-20 ENCOUNTER — Telehealth: Payer: Self-pay

## 2021-07-20 NOTE — Telephone Encounter (Signed)
  Follow up Call-  Call back number 07/18/2021 02/04/2020  Post procedure Call Back phone  # [phone]  Permission to leave phone message Yes Yes  Some recent data might be hidden     Patient questions:  Do you have a fever, pain , or abdominal swelling? No. Pain Score  0 *  Have you tolerated food without any problems? Yes.    Have you been able to return to your normal activities? Yes.    Do you have any questions about your discharge instructions: Diet   No. Medications  No. Follow up visit  No.  Do you have questions or concerns about your Care? No.  Actions: * If pain score is 4 or above: No action needed, pain <4. Have you developed a fever since your procedure? no  2.   Have you had an respiratory symptoms (SOB or cough) since your procedure? no  3.   Have you tested positive for COVID 19 since your procedure no  4.   Have you had any family members/close contacts diagnosed with the COVID 19 since your procedure?  no   If yes to any of these questions please route to Joylene John, RN and Joella Prince, RN

## 2021-07-20 NOTE — Telephone Encounter (Signed)
Called [phone] and left a message we tried to reach pt for a follow up call. maw

## 2021-07-24 ENCOUNTER — Telehealth: Payer: Self-pay | Admitting: Internal Medicine

## 2021-07-24 NOTE — Telephone Encounter (Signed)
Inbound call from pt wants to know her results. I did advise the pt that it can take up to 10 business days. Pt asked for a call back. Please advise. Thank you

## 2021-07-24 NOTE — Telephone Encounter (Signed)
Please see note Below and advise.

## 2021-07-25 DIAGNOSIS — L720 Epidermal cyst: Secondary | ICD-10-CM | POA: Diagnosis not present

## 2021-07-25 DIAGNOSIS — D225 Melanocytic nevi of trunk: Secondary | ICD-10-CM | POA: Diagnosis not present

## 2021-07-25 DIAGNOSIS — L570 Actinic keratosis: Secondary | ICD-10-CM | POA: Diagnosis not present

## 2021-07-25 DIAGNOSIS — L814 Other melanin hyperpigmentation: Secondary | ICD-10-CM | POA: Diagnosis not present

## 2021-07-25 DIAGNOSIS — L821 Other seborrheic keratosis: Secondary | ICD-10-CM | POA: Diagnosis not present

## 2021-07-25 DIAGNOSIS — D1801 Hemangioma of skin and subcutaneous tissue: Secondary | ICD-10-CM | POA: Diagnosis not present

## 2021-07-25 NOTE — Telephone Encounter (Signed)
I have made my response attached to the patient's pathology results from recent colonoscopy please see that note

## 2021-07-25 NOTE — Telephone Encounter (Signed)
Noted  

## 2021-08-25 DIAGNOSIS — T7840XA Allergy, unspecified, initial encounter: Secondary | ICD-10-CM | POA: Diagnosis not present

## 2021-08-25 DIAGNOSIS — R059 Cough, unspecified: Secondary | ICD-10-CM | POA: Diagnosis not present

## 2021-10-08 DIAGNOSIS — M2021 Hallux rigidus, right foot: Secondary | ICD-10-CM | POA: Diagnosis not present

## 2021-10-08 DIAGNOSIS — M2060 Acquired deformities of toe(s), unspecified, unspecified foot: Secondary | ICD-10-CM | POA: Diagnosis not present

## 2021-10-08 DIAGNOSIS — M2022 Hallux rigidus, left foot: Secondary | ICD-10-CM | POA: Diagnosis not present

## 2021-10-08 DIAGNOSIS — R52 Pain, unspecified: Secondary | ICD-10-CM | POA: Diagnosis not present

## 2021-11-05 ENCOUNTER — Encounter: Payer: Self-pay | Admitting: Obstetrics & Gynecology

## 2022-01-24 DIAGNOSIS — M8589 Other specified disorders of bone density and structure, multiple sites: Secondary | ICD-10-CM | POA: Diagnosis not present

## 2022-01-24 DIAGNOSIS — E538 Deficiency of other specified B group vitamins: Secondary | ICD-10-CM | POA: Diagnosis not present

## 2022-02-12 DIAGNOSIS — Z961 Presence of intraocular lens: Secondary | ICD-10-CM | POA: Diagnosis not present

## 2022-02-12 DIAGNOSIS — H52203 Unspecified astigmatism, bilateral: Secondary | ICD-10-CM | POA: Diagnosis not present

## 2022-02-12 DIAGNOSIS — H524 Presbyopia: Secondary | ICD-10-CM | POA: Diagnosis not present

## 2022-03-18 ENCOUNTER — Other Ambulatory Visit: Payer: Self-pay

## 2022-03-18 DIAGNOSIS — M810 Age-related osteoporosis without current pathological fracture: Secondary | ICD-10-CM

## 2022-03-19 ENCOUNTER — Telehealth: Payer: Self-pay | Admitting: Pharmacy Technician

## 2022-03-19 NOTE — Telephone Encounter (Addendum)
Prolia biv complete: no auth needed

## 2022-04-22 ENCOUNTER — Other Ambulatory Visit (HOSPITAL_COMMUNITY): Payer: Self-pay | Admitting: *Deleted

## 2022-04-23 ENCOUNTER — Inpatient Hospital Stay (HOSPITAL_COMMUNITY): Admission: RE | Admit: 2022-04-23 | Payer: PPO | Source: Ambulatory Visit

## 2022-04-25 DIAGNOSIS — E559 Vitamin D deficiency, unspecified: Secondary | ICD-10-CM | POA: Diagnosis not present

## 2022-04-25 DIAGNOSIS — R7989 Other specified abnormal findings of blood chemistry: Secondary | ICD-10-CM | POA: Diagnosis not present

## 2022-04-25 DIAGNOSIS — I10 Essential (primary) hypertension: Secondary | ICD-10-CM | POA: Diagnosis not present

## 2022-04-25 DIAGNOSIS — E785 Hyperlipidemia, unspecified: Secondary | ICD-10-CM | POA: Diagnosis not present

## 2022-05-15 ENCOUNTER — Telehealth: Payer: Self-pay | Admitting: Pharmacy Technician

## 2022-05-15 ENCOUNTER — Encounter: Payer: Self-pay | Admitting: Oncology

## 2022-05-15 ENCOUNTER — Encounter: Payer: Self-pay | Admitting: Pulmonary Disease

## 2022-05-15 NOTE — Telephone Encounter (Signed)
Auth Submission: NO AUTH NEEDED Payer: [employer] ADVT Medication & CPT/J Code(s) submitted: Prolia (Denosumab) [id number] Route of submission (phone, fax, portal): PHONE: [phone] Auth type: Buy/Bill Units/visits requested: X1 DOSE Reference number: [id number] 05/15/22 @ 9:30 Approval from: 05/15/22 to 11/10/22

## 2022-05-22 ENCOUNTER — Encounter: Payer: Self-pay | Admitting: Oncology

## 2022-05-22 ENCOUNTER — Ambulatory Visit: Payer: PPO

## 2022-05-22 ENCOUNTER — Encounter: Payer: Self-pay | Admitting: Pulmonary Disease

## 2022-05-22 ENCOUNTER — Other Ambulatory Visit (HOSPITAL_COMMUNITY): Payer: Self-pay | Admitting: *Deleted

## 2022-05-23 ENCOUNTER — Ambulatory Visit (HOSPITAL_COMMUNITY)
Admission: RE | Admit: 2022-05-23 | Discharge: 2022-05-23 | Disposition: A | Discharge status: Home / Self Care | Payer: PPO | Source: Ambulatory Visit | Attending: Internal Medicine | Admitting: Internal Medicine

## 2022-05-23 DIAGNOSIS — M810 Age-related osteoporosis without current pathological fracture: Secondary | ICD-10-CM | POA: Insufficient documentation

## 2022-05-23 MED ORDER — DENOSUMAB 60 MG/ML ~~LOC~~ SOSY: 60.0000 mg | PREFILLED_SYRINGE | Freq: Once | SUBCUTANEOUS | Status: DC

## 2022-05-23 MED ORDER — DENOSUMAB 60 MG/ML ~~LOC~~ SOSY
PREFILLED_SYRINGE | SUBCUTANEOUS | Status: AC
  Administered 2022-05-23: 60 mg
  Filled 2022-05-23: qty 1

## 2022-05-28 DIAGNOSIS — Z0000 Encounter for general adult medical examination without abnormal findings: Secondary | ICD-10-CM | POA: Diagnosis not present

## 2022-05-28 DIAGNOSIS — I10 Essential (primary) hypertension: Secondary | ICD-10-CM | POA: Diagnosis not present

## 2022-05-28 DIAGNOSIS — R7989 Other specified abnormal findings of blood chemistry: Secondary | ICD-10-CM | POA: Diagnosis not present

## 2022-05-28 DIAGNOSIS — R945 Abnormal results of liver function studies: Secondary | ICD-10-CM | POA: Diagnosis not present

## 2022-06-04 DIAGNOSIS — F329 Major depressive disorder, single episode, unspecified: Secondary | ICD-10-CM | POA: Diagnosis not present

## 2022-06-04 DIAGNOSIS — K219 Gastro-esophageal reflux disease without esophagitis: Secondary | ICD-10-CM | POA: Diagnosis not present

## 2022-06-04 DIAGNOSIS — R82998 Other abnormal findings in urine: Secondary | ICD-10-CM | POA: Diagnosis not present

## 2022-06-04 DIAGNOSIS — E538 Deficiency of other specified B group vitamins: Secondary | ICD-10-CM | POA: Diagnosis not present

## 2022-06-04 DIAGNOSIS — G250 Essential tremor: Secondary | ICD-10-CM | POA: Diagnosis not present

## 2022-06-04 DIAGNOSIS — Z0000 Encounter for general adult medical examination without abnormal findings: Secondary | ICD-10-CM | POA: Diagnosis not present

## 2022-06-04 DIAGNOSIS — M8580 Other specified disorders of bone density and structure, unspecified site: Secondary | ICD-10-CM | POA: Diagnosis not present

## 2022-06-04 DIAGNOSIS — Z1331 Encounter for screening for depression: Secondary | ICD-10-CM | POA: Diagnosis not present

## 2022-06-04 DIAGNOSIS — I10 Essential (primary) hypertension: Secondary | ICD-10-CM | POA: Diagnosis not present

## 2022-06-04 DIAGNOSIS — Z1339 Encounter for screening examination for other mental health and behavioral disorders: Secondary | ICD-10-CM | POA: Diagnosis not present

## 2022-06-04 DIAGNOSIS — D72819 Decreased white blood cell count, unspecified: Secondary | ICD-10-CM | POA: Diagnosis not present

## 2022-06-17 DIAGNOSIS — C50411 Malignant neoplasm of upper-outer quadrant of right female breast: Secondary | ICD-10-CM | POA: Diagnosis not present

## 2022-06-17 DIAGNOSIS — Z923 Personal history of irradiation: Secondary | ICD-10-CM | POA: Diagnosis not present

## 2022-06-17 DIAGNOSIS — Z87891 Personal history of nicotine dependence: Secondary | ICD-10-CM | POA: Diagnosis not present

## 2022-06-17 DIAGNOSIS — Z9889 Other specified postprocedural states: Secondary | ICD-10-CM | POA: Diagnosis not present

## 2022-06-17 DIAGNOSIS — Z9221 Personal history of antineoplastic chemotherapy: Secondary | ICD-10-CM | POA: Diagnosis not present

## 2022-06-17 DIAGNOSIS — Z853 Personal history of malignant neoplasm of breast: Secondary | ICD-10-CM | POA: Diagnosis not present

## 2022-06-26 DIAGNOSIS — R051 Acute cough: Secondary | ICD-10-CM | POA: Diagnosis not present

## 2022-06-26 DIAGNOSIS — R6883 Chills (without fever): Secondary | ICD-10-CM | POA: Diagnosis not present

## 2022-06-26 DIAGNOSIS — R5383 Other fatigue: Secondary | ICD-10-CM | POA: Diagnosis not present

## 2022-06-26 DIAGNOSIS — R0981 Nasal congestion: Secondary | ICD-10-CM | POA: Diagnosis not present

## 2022-06-26 DIAGNOSIS — Z1152 Encounter for screening for COVID-19: Secondary | ICD-10-CM | POA: Diagnosis not present

## 2022-06-26 DIAGNOSIS — J189 Pneumonia, unspecified organism: Secondary | ICD-10-CM | POA: Diagnosis not present

## 2022-07-03 DIAGNOSIS — J189 Pneumonia, unspecified organism: Secondary | ICD-10-CM | POA: Diagnosis not present

## 2022-07-03 DIAGNOSIS — R5383 Other fatigue: Secondary | ICD-10-CM | POA: Diagnosis not present

## 2022-07-03 DIAGNOSIS — R059 Cough, unspecified: Secondary | ICD-10-CM | POA: Diagnosis not present

## 2022-07-03 DIAGNOSIS — R6883 Chills (without fever): Secondary | ICD-10-CM | POA: Diagnosis not present

## 2022-07-03 DIAGNOSIS — R0981 Nasal congestion: Secondary | ICD-10-CM | POA: Diagnosis not present

## 2022-08-09 DIAGNOSIS — J029 Acute pharyngitis, unspecified: Secondary | ICD-10-CM | POA: Diagnosis not present

## 2022-08-09 DIAGNOSIS — R058 Other specified cough: Secondary | ICD-10-CM | POA: Diagnosis not present

## 2022-08-09 DIAGNOSIS — I10 Essential (primary) hypertension: Secondary | ICD-10-CM | POA: Diagnosis not present

## 2022-08-09 DIAGNOSIS — K219 Gastro-esophageal reflux disease without esophagitis: Secondary | ICD-10-CM | POA: Diagnosis not present

## 2022-08-09 DIAGNOSIS — F329 Major depressive disorder, single episode, unspecified: Secondary | ICD-10-CM | POA: Diagnosis not present

## 2022-08-09 DIAGNOSIS — Z1152 Encounter for screening for COVID-19: Secondary | ICD-10-CM | POA: Diagnosis not present

## 2022-08-09 DIAGNOSIS — R0981 Nasal congestion: Secondary | ICD-10-CM | POA: Diagnosis not present

## 2022-08-28 ENCOUNTER — Encounter: Payer: Self-pay | Admitting: Oncology

## 2022-08-28 ENCOUNTER — Other Ambulatory Visit (HOSPITAL_BASED_OUTPATIENT_CLINIC_OR_DEPARTMENT_OTHER): Payer: Self-pay

## 2022-08-28 ENCOUNTER — Encounter: Payer: Self-pay | Admitting: Pulmonary Disease

## 2022-08-28 MED ORDER — INFLUENZA VAC A&B SA ADJ QUAD 0.5 ML IM PRSY
PREFILLED_SYRINGE | INTRAMUSCULAR | 0 refills | Status: DC
  Filled 2022-08-28: qty 0.5, 1d supply, fill #0

## 2022-08-29 DIAGNOSIS — Z853 Personal history of malignant neoplasm of breast: Secondary | ICD-10-CM | POA: Diagnosis not present

## 2022-08-29 DIAGNOSIS — K219 Gastro-esophageal reflux disease without esophagitis: Secondary | ICD-10-CM | POA: Diagnosis not present

## 2022-08-29 DIAGNOSIS — R053 Chronic cough: Secondary | ICD-10-CM | POA: Diagnosis not present

## 2022-08-29 DIAGNOSIS — J329 Chronic sinusitis, unspecified: Secondary | ICD-10-CM | POA: Diagnosis not present

## 2022-08-29 DIAGNOSIS — H6991 Unspecified Eustachian tube disorder, right ear: Secondary | ICD-10-CM | POA: Diagnosis not present

## 2022-09-06 ENCOUNTER — Other Ambulatory Visit: Payer: Self-pay | Admitting: Adult Health

## 2022-09-06 DIAGNOSIS — R053 Chronic cough: Secondary | ICD-10-CM

## 2022-09-10 DIAGNOSIS — L7211 Pilar cyst: Secondary | ICD-10-CM | POA: Diagnosis not present

## 2022-09-10 DIAGNOSIS — D225 Melanocytic nevi of trunk: Secondary | ICD-10-CM | POA: Diagnosis not present

## 2022-09-10 DIAGNOSIS — L814 Other melanin hyperpigmentation: Secondary | ICD-10-CM | POA: Diagnosis not present

## 2022-09-10 DIAGNOSIS — C44329 Squamous cell carcinoma of skin of other parts of face: Secondary | ICD-10-CM | POA: Diagnosis not present

## 2022-09-10 DIAGNOSIS — L820 Inflamed seborrheic keratosis: Secondary | ICD-10-CM | POA: Diagnosis not present

## 2022-09-10 DIAGNOSIS — L821 Other seborrheic keratosis: Secondary | ICD-10-CM | POA: Diagnosis not present

## 2022-09-10 DIAGNOSIS — L570 Actinic keratosis: Secondary | ICD-10-CM | POA: Diagnosis not present

## 2022-09-10 DIAGNOSIS — C4442 Squamous cell carcinoma of skin of scalp and neck: Secondary | ICD-10-CM | POA: Diagnosis not present

## 2022-09-10 DIAGNOSIS — D485 Neoplasm of uncertain behavior of skin: Secondary | ICD-10-CM | POA: Diagnosis not present

## 2022-09-10 DIAGNOSIS — L739 Follicular disorder, unspecified: Secondary | ICD-10-CM | POA: Diagnosis not present

## 2022-09-10 DIAGNOSIS — D2272 Melanocytic nevi of left lower limb, including hip: Secondary | ICD-10-CM | POA: Diagnosis not present

## 2022-09-16 ENCOUNTER — Ambulatory Visit
Admission: RE | Admit: 2022-09-16 | Discharge: 2022-09-16 | Disposition: A | Discharge status: Home / Self Care | Payer: PPO | Source: Ambulatory Visit | Attending: Adult Health | Admitting: Adult Health

## 2022-09-16 DIAGNOSIS — R053 Chronic cough: Secondary | ICD-10-CM

## 2022-09-16 DIAGNOSIS — Z8701 Personal history of pneumonia (recurrent): Secondary | ICD-10-CM | POA: Diagnosis not present

## 2022-09-16 DIAGNOSIS — J432 Centrilobular emphysema: Secondary | ICD-10-CM | POA: Diagnosis not present

## 2022-09-16 DIAGNOSIS — Z853 Personal history of malignant neoplasm of breast: Secondary | ICD-10-CM | POA: Diagnosis not present

## 2022-09-16 DIAGNOSIS — J9811 Atelectasis: Secondary | ICD-10-CM | POA: Diagnosis not present

## 2022-09-16 MED ORDER — IOPAMIDOL (ISOVUE-300) INJECTION 61%
75.0000 mL | Freq: Once | INTRAVENOUS | Status: AC | PRN
  Administered 2022-09-16: 75 mL via INTRAVENOUS

## 2022-09-19 ENCOUNTER — Other Ambulatory Visit: Payer: PPO

## 2022-09-30 ENCOUNTER — Other Ambulatory Visit (HOSPITAL_BASED_OUTPATIENT_CLINIC_OR_DEPARTMENT_OTHER): Payer: Self-pay

## 2022-09-30 MED ORDER — AREXVY 120 MCG/0.5ML IM SUSR
INTRAMUSCULAR | 0 refills | Status: DC
Start: 2022-09-30 — End: 2022-10-08
  Filled 2022-09-30: qty 0.5, 1d supply, fill #0

## 2022-10-08 ENCOUNTER — Other Ambulatory Visit (HOSPITAL_BASED_OUTPATIENT_CLINIC_OR_DEPARTMENT_OTHER): Payer: Self-pay

## 2022-10-08 ENCOUNTER — Encounter (HOSPITAL_BASED_OUTPATIENT_CLINIC_OR_DEPARTMENT_OTHER): Payer: Self-pay | Admitting: Pulmonary Disease

## 2022-10-08 ENCOUNTER — Ambulatory Visit (INDEPENDENT_AMBULATORY_CARE_PROVIDER_SITE_OTHER): Payer: PPO | Admitting: Pulmonary Disease

## 2022-10-08 VITALS — BP 128/88 | HR 75 | Ht 59.25 in | Wt 163.2 lb

## 2022-10-08 DIAGNOSIS — J42 Unspecified chronic bronchitis: Secondary | ICD-10-CM | POA: Diagnosis not present

## 2022-10-08 MED ORDER — ALBUTEROL SULFATE HFA 108 (90 BASE) MCG/ACT IN AERS: INHALATION_SPRAY | RESPIRATORY_TRACT | 1 refills | Status: DC

## 2022-10-08 NOTE — Addendum Note (Signed)
Addended by: Darliss Ridgel on: 10/08/2022 01:20 PM   Modules accepted: Orders

## 2022-10-08 NOTE — Patient Instructions (Signed)
Chronic cough --CONTINUE Breztri TWO puffs in the morning and evening --CONTINUE Albuterol AS NEEDED shortness of breath or cough --ORDER pulmonary function tests. HOLD Breztri 48 hours before testing for accuracy  Follow-up with me in Dec with PFTs prior to visit

## 2022-10-08 NOTE — Progress Notes (Signed)
Subjective:   PATIENT ID: [name] GENDER: female DOB: June 20, 1941, MRN: [id number]  Chief Complaint  Patient presents with   Consult    Persistent cough emphysema showed up on cxr    Reason for Visit: New consult for emphysema  [name] is a 81 year old female with allergic rhinitis, hx sinus surgery in 1994, hx right breast cancer s/p lumpectomy/chemo/radiation/HRT, osteopenia, depression who presents for new consult for emphysema and chronic cough  Referred by Dr. [name] for emphysema. She was recently treated with prednisone x 5 days on 10/01/22. Which she reports has improved her cough. Had CT scan reporting emphysema. Of note, she was seen by Pulmonary in 2012 and had PFTs completed and dx with cyclical cough. Negative allergy testing.  She reports chronic cough since June. In July had worsening cough while in [place]. Had pneumonia in August and treated with cefdinir and azithromycin. After returning from trip from [place], acquired [place] in September and taken molnuppinivir. Still had lingering symptoms. In October her symptoms of fatigue improved but cough has persisted. Has had to leave during meetings due to coughing and gagging. After taking the RSV vaccine, she had some increased symptoms.Overall symptoms have improved. She was started [place] with some improvement. Feels like going to beach did not help.  She exercising regularly at the pool at [facility]. Has not been walking, difficult walking up on incline. Has cough productive cough throughout the day. Denies wheezing except occasionally. On reflux and allergy meds.  Social History: 1/2 ppd x 20 years. Quit in 1980  I have personally reviewed patient's past medical/family/social history, allergies, current medications.  Past Medical History:  Diagnosis Date   Allergic rhinitis    Allergy    Anemia    Anxiety    Asthma    Blood transfusion without reported diagnosis    Breast cancer ([place]) 2011    right side   Cataract    bil removed   Concussion    Depression    Diverticulitis    Diverticulosis    Endometriosis    GERD (gastroesophageal reflux disease)    Hypertension    Neuromuscular disorder (HCC)    ociptal neuralgia   Osteopenia    Tubular adenoma of colon      Family History  Problem Relation Age of Onset   Heart disease Mother    Heart disease Father    Lung cancer Father        smoker   Heart attack Father    Melanoma Daughter    Colon cancer Neg Hx    Colon polyps Neg Hx    Pancreatic cancer Neg Hx    Esophageal cancer Neg Hx    Rectal cancer Neg Hx    Stomach cancer Neg Hx      Social History   Occupational History   Occupation: fundraising consultant-retired  Tobacco Use   Smoking status: Former    Packs/day: 1.00    Years: 10.00    Total pack years: 10.00    Types: Cigarettes    Quit date: 11/11/1978    Years since quitting: 43.9   Smokeless tobacco: Never  Vaping Use   Vaping Use: Never used  Substance and Sexual Activity   Alcohol use: Yes    Comment: social   Drug use: No   Sexual activity: Yes    Partners: Male    Birth control/protection: Surgical    Comment: hysterectomy    Allergies  Allergen Reactions  Latex Rash   Sulfamethoxazole-Trimethoprim Nausea Only     Outpatient Medications Prior to Visit  Medication Sig Dispense Refill   Budeson-Glycopyrrol-Formoterol (BREZTRI AEROSPHERE) 160-9-4.8 MCG/ACT AERO 2 puffs Inhalation Twice a day. for 30 days     buPROPion (WELLBUTRIN XL) 300 MG 24 hr tablet Take 300 mg by mouth daily.     CALCIUM & MAGNESIUM CARBONATES PO Take 1 tablet by mouth daily.     cetirizine (ZYRTEC) 10 MG tablet Take 10 mg by mouth daily.     [employer] SALINE NOSE SPRAY NA Place 1 spray into the nose 2 (two) times daily as needed. Nasal congestion.     CYANOCOBALAMIN IJ Inject 1 vial as directed See admin instructions. Every three months. Patient had shot on 5/8.     CYANOCOBALAMIN PO Take 1 tablet by mouth  daily.     fexofenadine (ALLEGRA ODT) 30 MG disintegrating tablet Take 30 mg by mouth daily.     fluticasone (FLONASE) 50 MCG/ACT nasal spray Place 2 sprays into the nose as needed.       nebivolol (BYSTOLIC) 5 MG tablet Take 5 mg by mouth daily.       sertraline (ZOLOFT) 50 MG tablet Take 50 mg by mouth daily.     Vitamin D, Ergocalciferol, (DRISDOL) 1.25 MG (50000 UNIT) CAPS capsule Take 1 capsule by mouth once a week.     hyoscyamine (LEVSIN) 0.125 MG tablet See admin instructions.     influenza vaccine adjuvanted (FLUAD) 0.5 ML injection Inject into the muscle. 0.5 mL 0   levocetirizine (XYZAL) 5 MG tablet      montelukast (SINGULAIR) 10 MG tablet Take 10 mg by mouth daily.     mupirocin ointment (BACTROBAN) 2 %      ondansetron (ZOFRAN-ODT) 8 MG disintegrating tablet Take 1 tablet (8 mg total) by mouth every 8 (eight) hours as needed for nausea or vomiting. (Patient not taking: Reported on 07/18/2021) 20 tablet 1   polyethylene glycol (MIRALAX / GLYCOLAX) packet Take 17 g by mouth every 3 (three) days.  (Patient not taking: Reported on 07/18/2021)     RSV vaccine recomb adjuvanted (AREXVY) 120 MCG/0.5ML injection Inject into the muscle. 0.5 mL 0   albuterol (VENTOLIN HFA) 108 (90 Base) MCG/ACT inhaler Use 2 puffs 4x a day prn     No facility-administered medications prior to visit.    Review of Systems  Constitutional:  Negative for chills, diaphoresis, fever, malaise/fatigue and weight loss.  HENT:  Negative for congestion.   Respiratory:  Positive for cough. Negative for hemoptysis, sputum production, shortness of breath and wheezing.   Cardiovascular:  Negative for chest pain, palpitations and leg swelling.     Objective:   Vitals:   10/08/22 1035  BP: 128/88  Pulse: 75  SpO2: 98%  Weight: 163 lb 3.2 oz (74 kg)  Height: 4' 11.25" (1.505 m)   SpO2: 98 % O2 Device: None (Room air)  Physical Exam: General: Well-appearing, no acute distress HENT: , AT Eyes: EOMI, no  scleral icterus Respiratory: Clear to auscultation bilaterally.  No crackles, wheezing or rales Cardiovascular: RRR, -M/R/G, no JVD Extremities:-Edema,-tenderness Neuro: AAO x4, CNII-XII grossly intact Psych: Normal mood, normal affect  Data Reviewed:  Imaging: CT Chest 09/16/22 - Centrilobular emphysema, mild pleural parenchymal scarring in upper lobes with subpleural reticulations in RUL, possible related to radiation. Unchanged atelectasis/scarring in RML. Minimal atelectasis in bases bilaterally.   PFT: 07/16/11 FVC 3.29 (131%) FEV1 2.37 (134%) Ratio 72  TLC 121% DLCO  74%. No sig BD response Interpretation: No obstructive or restrictive defect. Borderline hyperinflation. Mildly reduced DLCO  Labs: CBC    Component Value Date/Time   WBC 5.7 01/20/2020 1413   RBC 4.10 01/20/2020 1413   HGB 13.1 01/20/2020 1413   HGB 12.9 06/30/2018 1122   HGB 12.7 06/18/2017 1257   HCT 38.1 01/20/2020 1413   HCT 36.7 06/18/2017 1257   PLT 357.0 01/20/2020 1413   PLT 318 06/30/2018 1122   PLT 318 06/18/2017 1257   MCV 92.8 01/20/2020 1413   MCV 90.9 06/18/2017 1257   MCH 30.6 06/19/2019 1455   MCHC 34.4 01/20/2020 1413   RDW 13.3 01/20/2020 1413   RDW 13.4 06/18/2017 1257   LYMPHSABS 1.6 01/20/2020 1413   LYMPHSABS 1.5 06/18/2017 1257   MONOABS 0.5 01/20/2020 1413   MONOABS 0.4 06/18/2017 1257   EOSABS 0.2 01/20/2020 1413   EOSABS 0.1 06/18/2017 1257   BASOSABS 0.0 01/20/2020 1413   BASOSABS 0.0 06/18/2017 1257   Abs eos 01/20/20 -200      Assessment & Plan:   Discussion: 81 year old female with allergic rhinitis, hx right breast cancer s/p lumpectomy/chemo/radiation/HRT, osteopenia, depression who presents for new consult for emphysema.   Common causes of cough were discussed including upper airway cough syndrome, reflux and undiagnosed obstructive lung disease. We reviewed evaluation and management for cough as noted below. Her symptoms are likely have multiple contributors with  prior pneumonias including covid in September which has led to prolonged respiratory symptoms. Overall improving but reasonable to rule out underlying obstructive or restrictive disease. Emphysema noted on CT with mild hyperinflation on PFTs in 2012 but will confirm lung defect on up to date PFTs.  Chronic cough --CONTINUE Breztri TWO puffs in the morning and evening --CONTINUE Albuterol AS NEEDED shortness of breath or cough --ORDER pulmonary function tests. HOLD Breztri 48 hours before testing for accuracy --Continue current regimen for allergies/nasal congestion and reflux  Health Maintenance Immunization History  Administered Date(s) Administered   Hepatitis A 04/11/2004   Hepatitis B 04/11/2004   IPV 03/11/2004   Influenza Split 09/12/2010, 08/12/2011   Influenza, High Dose Seasonal PF 08/16/2018   Moderna Sars-Covid-2 Vaccination 11/22/2019, 12/23/2019   Pneumococcal Conjugate-13 11/12/1995   Respiratory Syncytial Virus Vaccine,Recomb Aduvanted(Arexvy) 09/30/2022   Td 04/12/1995   Tdap 04/11/2004   Zoster Recombinat (Shingrix) 12/25/2017, 02/24/2018   Zoster, Live 11/12/1995   CT Lung Screen - not qualified. Quit >15 years ago  No orders of the defined types were placed in this encounter. No orders of the defined types were placed in this encounter.   No follow-ups on file. December after PFTs  I have spent a total time of 45-minutes on the day of the appointment reviewing prior documentation, coordinating care and discussing medical diagnosis and plan with the patient/family. Imaging, labs and tests included in this note have been reviewed and interpreted independently by me.  [place], MD [facility] 10/08/2022 11:59 AM  Office Number [phone]

## 2022-10-10 DIAGNOSIS — C44329 Squamous cell carcinoma of skin of other parts of face: Secondary | ICD-10-CM | POA: Diagnosis not present

## 2022-10-10 DIAGNOSIS — Z85828 Personal history of other malignant neoplasm of skin: Secondary | ICD-10-CM | POA: Diagnosis not present

## 2022-10-23 ENCOUNTER — Ambulatory Visit (INDEPENDENT_AMBULATORY_CARE_PROVIDER_SITE_OTHER): Payer: PPO | Admitting: Pulmonary Disease

## 2022-10-23 ENCOUNTER — Encounter (HOSPITAL_BASED_OUTPATIENT_CLINIC_OR_DEPARTMENT_OTHER): Payer: Self-pay | Admitting: Pulmonary Disease

## 2022-10-23 VITALS — BP 110/78 | HR 82 | Ht <= 58 in | Wt 167.4 lb

## 2022-10-23 DIAGNOSIS — R053 Chronic cough: Secondary | ICD-10-CM

## 2022-10-23 DIAGNOSIS — J42 Unspecified chronic bronchitis: Secondary | ICD-10-CM | POA: Diagnosis not present

## 2022-10-23 LAB — PULMONARY FUNCTION TEST
DL/VA % pred: 82 %
DL/VA: 3.5 ml/min/mmHg/L
DLCO cor % pred: 78 %
DLCO cor: 12.27 ml/min/mmHg
DLCO unc % pred: 78 %
DLCO unc: 12.27 ml/min/mmHg
FEF 25-75 Post: 1.86 L/sec
FEF 25-75 Pre: 1.67 L/sec
FEF2575-%Change-Post: 11 %
FEF2575-%Pred-Post: 171 %
FEF2575-%Pred-Pre: 154 %
FEV1-%Change-Post: 2 %
FEV1-%Pred-Post: 138 %
FEV1-%Pred-Pre: 135 %
FEV1-Post: 2 L
FEV1-Pre: 1.96 L
FEV1FVC-%Change-Post: 0 %
FEV1FVC-%Pred-Pre: 106 %
FEV6-%Change-Post: 1 %
FEV6-%Pred-Post: 137 %
FEV6-%Pred-Pre: 135 %
FEV6-Post: 2.56 L
FEV6-Pre: 2.51 L
FEV6FVC-%Pred-Post: 106 %
FEV6FVC-%Pred-Pre: 106 %
FVC-%Change-Post: 1 %
FVC-%Pred-Post: 129 %
FVC-%Pred-Pre: 126 %
FVC-Post: 2.56 L
FVC-Pre: 2.51 L
Post FEV1/FVC ratio: 78 %
Post FEV6/FVC ratio: 100 %
Pre FEV1/FVC ratio: 78 %
Pre FEV6/FVC Ratio: 100 %
RV % pred: 90 %
RV: 1.96 L
TLC % pred: 104 %
TLC: 4.5 L

## 2022-10-23 MED ORDER — IPRATROPIUM BROMIDE 0.06 % NA SOLN: 2.0000 | Freq: Three times a day (TID) | NASAL | 12 refills | Status: DC | PRN

## 2022-10-23 NOTE — Patient Instructions (Signed)
Chronic cough   --STOP Breztri --OK to use Breztri during illness  --CONTINUE flonase in morning --START atrovent  in the midday and evening --CONTINUE Albuterol AS NEEDED shortness of breath or cough --Stay on omeprazole 40 mg total for 8 weeks (one additional month)  Emphysema on CT --Reviewed PFTs. Normal function --No bronchodilators indicated  Follow-up with me in 3 months

## 2022-10-23 NOTE — Patient Instructions (Signed)
Full PFT Performed Today  

## 2022-10-23 NOTE — Progress Notes (Signed)
Subjective:   PATIENT ID: [name] GENDER: female DOB: [birth year], MRN: [id number]  Chief Complaint  Patient presents with   Follow-up    Pft results and wants to know why she's coughing    Reason for Visit: Follow-up  [name] is a 81 year old female with allergic rhinitis, hx sinus surgery in 1994, hx right breast cancer s/p lumpectomy/chemo/radiation/HRT, osteopenia, depression who presents for follow-up emphysema and chronic cough.  Initial consult Referred by Dr. [name] for emphysema. She was recently treated with prednisone x 5 days on 10/01/22. Which she reports has improved her cough. Had CT scan reporting emphysema. Of note, she was seen by Pulmonary in 2012 and had PFTs completed and dx with cyclical cough. Negative allergy testing.  She reports chronic cough since June. In July had worsening cough while in [place]. Had pneumonia in August and treated with cefdinir and azithromycin. After returning from trip from [place], acquired [place] in September and taken molnuppinivir. Still had lingering symptoms. In October her symptoms of fatigue improved but cough has persisted. Has had to leave during meetings due to coughing and gagging. After taking the RSV vaccine, she had some increased symptoms.Overall symptoms have improved. She was started [place] with some improvement. Feels like going to beach did not help.  She exercising regularly at the pool at [facility]. Has not been walking, difficult walking up on incline. Has cough productive cough throughout the day. Denies wheezing except occasionally. On reflux and allergy meds.  10/23/22 Daughter present, PA Roe Coombs, who provides additional history.  No improvement in cough with Breztri. Prednisone helped. Now she is coughing more with mixed production. The nasal congestion persists with Allegra and zyrtec and flonase in the morning. Denies wheezing. Ricola cough drops improve the cough some.  Social History: 1/2  ppd x 20 years. Quit in 1980  Past Medical History:  Diagnosis Date   Allergic rhinitis    Allergy    Anemia    Anxiety    Asthma    Blood transfusion without reported diagnosis    Breast cancer ([place]) 2011   right side   Cataract    bil removed   Concussion    Depression    Diverticulitis    Diverticulosis    Endometriosis    GERD (gastroesophageal reflux disease)    Hypertension    Neuromuscular disorder (HCC)    ociptal neuralgia   Osteopenia    Tubular adenoma of colon      Family History  Problem Relation Age of Onset   Heart disease Mother    Heart disease Father    Lung cancer Father        smoker   Heart attack Father    Melanoma Daughter    Colon cancer Neg Hx    Colon polyps Neg Hx    Pancreatic cancer Neg Hx    Esophageal cancer Neg Hx    Rectal cancer Neg Hx    Stomach cancer Neg Hx      Social History   Occupational History   Occupation: fundraising consultant-retired  Tobacco Use   Smoking status: Former    Packs/day: 1.00    Years: 10.00    Total pack years: 10.00    Types: Cigarettes    Quit date: 11/11/1978    Years since quitting: 43.9   Smokeless tobacco: Never  Vaping Use   Vaping Use: Never used  Substance and Sexual Activity   Alcohol use: Yes  Comment: social   Drug use: No   Sexual activity: Yes    Partners: Male    Birth control/protection: Surgical    Comment: hysterectomy    Allergies  Allergen Reactions   Latex Rash   Sulfamethoxazole-Trimethoprim Nausea Only     Outpatient Medications Prior to Visit  Medication Sig Dispense Refill   albuterol (VENTOLIN HFA) 108 (90 Base) MCG/ACT inhaler Use 2 puffs 4x a day prn 18 g 1   Budeson-Glycopyrrol-Formoterol (BREZTRI AEROSPHERE) 160-9-4.8 MCG/ACT AERO 2 puffs Inhalation Twice a day. for 30 days     buPROPion (WELLBUTRIN XL) 300 MG 24 hr tablet Take 300 mg by mouth daily.     CALCIUM & MAGNESIUM CARBONATES PO Take 1 tablet by mouth daily.     cetirizine (ZYRTEC) 10 MG  tablet Take 10 mg by mouth daily.     [employer] SALINE NOSE SPRAY NA Place 1 spray into the nose 2 (two) times daily as needed. Nasal congestion.     CYANOCOBALAMIN IJ Inject 1 vial as directed See admin instructions. Every three months. Patient had shot on 5/8.     CYANOCOBALAMIN PO Take 1 tablet by mouth daily.     fexofenadine (ALLEGRA ODT) 30 MG disintegrating tablet Take 30 mg by mouth daily.     fluticasone (FLONASE) 50 MCG/ACT nasal spray Place 2 sprays into the nose as needed.       nebivolol (BYSTOLIC) 5 MG tablet Take 5 mg by mouth daily.       omeprazole (PRILOSEC) 20 MG capsule Take 20 mg by mouth daily.     sertraline (ZOLOFT) 50 MG tablet Take 50 mg by mouth daily.     Vitamin D, Ergocalciferol, (DRISDOL) 1.25 MG (50000 UNIT) CAPS capsule Take 1 capsule by mouth once a week.     No facility-administered medications prior to visit.    Review of Systems  Constitutional:  Negative for chills, diaphoresis, fever, malaise/fatigue and weight loss.  HENT:  Negative for congestion.   Respiratory:  Positive for cough and sputum production. Negative for hemoptysis, shortness of breath and wheezing.   Cardiovascular:  Negative for chest pain, palpitations and leg swelling.     Objective:   Vitals:   10/23/22 1441  BP: 110/78  Pulse: 82  SpO2: 96%  Weight: 167 lb 6.4 oz (75.9 kg)  Height: '4\' 10"'$  (1.473 m)   SpO2: 96 % O2 Device: None (Room air)  Physical Exam: General: Well-appearing, no acute distress HENT: [place], AT Eyes: EOMI, no scleral icterus Respiratory: Clear to auscultation bilaterally.  No crackles, wheezing or rales Cardiovascular: RRR, -M/R/G, no JVD Extremities:-Edema,-tenderness Neuro: AAO x4, CNII-XII grossly intact Psych: Normal mood, normal affect  Data Reviewed:  Imaging: CT Chest 09/16/22 - Centrilobular emphysema, mild pleural parenchymal scarring in upper lobes with subpleural reticulations in RUL, possible related to radiation. Unchanged  atelectasis/scarring in RML. Minimal atelectasis in bases bilaterally.   PFT: 07/16/11 FVC 3.29 (131%) FEV1 2.37 (134%) Ratio 72  TLC 121% DLCO 74%. No sig BD response Interpretation: No obstructive or restrictive defect. Borderline hyperinflation. Mildly reduced DLCO  10/28/22 FVC 2.56 (129%) FEV1 2.00 (138%) Ratio 78  TLC 104% DLCO 78% Interpretation: No obstructive or restrictive defect is present. Mildly reduced DLCO with improvement compared to 2012 PFTs.  Labs: CBC    Component Value Date/Time   WBC 5.7 01/20/2020 1413   RBC 4.10 01/20/2020 1413   HGB 13.1 01/20/2020 1413   HGB 12.9 06/30/2018 1122   HGB 12.7 06/18/2017 1257  HCT 38.1 01/20/2020 1413   HCT 36.7 06/18/2017 1257   PLT 357.0 01/20/2020 1413   PLT 318 06/30/2018 1122   PLT 318 06/18/2017 1257   MCV 92.8 01/20/2020 1413   MCV 90.9 06/18/2017 1257   MCH 30.6 06/19/2019 1455   MCHC 34.4 01/20/2020 1413   RDW 13.3 01/20/2020 1413   RDW 13.4 06/18/2017 1257   LYMPHSABS 1.6 01/20/2020 1413   LYMPHSABS 1.5 06/18/2017 1257   MONOABS 0.5 01/20/2020 1413   MONOABS 0.4 06/18/2017 1257   EOSABS 0.2 01/20/2020 1413   EOSABS 0.1 06/18/2017 1257   BASOSABS 0.0 01/20/2020 1413   BASOSABS 0.0 06/18/2017 1257   Abs eos 01/20/20 -200      Assessment & Plan:   Discussion: 81 year old female with allergic rhinitis, hx right breast cancer s/p lumpectomy/chemo/radiation/HRT, osteopenia, depression who presents for cough.  PFTs reviewed with no obstructive defect. Mildly reduced DLCO however borderline and improved compared to 2012. Bronchodilators are ineffective so will discontinue. Discussed again the causes of cough including upper airway cough syndrome and reflux.   Her cough could have multiple contributors with prior pneumonias including covid in September which has led to prolonged respiratory symptoms. Overall improving. Emphysema noted on CT however with no obstructive defect on PFTs, not clinically significant  and not responsive to bronchodilators. May consider trial of inhalers again during acute illness.  Chronic cough: Post-viral vs upper airway cough vs reflux --STOP Breztri --OK to use Breztri during illness  --CONTINUE flonase in morning --START atrovent  in the midday and evening --CONTINUE Albuterol AS NEEDED shortness of breath or cough --Stay on omeprazole 40 mg total for 8 weeks (one additional month)  Emphysema, mild --Reviewed PFTs. Normal function  Health Maintenance Immunization History  Administered Date(s) Administered   Hepatitis A 04/11/2004   Hepatitis B 04/11/2004   IPV 03/11/2004   Influenza Split 09/12/2010, 08/12/2011   Influenza, High Dose Seasonal PF 08/16/2018   Moderna Sars-Covid-2 Vaccination 11/22/2019, 12/23/2019   Pneumococcal Conjugate-13 11/12/1995   Respiratory Syncytial Virus Vaccine,Recomb Aduvanted(Arexvy) 09/30/2022   Td 04/12/1995   Tdap 04/11/2004   Zoster Recombinat (Shingrix) 12/25/2017, 02/24/2018   Zoster, Live 11/12/1995   CT Lung Screen - not qualified. Quit >15 years ago  No orders of the defined types were placed in this encounter.  Meds ordered this encounter  Medications   ipratropium (ATROVENT) 0.06 % nasal spray    Sig: Place 2 sprays into both nostrils 3 (three) times daily as needed for rhinitis.    Dispense:  15 mL    Refill:  12    Return in about 3 months (around 01/22/2023).   I have spent a total time of 32-minutes on the day of the appointment including chart review, data review, collecting history, coordinating care and discussing medical diagnosis and plan with the patient/family. Past medical history, allergies, medications were reviewed. Pertinent imaging, labs and tests included in this note have been reviewed and interpreted independently by me.  [place], MD [facility] 10/23/2022 3:05 PM  Office Number [phone]

## 2022-10-23 NOTE — Progress Notes (Signed)
Full PFT Performed Today  

## 2022-10-28 ENCOUNTER — Encounter (HOSPITAL_BASED_OUTPATIENT_CLINIC_OR_DEPARTMENT_OTHER): Payer: Self-pay | Admitting: Pulmonary Disease

## 2022-11-13 DIAGNOSIS — L245 Irritant contact dermatitis due to other chemical products: Secondary | ICD-10-CM | POA: Diagnosis not present

## 2022-11-13 DIAGNOSIS — Z85828 Personal history of other malignant neoplasm of skin: Secondary | ICD-10-CM | POA: Diagnosis not present

## 2022-12-11 ENCOUNTER — Encounter: Payer: Self-pay | Admitting: Pulmonary Disease

## 2022-12-11 ENCOUNTER — Encounter: Payer: Self-pay | Admitting: Oncology

## 2022-12-11 DIAGNOSIS — L570 Actinic keratosis: Secondary | ICD-10-CM | POA: Diagnosis not present

## 2022-12-11 DIAGNOSIS — Z85828 Personal history of other malignant neoplasm of skin: Secondary | ICD-10-CM | POA: Diagnosis not present

## 2022-12-24 ENCOUNTER — Ambulatory Visit
Admission: RE | Admit: 2022-12-24 | Discharge: 2022-12-24 | Disposition: A | Discharge status: Home / Self Care | Payer: PPO | Source: Ambulatory Visit | Attending: Family Medicine | Admitting: Family Medicine

## 2022-12-24 ENCOUNTER — Other Ambulatory Visit: Payer: Self-pay | Admitting: Family Medicine

## 2022-12-24 DIAGNOSIS — R1032 Left lower quadrant pain: Secondary | ICD-10-CM

## 2022-12-24 DIAGNOSIS — K5732 Diverticulitis of large intestine without perforation or abscess without bleeding: Secondary | ICD-10-CM | POA: Diagnosis not present

## 2022-12-24 DIAGNOSIS — K5792 Diverticulitis of intestine, part unspecified, without perforation or abscess without bleeding: Secondary | ICD-10-CM

## 2022-12-24 DIAGNOSIS — N8189 Other female genital prolapse: Secondary | ICD-10-CM | POA: Diagnosis not present

## 2022-12-24 DIAGNOSIS — K449 Diaphragmatic hernia without obstruction or gangrene: Secondary | ICD-10-CM | POA: Diagnosis not present

## 2022-12-24 DIAGNOSIS — N3289 Other specified disorders of bladder: Secondary | ICD-10-CM | POA: Diagnosis not present

## 2022-12-24 DIAGNOSIS — E871 Hypo-osmolality and hyponatremia: Secondary | ICD-10-CM | POA: Diagnosis not present

## 2022-12-24 MED ORDER — IOPAMIDOL (ISOVUE-300) INJECTION 61%
100.0000 mL | Freq: Once | INTRAVENOUS | Status: AC | PRN
  Administered 2022-12-24: 100 mL via INTRAVENOUS

## 2023-01-20 ENCOUNTER — Ambulatory Visit (HOSPITAL_COMMUNITY)
Admission: RE | Admit: 2023-01-20 | Discharge: 2023-01-20 | Disposition: A | Discharge status: Home / Self Care | Payer: PPO | Source: Ambulatory Visit | Attending: Internal Medicine | Admitting: Internal Medicine

## 2023-01-20 ENCOUNTER — Ambulatory Visit (HOSPITAL_BASED_OUTPATIENT_CLINIC_OR_DEPARTMENT_OTHER): Payer: PPO | Admitting: Pulmonary Disease

## 2023-01-20 ENCOUNTER — Telehealth: Payer: Self-pay | Admitting: Internal Medicine

## 2023-01-20 ENCOUNTER — Other Ambulatory Visit: Payer: Self-pay | Admitting: Internal Medicine

## 2023-01-20 ENCOUNTER — Other Ambulatory Visit: Payer: Self-pay

## 2023-01-20 ENCOUNTER — Other Ambulatory Visit (INDEPENDENT_AMBULATORY_CARE_PROVIDER_SITE_OTHER): Payer: PPO

## 2023-01-20 DIAGNOSIS — R109 Unspecified abdominal pain: Secondary | ICD-10-CM

## 2023-01-20 DIAGNOSIS — K5732 Diverticulitis of large intestine without perforation or abscess without bleeding: Secondary | ICD-10-CM

## 2023-01-20 DIAGNOSIS — E871 Hypo-osmolality and hyponatremia: Secondary | ICD-10-CM

## 2023-01-20 DIAGNOSIS — R112 Nausea with vomiting, unspecified: Secondary | ICD-10-CM

## 2023-01-20 DIAGNOSIS — K449 Diaphragmatic hernia without obstruction or gangrene: Secondary | ICD-10-CM | POA: Diagnosis not present

## 2023-01-20 DIAGNOSIS — R1110 Vomiting, unspecified: Secondary | ICD-10-CM | POA: Diagnosis not present

## 2023-01-20 LAB — COMPREHENSIVE METABOLIC PANEL
ALT: 15 U/L (ref 0–35)
AST: 20 U/L (ref 0–37)
Albumin: 3.8 g/dL (ref 3.5–5.2)
Alkaline Phosphatase: 73 U/L (ref 39–117)
BUN: 8 mg/dL (ref 6–23)
CO2: 24 mEq/L (ref 19–32)
Calcium: 9.1 mg/dL (ref 8.4–10.5)
Chloride: 93 mEq/L — ABNORMAL LOW (ref 96–112)
Creatinine, Ser: 0.77 mg/dL (ref 0.40–1.20)
GFR: 72.35 mL/min (ref >60.00)
Glucose, Bld: 89 mg/dL (ref 70–99)
Potassium: 4.4 mEq/L (ref 3.5–5.1)
Sodium: 125 mEq/L — ABNORMAL LOW (ref 135–145)
Total Bilirubin: 0.5 mg/dL (ref 0.2–1.2)
Total Protein: 7 g/dL (ref 6.0–8.3)

## 2023-01-20 MED ORDER — IOHEXOL 9 MG/ML PO SOLN
500.0000 mL | ORAL | Status: AC
  Administered 2023-01-20: 1000 mL via ORAL

## 2023-01-20 MED ORDER — IOHEXOL 9 MG/ML PO SOLN
ORAL | Status: AC
  Filled 2023-01-20: qty 1000

## 2023-01-20 MED ORDER — IOHEXOL 300 MG/ML  SOLN
100.0000 mL | Freq: Once | INTRAMUSCULAR | Status: AC | PRN
  Administered 2023-01-20: 80 mL via INTRAVENOUS

## 2023-01-20 MED ORDER — MOXIFLOXACIN HCL 400 MG PO TABS: 400.0000 mg | ORAL_TABLET | Freq: Every day | ORAL | 0 refills | Status: DC

## 2023-01-20 NOTE — Telephone Encounter (Signed)
Pt made aware of the CT scan that was ordered and scheduled for pt today  01/20/2023 at 5:30 PM at [facility]. Pt to arrive at 3:00 PM. Start drinking first bottle of Contrast at 3:30 PM and second bottle at 4:30 pm. Pt to remain NPO 4 hours prior to CT scan. Pt made aware   Pt notified to have labs drawn in our basement prior to going to [place] [facility] verbalized understanding with all questions answered.

## 2023-01-20 NOTE — Telephone Encounter (Signed)
Patient has been treated for diverticulitis over the last month, on her second course of Augmentin and 3 days in to her second course  She sent me a text message today stating that after 3.5 days of Augmentin she is no better.  Pain persisting since Friday, 01/17/2023.  She is on a full liquid diet.  She had vomiting yesterday.  Please contact the patient she needs a CBC, CMP And an urgent repeat CT scan of the abdomen and pelvis with contrast  I will work to get her into my office later this week

## 2023-01-20 NOTE — Telephone Encounter (Signed)
I can see her this Wednesday 01/22/23 at 1130 am if that works for her I have limited clinic availability this week and will be out of town [employer] PM and Friday

## 2023-01-20 NOTE — Telephone Encounter (Signed)
Pt made aware of Dr. Hilarie Fredrickson recommendations: Pt orders for labs placed in EPIC. Pt made aware. Order for STAT CT scan placed in epic. Message sent  to PA team to assist with getting authorization so pt can be scheduled for STAT CT scan Pt made aware that I will call her back with details for the CT scan. Pt stated that she last ate or drink something was this AM at 9:00 AM

## 2023-01-21 ENCOUNTER — Other Ambulatory Visit (INDEPENDENT_AMBULATORY_CARE_PROVIDER_SITE_OTHER): Payer: PPO

## 2023-01-21 DIAGNOSIS — K5732 Diverticulitis of large intestine without perforation or abscess without bleeding: Secondary | ICD-10-CM | POA: Diagnosis not present

## 2023-01-21 LAB — CBC WITH DIFFERENTIAL/PLATELET
Basophils Absolute: 0 10*3/uL (ref 0.0–0.1)
Basophils Relative: 0.8 % (ref 0.0–3.0)
Eosinophils Absolute: 0.1 10*3/uL (ref 0.0–0.7)
Eosinophils Relative: 2.3 % (ref 0.0–5.0)
HCT: 42.6 % (ref 36.0–46.0)
Hemoglobin: 14.5 g/dL (ref 12.0–15.0)
Lymphocytes Relative: 27.7 % (ref 12.0–46.0)
Lymphs Abs: 1.2 10*3/uL (ref 0.7–4.0)
MCHC: 33.9 g/dL (ref 30.0–36.0)
MCV: 92.9 fl (ref 78.0–100.0)
Monocytes Absolute: 0.3 10*3/uL (ref 0.1–1.0)
Monocytes Relative: 8.1 % (ref 3.0–12.0)
Neutro Abs: 2.7 10*3/uL (ref 1.4–7.7)
Neutrophils Relative %: 61.1 % (ref 43.0–77.0)
Platelets: 348 10*3/uL (ref 150.0–400.0)
RBC: 4.58 Mil/uL (ref 3.87–5.11)
RDW: 13 % (ref 11.5–15.5)
WBC: 4.3 10*3/uL (ref 4.0–10.5)

## 2023-01-21 LAB — BASIC METABOLIC PANEL
BUN: 9 mg/dL (ref 6–23)
CO2: 25 mEq/L (ref 19–32)
Calcium: 9.6 mg/dL (ref 8.4–10.5)
Chloride: 100 mEq/L (ref 96–112)
Creatinine, Ser: 0.74 mg/dL (ref 0.40–1.20)
GFR: 75.88 mL/min (ref >60.00)
Glucose, Bld: 114 mg/dL — ABNORMAL HIGH (ref 70–99)
Potassium: 4.2 mEq/L (ref 3.5–5.1)
Sodium: 133 mEq/L — ABNORMAL LOW (ref 135–145)

## 2023-01-21 NOTE — Telephone Encounter (Signed)
Pt made aware of Dr. Hilarie Fredrickson recommendations: Pt was scheduled for an office visit on 01/22/2023 at 11:30 AM: Pt made aware Pt requested Diverticulitis diet be sent to her. Sent via my chart. Pt made aware:  Pt verbalized understanding with all questions answered.

## 2023-01-22 ENCOUNTER — Ambulatory Visit: Payer: PPO | Admitting: Internal Medicine

## 2023-01-22 ENCOUNTER — Encounter: Payer: Self-pay | Admitting: Internal Medicine

## 2023-01-22 VITALS — BP 120/70 | HR 64 | Ht <= 58 in | Wt 164.4 lb

## 2023-01-22 DIAGNOSIS — K5732 Diverticulitis of large intestine without perforation or abscess without bleeding: Secondary | ICD-10-CM

## 2023-01-22 DIAGNOSIS — K5900 Constipation, unspecified: Secondary | ICD-10-CM

## 2023-01-22 DIAGNOSIS — R053 Chronic cough: Secondary | ICD-10-CM

## 2023-01-22 MED ORDER — OMEPRAZOLE 20 MG PO CPDR: 20.0000 mg | DELAYED_RELEASE_CAPSULE | Freq: Every day | ORAL | 3 refills | Status: DC

## 2023-01-22 NOTE — Progress Notes (Signed)
Subjective:    Patient ID: [name], female    DOB: 06-09-1941, 82 y.o.   MRN: [id number]  HPI [name] is an 82 year old female with a history of diverticular disease and recurrent diverticulitis, prior adenomatous polyps, history of ischemic colitis, prior breast cancer, chronic cough who is here for follow-up in the setting of recent diverticulitis.  She is here alone today.  She developed recurrent diverticulitis and completed Augmentin but never really reached remission.  She contacted me and we repeated a CT scan which did confirm noncomplicated sigmoid diverticulitis.  I switched her to moxifloxacin and today is day #2 of this medication.  She also has been eating a initially liquid diet but now low fiber.  She is resume MiraLAX and is having soft bowel movements 2-3 times a day.  No blood in stool or melena.  Abdominal pain is much better.  No fever or chills.  Prior to her CT scan her sodium was low but she states that when she came in she had drank 6 glasses of water to make it easier for venous blood sampling and she feels that she may have caused some delusional low sodium.  We repeated her sodium the next day and it was back to her baseline of 133.  She had been using Benefiber but this seemed to make her more constipated and bloating of late so she is most recently stopped it.  She has been dealing with a chronic cough since August and September 2023 when she had pneumonia in August and then [place] in September.  She has seen Dr. Loanne Drilling with pulmonology and fortunately her PFTs were rather normal.  She has been taking Allegra and Zyrtec and put back on Prilosec 20.  She has found this to be helpful though the cough is not completely gone.   Review of Systems As per HPI, otherwise negative  Current Medications, Allergies, Past Medical History, Past Surgical History, Family History and Social History were reviewed in [employer] record.     Objective:   Physical Exam BP 120/70 (BP Location: Left Arm, Patient Position: Sitting, Cuff Size: Normal)   Pulse 64   Ht '4\' 10"'$  (1.473 m)   Wt 164 lb 6 oz (74.6 kg)   BMI 34.35 kg/m  Gen: awake, alert, NAD HEENT: anicteric  Abd: soft, mildly tender in the left mid lower quadrant without rebound or guarding, nondistended, +BS throughout, no hepatosplenomegaly Ext: no c/c/e Neuro: nonfocal  CT ABDOMEN AND PELVIS WITH CONTRAST   TECHNIQUE: Multidetector CT imaging of the abdomen and pelvis was performed using the standard protocol following bolus administration of intravenous contrast.   RADIATION DOSE REDUCTION: This exam was performed according to the departmental dose-optimization program which includes automated exposure control, adjustment of the mA and/or kV according to patient size and/or use of iterative reconstruction technique.   CONTRAST:  69m OMNIPAQUE IOHEXOL 300 MG/ML  SOLN   COMPARISON:  12/24/2022   FINDINGS: Lower chest: Clear lung bases. Normal heart size without pericardial or pleural effusion.   Hepatobiliary: Normal liver. Normal gallbladder, without biliary ductal dilatation.   Pancreas: Normal, without mass or ductal dilatation.   Spleen: Normal in size, without focal abnormality.   Adrenals/Urinary Tract: Normal adrenal glands. Normal kidneys, without hydronephrosis. Normal urinary bladder.   Stomach/Bowel: Tiny hiatal hernia.  Normal remainder of the stomach.   Extensive colonic diverticulosis. Mild proximal sigmoid pericolonic edema is felt to be similar, including on 60/2. No free perforation or  drainable fluid collection. Normal terminal ileum. Appendix not visualized. Normal small bowel.   Vascular/Lymphatic: Aortic atherosclerosis. No abdominopelvic adenopathy.   Reproductive: Hysterectomy.  No adnexal mass.   Other: No significant free fluid.   Musculoskeletal: Lumbosacral spondylosis.   IMPRESSION: Similar mild proximal  sigmoid uncomplicated diverticulitis.   Aortic Atherosclerosis (ICD10-I70.0).   Tiny hiatal hernia.     Electronically Signed   By: Abigail Miyamoto M.D.   On: 01/20/2023 18:16          Assessment & Plan:   82 year old female with a history of diverticular disease and recurrent diverticulitis, prior adenomatous polyps, history of ischemic colitis, prior breast cancer, chronic cough who is here for follow-up in the setting of recent diverticulitis.   Acute diverticulitis --she did not fully resolve with Augmentin.  We have changed her to moxifloxacin and she is improving.  Certainly nontoxic today.  No complicating features by CT.  Normal white count. --Continue moxifloxacin 400 mg daily for 7 days --Low fiber diet while actively inflamed and then relatively high-fiber overall; she will be meeting with nutritionist at [facility] soon. --She is continuing probiotic --She is to let me know if symptoms fail to resolve  2.  Mild constipation --worsened with diverticulitis.  I encouraged her to continue the MiraLAX daily.  She can titrate this to effect.  Overall my suspicion is that she will do best to stay on some dose of MiraLAX. --MiraLAX daily  3.  Chronic cough/prior nausea --her nausea in the past was PPI responsive.  Her chronic cough seems to be better with low-dose omeprazole.  I think this is a relatively safe drug for her and okay to continue --Refill omeprazole 20 mg daily  4.  Hyponatremia --resolved and quite possibly dilutional given that she drink excess water prior to the lab draw.  Repeat sodium at her baseline.  She prefers to follow-up as needed.  30 minutes total spent today including patient facing time, coordination of care, reviewing medical history/procedures/pertinent radiology studies, and documentation of the encounter.

## 2023-01-22 NOTE — Patient Instructions (Signed)
_______________________________________________________  If your blood pressure at your visit was 140/90 or greater, please contact your primary care physician to follow up on this. _______________________________________________________  If you are age 82 or older, your body mass index should be between 23-30. Your Body mass index is 34.35 kg/m. If this is out of the aforementioned range listed, please consider follow up with your Primary Care Provider. ________________________________________________________  The [facility] providers would like to encourage you to use [facility] to communicate with providers for non-urgent requests or questions.  Due to long hold times on the telephone, sending your provider a message by [facility] may be a faster and more efficient way to get a response.  Please allow 48 business hours for a response.  Please remember that this is for non-urgent requests.  _______________________________________________________  We have sent the following medications to your pharmacy for you to pick up at your convenience:  CONTINUE: omeprazole   You will follow up as needed   Thank you for entrusting me with your care and choosing [facility].  Dr Hilarie Fredrickson

## 2023-01-29 ENCOUNTER — Encounter (HOSPITAL_BASED_OUTPATIENT_CLINIC_OR_DEPARTMENT_OTHER): Payer: Self-pay | Admitting: Pulmonary Disease

## 2023-01-29 ENCOUNTER — Ambulatory Visit (INDEPENDENT_AMBULATORY_CARE_PROVIDER_SITE_OTHER): Payer: PPO | Admitting: Pulmonary Disease

## 2023-01-29 VITALS — BP 120/60 | HR 83 | Ht <= 58 in | Wt 166.0 lb

## 2023-01-29 DIAGNOSIS — R053 Chronic cough: Secondary | ICD-10-CM

## 2023-01-29 NOTE — Progress Notes (Signed)
Subjective:   PATIENT ID: [name] GENDER: female DOB: 06/04/1941, MRN: [id number]  Chief Complaint  Patient presents with   Follow-up    Cough has been doing better    Reason for Visit: Follow-up  [name] is a 82 year old female with allergic rhinitis, hx sinus surgery in 1994, hx right breast cancer s/p lumpectomy/chemo/radiation/HRT, osteopenia, depression who presents for follow-up emphysema and chronic cough.  Initial consult Referred by Dr. [name] for emphysema. She was recently treated with prednisone x 5 days on 10/01/22. Which she reports has improved her cough. Had CT scan reporting emphysema. Of note, she was seen by Pulmonary in 2012 and had PFTs completed and dx with cyclical cough. Negative allergy testing.  She reports chronic cough since June. In July had worsening cough while in [place]. Had pneumonia in August and treated with cefdinir and azithromycin. After returning from trip from [place], acquired [place] in September and taken molnuppinivir. Still had lingering symptoms. In October her symptoms of fatigue improved but cough has persisted. Has had to leave during meetings due to coughing and gagging. After taking the RSV vaccine, she had some increased symptoms.Overall symptoms have improved. She was started [place] with some improvement. Feels like going to beach did not help.  She exercising regularly at the pool at [facility]. Has not been walking, difficult walking up on incline. Has cough productive cough throughout the day. Denies wheezing except occasionally. On reflux and allergy meds.  10/23/22 Daughter present, PA Roe Coombs, who provides additional history.  No improvement in cough with Breztri. Prednisone helped. Now she is coughing more with mixed production. The nasal congestion persists with Allegra and zyrtec and flonase in the morning. Denies wheezing. Ricola cough drops improve the cough some.  01/29/23 Since our last visit, she  reports cough has improved. She is on flonase, allegra and zyrtec. On prilosec daily and noticed nausea when trying to self-discontinue. Since our last visit she has been treated for diverticulitis x 2. Planning to go [place] next month.  Social History: 1/2 ppd x 20 years. Quit in 1980  Past Medical History:  Diagnosis Date   Allergic rhinitis    Allergy    Anemia    Anxiety    Asthma    Blood transfusion without reported diagnosis    Breast cancer ([place]) 2011   right side   Cataract    bil removed   Concussion    COVID    Depression    Diverticulitis    Diverticulosis    Endometriosis    GERD (gastroesophageal reflux disease)    Hypertension    Neuromuscular disorder ([place])    ociptal neuralgia   Osteopenia    Pneumonia    Tubular adenoma of colon      Family History  Problem Relation Age of Onset   Heart disease Mother    Heart disease Father    Lung cancer Father        smoker   Heart attack Father    Melanoma Daughter    Colon cancer Neg Hx    Colon polyps Neg Hx    Pancreatic cancer Neg Hx    Esophageal cancer Neg Hx    Rectal cancer Neg Hx    Stomach cancer Neg Hx      Social History   Occupational History   Occupation: fundraising consultant-retired  Tobacco Use   Smoking status: Former    Packs/day: 1.00    Years: 10.00  Additional pack years: 0.00    Total pack years: 10.00    Types: Cigarettes    Quit date: 11/11/1978    Years since quitting: 44.2   Smokeless tobacco: Never  Vaping Use   Vaping Use: Never used  Substance and Sexual Activity   Alcohol use: Yes    Comment: social   Drug use: No   Sexual activity: Yes    Partners: Male    Birth control/protection: Surgical    Comment: hysterectomy    Allergies  Allergen Reactions   Latex Rash   Sulfamethoxazole-Trimethoprim Nausea Only     Outpatient Medications Prior to Visit  Medication Sig Dispense Refill   buPROPion (WELLBUTRIN XL) 300 MG 24 hr tablet Take 300 mg by mouth  daily.     CALCIUM & MAGNESIUM CARBONATES PO Take 1 tablet by mouth daily.     CALCIUM MAGNESIUM ZINC PO Take by mouth.     cetirizine (ZYRTEC) 10 MG tablet Take 10 mg by mouth daily.     CVS SALINE NOSE SPRAY NA Place 1 spray into the nose 2 (two) times daily as needed. Nasal congestion.     CYANOCOBALAMIN IJ Inject 1 vial as directed See admin instructions. Every three months. Patient had shot on 5/8.     CYANOCOBALAMIN PO Take 1 tablet by mouth daily.     denosumab (PROLIA) 60 MG/ML SOSY injection Inject 60 mg into the skin every 6 (six) months.     fexofenadine (ALLEGRA ODT) 30 MG disintegrating tablet Take 30 mg by mouth daily.     fluticasone (FLONASE) 50 MCG/ACT nasal spray Place 2 sprays into the nose as needed.       ipratropium (ATROVENT) 0.06 % nasal spray Place 2 sprays into both nostrils 3 (three) times daily as needed for rhinitis. 15 mL 12   nebivolol (BYSTOLIC) 5 MG tablet Take 5 mg by mouth daily.       omeprazole (PRILOSEC) 20 MG capsule Take 1 capsule (20 mg total) by mouth daily. 90 capsule 3   polyethylene glycol powder (GLYCOLAX/MIRALAX) 17 GM/SCOOP powder Take by mouth daily. 1 tablespoon     Probiotic Product (PROBIOTIC 10 ULTRA STRENGTH) CAPS Take by mouth.     Vitamin D, Ergocalciferol, (DRISDOL) 1.25 MG (50000 UNIT) CAPS capsule Take 1 capsule by mouth once a week.     fluconazole (DIFLUCAN) 200 MG tablet Take 200 mg by mouth daily.     moxifloxacin (AVELOX) 400 MG tablet Take 1 tablet (400 mg total) by mouth daily at 8 pm. 7 tablet 0   No facility-administered medications prior to visit.    Review of Systems  Constitutional:  Negative for chills, diaphoresis, fever, malaise/fatigue and weight loss.  HENT:  Negative for congestion.   Respiratory:  Positive for cough. Negative for hemoptysis, sputum production, shortness of breath and wheezing.   Cardiovascular:  Negative for chest pain, palpitations and leg swelling.     Objective:   Vitals:   01/29/23 1102   BP: 120/60  Pulse: 83  SpO2: 97%  Weight: 166 lb (75.3 kg)  Height: 4\' 10"  (1.473 m)   SpO2: 97 % O2 Device: None (Room air)  Physical Exam: General: Well-appearing, no acute distress HENT: [place], AT Eyes: EOMI, no scleral icterus Respiratory: Clear to auscultation bilaterally.  No crackles, wheezing or rales Cardiovascular: RRR, -M/R/G, no JVD Extremities:-Edema,-tenderness Neuro: AAO x4, CNII-XII grossly intact Psych: Normal mood, normal affect  Data Reviewed:  Imaging: CT Chest 09/16/22 - Centrilobular emphysema, mild  pleural parenchymal scarring in upper lobes with subpleural reticulations in RUL, possible related to radiation. Unchanged atelectasis/scarring in RML. Minimal atelectasis in bases bilaterally.   PFT: 07/16/11 FVC 3.29 (131%) FEV1 2.37 (134%) Ratio 72  TLC 121% DLCO 74%. No sig BD response Interpretation: No obstructive or restrictive defect. Borderline hyperinflation. Mildly reduced DLCO  10/23/22 FVC 2.56 (129%) FEV1 2.00 (138%) Ratio 78  TLC 104% DLCO 78% Interpretation: No obstructive or restrictive defect is present. Mildly reduced DLCO with improvement compared to 2012 PFTs.  Labs: CBC    Component Value Date/Time   WBC 4.3 01/21/2023 1344   RBC 4.58 01/21/2023 1344   HGB 14.5 01/21/2023 1344   HGB 12.9 06/30/2018 1122   HGB 12.7 06/18/2017 1257   HCT 42.6 01/21/2023 1344   HCT 36.7 06/18/2017 1257   PLT 348.0 01/21/2023 1344   PLT 318 06/30/2018 1122   PLT 318 06/18/2017 1257   MCV 92.9 01/21/2023 1344   MCV 90.9 06/18/2017 1257   MCH 30.6 06/19/2019 1455   MCHC 33.9 01/21/2023 1344   RDW 13.0 01/21/2023 1344   RDW 13.4 06/18/2017 1257   LYMPHSABS 1.2 01/21/2023 1344   LYMPHSABS 1.5 06/18/2017 1257   MONOABS 0.3 01/21/2023 1344   MONOABS 0.4 06/18/2017 1257   EOSABS 0.1 01/21/2023 1344   EOSABS 0.1 06/18/2017 1257   BASOSABS 0.0 01/21/2023 1344   BASOSABS 0.0 06/18/2017 1257   Abs eos 01/20/20 -200      Assessment & Plan:    Discussion: 82 year old female with allergic rhinitis, hx right breast cancer s/p lumpectomy/chemo/radiation/HRT, osteopenia, depression who presents for cough. Improved cough with minimal intervention. Focused on UACS and reflux. Given improvement with time this may still represent post-viral process. PFTs neg for obstructive defect.  Her cough could have multiple contributors with prior pneumonias including covid in September which has led to prolonged respiratory symptoms. Overall improving. Emphysema noted on CT however with no obstructive defect on PFTs, not clinically significant and not responsive to bronchodilators. May consider trial of inhalers again during acute illness.  Breztri - thrush  Chronic cough: Post-viral vs upper airway cough vs reflux --CONTINUE flonase in morning --CONTINUE atrovent in evening. Ok to use midday as needed --Museum/gallery curator daily --CONTINUE omeprazole 20 mg daily   Emphysema, mild --Reviewed PFTs. Normal function  Health Maintenance Immunization History  Administered Date(s) Administered   Hepatitis A 04/11/2004   Hepatitis B 04/11/2004   IPV 03/11/2004   Influenza Split 09/12/2010, 08/12/2011   Influenza, High Dose Seasonal PF 08/16/2018   Moderna Sars-Covid-2 Vaccination 11/22/2019, 12/23/2019   Pneumococcal Conjugate-13 11/12/1995   Respiratory Syncytial Virus Vaccine,Recomb Aduvanted(Arexvy) 09/30/2022   Td 04/12/1995   Tdap 04/11/2004   Zoster Recombinat (Shingrix) 12/25/2017, 02/24/2018   Zoster, Live 11/12/1995   CT Lung Screen - not qualified. Quit >15 years ago  No orders of the defined types were placed in this encounter.  No orders of the defined types were placed in this encounter.   Return in about 6 months (around 08/01/2023) for routine follow-up, with Dr. Loanne Drilling.   I have spent a total time of 33-minutes on the day of the appointment including chart review, data review, collecting history, coordinating care and  discussing medical diagnosis and plan with the patient/family. Past medical history, allergies, medications were reviewed. Pertinent imaging, labs and tests included in this note have been reviewed and interpreted independently by me.  [name] Rodman Pickle, MD [facility] 01/29/2023 11:38 AM  Office Number  [phone]   

## 2023-01-29 NOTE — Patient Instructions (Signed)
Chronic cough: Post-viral vs upper airway cough vs reflux --CONTINUE flonase in morning --CONTINUE atrovent in evening. Ok to use midday as needed --Museum/gallery curator daily --CONTINUE omeprazole 20 mg daily --CONTINUE Albuterol AS NEEDED shortness of breath or cough

## 2023-02-04 ENCOUNTER — Encounter (HOSPITAL_BASED_OUTPATIENT_CLINIC_OR_DEPARTMENT_OTHER): Payer: Self-pay | Admitting: Pulmonary Disease

## 2023-02-19 DIAGNOSIS — L308 Other specified dermatitis: Secondary | ICD-10-CM | POA: Diagnosis not present

## 2023-02-19 DIAGNOSIS — W57XXXA Bitten or stung by nonvenomous insect and other nonvenomous arthropods, initial encounter: Secondary | ICD-10-CM | POA: Diagnosis not present

## 2023-02-19 DIAGNOSIS — E538 Deficiency of other specified B group vitamins: Secondary | ICD-10-CM | POA: Diagnosis not present

## 2023-02-19 DIAGNOSIS — Z85828 Personal history of other malignant neoplasm of skin: Secondary | ICD-10-CM | POA: Diagnosis not present

## 2023-02-19 DIAGNOSIS — L282 Other prurigo: Secondary | ICD-10-CM | POA: Diagnosis not present

## 2023-02-24 ENCOUNTER — Other Ambulatory Visit (HOSPITAL_COMMUNITY): Payer: Self-pay | Admitting: *Deleted

## 2023-02-26 ENCOUNTER — Encounter (HOSPITAL_COMMUNITY): Payer: PPO

## 2023-02-27 ENCOUNTER — Ambulatory Visit (HOSPITAL_COMMUNITY)
Admission: RE | Admit: 2023-02-27 | Discharge: 2023-02-27 | Disposition: A | Discharge status: Home / Self Care | Payer: PPO | Source: Ambulatory Visit | Attending: Internal Medicine | Admitting: Internal Medicine

## 2023-02-27 DIAGNOSIS — M810 Age-related osteoporosis without current pathological fracture: Secondary | ICD-10-CM | POA: Diagnosis not present

## 2023-02-27 MED ORDER — DENOSUMAB 60 MG/ML ~~LOC~~ SOSY
60.0000 mg | PREFILLED_SYRINGE | Freq: Once | SUBCUTANEOUS | Status: AC
  Administered 2023-02-27: 60 mg via SUBCUTANEOUS

## 2023-04-21 DIAGNOSIS — M25551 Pain in right hip: Secondary | ICD-10-CM | POA: Diagnosis not present

## 2023-04-21 DIAGNOSIS — I10 Essential (primary) hypertension: Secondary | ICD-10-CM | POA: Diagnosis not present

## 2023-04-24 DIAGNOSIS — M25562 Pain in left knee: Secondary | ICD-10-CM | POA: Diagnosis not present

## 2023-04-24 DIAGNOSIS — M170 Bilateral primary osteoarthritis of knee: Secondary | ICD-10-CM | POA: Diagnosis not present

## 2023-04-24 DIAGNOSIS — M7632 Iliotibial band syndrome, left leg: Secondary | ICD-10-CM | POA: Diagnosis not present

## 2023-05-01 DIAGNOSIS — H524 Presbyopia: Secondary | ICD-10-CM | POA: Diagnosis not present

## 2023-05-01 DIAGNOSIS — M7632 Iliotibial band syndrome, left leg: Secondary | ICD-10-CM | POA: Diagnosis not present

## 2023-05-01 DIAGNOSIS — H5213 Myopia, bilateral: Secondary | ICD-10-CM | POA: Diagnosis not present

## 2023-05-01 DIAGNOSIS — M170 Bilateral primary osteoarthritis of knee: Secondary | ICD-10-CM | POA: Diagnosis not present

## 2023-05-01 DIAGNOSIS — M2141 Flat foot [pes planus] (acquired), right foot: Secondary | ICD-10-CM | POA: Diagnosis not present

## 2023-05-01 DIAGNOSIS — Z961 Presence of intraocular lens: Secondary | ICD-10-CM | POA: Diagnosis not present

## 2023-05-01 DIAGNOSIS — M2142 Flat foot [pes planus] (acquired), left foot: Secondary | ICD-10-CM | POA: Diagnosis not present

## 2023-05-01 DIAGNOSIS — H52203 Unspecified astigmatism, bilateral: Secondary | ICD-10-CM | POA: Diagnosis not present

## 2023-05-12 DIAGNOSIS — D1801 Hemangioma of skin and subcutaneous tissue: Secondary | ICD-10-CM | POA: Diagnosis not present

## 2023-05-12 DIAGNOSIS — L570 Actinic keratosis: Secondary | ICD-10-CM | POA: Diagnosis not present

## 2023-05-12 DIAGNOSIS — L814 Other melanin hyperpigmentation: Secondary | ICD-10-CM | POA: Diagnosis not present

## 2023-05-12 DIAGNOSIS — D224 Melanocytic nevi of scalp and neck: Secondary | ICD-10-CM | POA: Diagnosis not present

## 2023-05-12 DIAGNOSIS — D225 Melanocytic nevi of trunk: Secondary | ICD-10-CM | POA: Diagnosis not present

## 2023-05-12 DIAGNOSIS — D692 Other nonthrombocytopenic purpura: Secondary | ICD-10-CM | POA: Diagnosis not present

## 2023-05-12 DIAGNOSIS — Z85828 Personal history of other malignant neoplasm of skin: Secondary | ICD-10-CM | POA: Diagnosis not present

## 2023-05-13 DIAGNOSIS — R2689 Other abnormalities of gait and mobility: Secondary | ICD-10-CM | POA: Diagnosis not present

## 2023-05-13 DIAGNOSIS — M7631 Iliotibial band syndrome, right leg: Secondary | ICD-10-CM | POA: Diagnosis not present

## 2023-05-13 DIAGNOSIS — M7632 Iliotibial band syndrome, left leg: Secondary | ICD-10-CM | POA: Diagnosis not present

## 2023-05-13 DIAGNOSIS — M6259 Muscle wasting and atrophy, not elsewhere classified, multiple sites: Secondary | ICD-10-CM | POA: Diagnosis not present

## 2023-05-14 DIAGNOSIS — M6259 Muscle wasting and atrophy, not elsewhere classified, multiple sites: Secondary | ICD-10-CM | POA: Diagnosis not present

## 2023-05-14 DIAGNOSIS — R2689 Other abnormalities of gait and mobility: Secondary | ICD-10-CM | POA: Diagnosis not present

## 2023-05-14 DIAGNOSIS — M7632 Iliotibial band syndrome, left leg: Secondary | ICD-10-CM | POA: Diagnosis not present

## 2023-05-14 DIAGNOSIS — M7631 Iliotibial band syndrome, right leg: Secondary | ICD-10-CM | POA: Diagnosis not present

## 2023-05-15 DIAGNOSIS — M6259 Muscle wasting and atrophy, not elsewhere classified, multiple sites: Secondary | ICD-10-CM | POA: Diagnosis not present

## 2023-05-15 DIAGNOSIS — R2689 Other abnormalities of gait and mobility: Secondary | ICD-10-CM | POA: Diagnosis not present

## 2023-05-15 DIAGNOSIS — M7632 Iliotibial band syndrome, left leg: Secondary | ICD-10-CM | POA: Diagnosis not present

## 2023-05-15 DIAGNOSIS — M7631 Iliotibial band syndrome, right leg: Secondary | ICD-10-CM | POA: Diagnosis not present

## 2023-05-16 DIAGNOSIS — M7631 Iliotibial band syndrome, right leg: Secondary | ICD-10-CM | POA: Diagnosis not present

## 2023-05-16 DIAGNOSIS — M6259 Muscle wasting and atrophy, not elsewhere classified, multiple sites: Secondary | ICD-10-CM | POA: Diagnosis not present

## 2023-05-16 DIAGNOSIS — M7632 Iliotibial band syndrome, left leg: Secondary | ICD-10-CM | POA: Diagnosis not present

## 2023-05-16 DIAGNOSIS — R2689 Other abnormalities of gait and mobility: Secondary | ICD-10-CM | POA: Diagnosis not present

## 2023-05-20 DIAGNOSIS — M7631 Iliotibial band syndrome, right leg: Secondary | ICD-10-CM | POA: Diagnosis not present

## 2023-05-20 DIAGNOSIS — R2689 Other abnormalities of gait and mobility: Secondary | ICD-10-CM | POA: Diagnosis not present

## 2023-05-20 DIAGNOSIS — M7632 Iliotibial band syndrome, left leg: Secondary | ICD-10-CM | POA: Diagnosis not present

## 2023-05-20 DIAGNOSIS — M6259 Muscle wasting and atrophy, not elsewhere classified, multiple sites: Secondary | ICD-10-CM | POA: Diagnosis not present

## 2023-05-22 DIAGNOSIS — N3000 Acute cystitis without hematuria: Secondary | ICD-10-CM | POA: Diagnosis not present

## 2023-05-24 DIAGNOSIS — M6259 Muscle wasting and atrophy, not elsewhere classified, multiple sites: Secondary | ICD-10-CM | POA: Diagnosis not present

## 2023-05-24 DIAGNOSIS — M7632 Iliotibial band syndrome, left leg: Secondary | ICD-10-CM | POA: Diagnosis not present

## 2023-05-24 DIAGNOSIS — M7631 Iliotibial band syndrome, right leg: Secondary | ICD-10-CM | POA: Diagnosis not present

## 2023-05-24 DIAGNOSIS — R2689 Other abnormalities of gait and mobility: Secondary | ICD-10-CM | POA: Diagnosis not present

## 2023-05-26 DIAGNOSIS — R2689 Other abnormalities of gait and mobility: Secondary | ICD-10-CM | POA: Diagnosis not present

## 2023-05-26 DIAGNOSIS — M7631 Iliotibial band syndrome, right leg: Secondary | ICD-10-CM | POA: Diagnosis not present

## 2023-05-26 DIAGNOSIS — M6259 Muscle wasting and atrophy, not elsewhere classified, multiple sites: Secondary | ICD-10-CM | POA: Diagnosis not present

## 2023-05-26 DIAGNOSIS — M7632 Iliotibial band syndrome, left leg: Secondary | ICD-10-CM | POA: Diagnosis not present

## 2023-05-27 DIAGNOSIS — M7631 Iliotibial band syndrome, right leg: Secondary | ICD-10-CM | POA: Diagnosis not present

## 2023-05-27 DIAGNOSIS — M6259 Muscle wasting and atrophy, not elsewhere classified, multiple sites: Secondary | ICD-10-CM | POA: Diagnosis not present

## 2023-05-27 DIAGNOSIS — M7632 Iliotibial band syndrome, left leg: Secondary | ICD-10-CM | POA: Diagnosis not present

## 2023-05-27 DIAGNOSIS — R2689 Other abnormalities of gait and mobility: Secondary | ICD-10-CM | POA: Diagnosis not present

## 2023-05-30 DIAGNOSIS — R2689 Other abnormalities of gait and mobility: Secondary | ICD-10-CM | POA: Diagnosis not present

## 2023-05-30 DIAGNOSIS — M7631 Iliotibial band syndrome, right leg: Secondary | ICD-10-CM | POA: Diagnosis not present

## 2023-05-30 DIAGNOSIS — M6259 Muscle wasting and atrophy, not elsewhere classified, multiple sites: Secondary | ICD-10-CM | POA: Diagnosis not present

## 2023-05-30 DIAGNOSIS — M7632 Iliotibial band syndrome, left leg: Secondary | ICD-10-CM | POA: Diagnosis not present

## 2023-06-02 DIAGNOSIS — M7632 Iliotibial band syndrome, left leg: Secondary | ICD-10-CM | POA: Diagnosis not present

## 2023-06-02 DIAGNOSIS — M6259 Muscle wasting and atrophy, not elsewhere classified, multiple sites: Secondary | ICD-10-CM | POA: Diagnosis not present

## 2023-06-02 DIAGNOSIS — R2689 Other abnormalities of gait and mobility: Secondary | ICD-10-CM | POA: Diagnosis not present

## 2023-06-02 DIAGNOSIS — M7631 Iliotibial band syndrome, right leg: Secondary | ICD-10-CM | POA: Diagnosis not present

## 2023-06-03 DIAGNOSIS — M2142 Flat foot [pes planus] (acquired), left foot: Secondary | ICD-10-CM | POA: Diagnosis not present

## 2023-06-03 DIAGNOSIS — M2141 Flat foot [pes planus] (acquired), right foot: Secondary | ICD-10-CM | POA: Diagnosis not present

## 2023-06-04 DIAGNOSIS — M7631 Iliotibial band syndrome, right leg: Secondary | ICD-10-CM | POA: Diagnosis not present

## 2023-06-04 DIAGNOSIS — M7632 Iliotibial band syndrome, left leg: Secondary | ICD-10-CM | POA: Diagnosis not present

## 2023-06-04 DIAGNOSIS — M6259 Muscle wasting and atrophy, not elsewhere classified, multiple sites: Secondary | ICD-10-CM | POA: Diagnosis not present

## 2023-06-04 DIAGNOSIS — R2689 Other abnormalities of gait and mobility: Secondary | ICD-10-CM | POA: Diagnosis not present

## 2023-06-06 DIAGNOSIS — R2689 Other abnormalities of gait and mobility: Secondary | ICD-10-CM | POA: Diagnosis not present

## 2023-06-06 DIAGNOSIS — M7631 Iliotibial band syndrome, right leg: Secondary | ICD-10-CM | POA: Diagnosis not present

## 2023-06-06 DIAGNOSIS — M7632 Iliotibial band syndrome, left leg: Secondary | ICD-10-CM | POA: Diagnosis not present

## 2023-06-06 DIAGNOSIS — M6259 Muscle wasting and atrophy, not elsewhere classified, multiple sites: Secondary | ICD-10-CM | POA: Diagnosis not present

## 2023-06-12 DIAGNOSIS — M7632 Iliotibial band syndrome, left leg: Secondary | ICD-10-CM | POA: Diagnosis not present

## 2023-06-12 DIAGNOSIS — M7631 Iliotibial band syndrome, right leg: Secondary | ICD-10-CM | POA: Diagnosis not present

## 2023-06-12 DIAGNOSIS — M6259 Muscle wasting and atrophy, not elsewhere classified, multiple sites: Secondary | ICD-10-CM | POA: Diagnosis not present

## 2023-06-12 DIAGNOSIS — R2689 Other abnormalities of gait and mobility: Secondary | ICD-10-CM | POA: Diagnosis not present

## 2023-06-13 ENCOUNTER — Ambulatory Visit: Payer: PPO | Admitting: Radiology

## 2023-06-13 ENCOUNTER — Encounter: Payer: Self-pay | Admitting: Radiology

## 2023-06-13 VITALS — BP 136/82 | Ht 59.0 in | Wt 168.0 lb

## 2023-06-13 DIAGNOSIS — Z01419 Encounter for gynecological examination (general) (routine) without abnormal findings: Secondary | ICD-10-CM | POA: Diagnosis not present

## 2023-06-13 NOTE — Progress Notes (Signed)
   [name] Mar 09, 1941 [id number]   History:  82 y.o. G2P2 presents for annual exam. Struggling with a tight right hip flexor. S/p TAH/BSO fibroids. Breast cancer 2011. Mammogram overdue  Gynecologic History Last Pap: 2018. Results were: normal Last mammogram: 2021.  Last colonoscopy: 2022 Last Dexa: with PCP   Past medical history, past surgical history, family history and social history were all reviewed and documented in the EPIC chart.  ROS:  A ROS was performed and pertinent positives and negatives are included.  Exam:  Vitals:   06/13/23 1325  BP: 136/82  Weight: 168 lb (76.2 kg)  Height: 4\' 11"  (1.499 m)   Body mass index is 33.93 kg/m.  General appearance:  Normal Thyroid:  Symmetrical, normal in size, without palpable masses or nodularity. Respiratory  Auscultation:  Clear without wheezing or rhonchi Cardiovascular  Auscultation:  Regular rate, without rubs, murmurs or gallops  Edema/varicosities:  Not grossly evident Abdominal  Soft,nontender, without masses, guarding or rebound.  Liver/spleen:  No organomegaly noted  Hernia:  None appreciated  Skin  Inspection:  Grossly normal Breasts: Examined lying and sitting.   Right: Without masses, retractions, nipple discharge or axillary adenopathy.   Left: Without masses, retractions, nipple discharge or axillary adenopathy. Genitourinary   Inguinal/mons:  Normal without inguinal adenopathy  External genitalia:  Normal appearing vulva with no masses, tenderness, or lesions  BUS/Urethra/Skene's glands:  Normal  Vagina:  Normal appearing with normal color and discharge, no lesions. Atrophy mild  Cervix:  absent  Uterus:  absent  Adnexa/parametria:  absent, no masses, tight hip flexor appreciated on exam  Anus and perineum: Normal   Raynelle Fanning, CMA present for exam  Assessment/Plan:   1. Well woman exam with routine gynecological exam    Discussed SBE, colonoscopy and DEXA screening as appropriate.  Encouraged 151mins/week of cardiovascular and weight bearing exercise minimum. Recommend the use of seatbelts and sunscreen consistently.   Return in 1 year for annual or sooner prn.  Arlie Solomons B WHNP-BC 1:49 PM 06/13/2023

## 2023-06-16 DIAGNOSIS — M7631 Iliotibial band syndrome, right leg: Secondary | ICD-10-CM | POA: Diagnosis not present

## 2023-06-16 DIAGNOSIS — R2689 Other abnormalities of gait and mobility: Secondary | ICD-10-CM | POA: Diagnosis not present

## 2023-06-16 DIAGNOSIS — M7632 Iliotibial band syndrome, left leg: Secondary | ICD-10-CM | POA: Diagnosis not present

## 2023-06-16 DIAGNOSIS — M6259 Muscle wasting and atrophy, not elsewhere classified, multiple sites: Secondary | ICD-10-CM | POA: Diagnosis not present

## 2023-06-19 DIAGNOSIS — M7631 Iliotibial band syndrome, right leg: Secondary | ICD-10-CM | POA: Diagnosis not present

## 2023-06-19 DIAGNOSIS — M7632 Iliotibial band syndrome, left leg: Secondary | ICD-10-CM | POA: Diagnosis not present

## 2023-06-19 DIAGNOSIS — R2689 Other abnormalities of gait and mobility: Secondary | ICD-10-CM | POA: Diagnosis not present

## 2023-06-19 DIAGNOSIS — M6259 Muscle wasting and atrophy, not elsewhere classified, multiple sites: Secondary | ICD-10-CM | POA: Diagnosis not present

## 2023-06-20 DIAGNOSIS — M7632 Iliotibial band syndrome, left leg: Secondary | ICD-10-CM | POA: Diagnosis not present

## 2023-06-20 DIAGNOSIS — R2689 Other abnormalities of gait and mobility: Secondary | ICD-10-CM | POA: Diagnosis not present

## 2023-06-20 DIAGNOSIS — M7631 Iliotibial band syndrome, right leg: Secondary | ICD-10-CM | POA: Diagnosis not present

## 2023-06-20 DIAGNOSIS — M6259 Muscle wasting and atrophy, not elsewhere classified, multiple sites: Secondary | ICD-10-CM | POA: Diagnosis not present

## 2023-06-21 DIAGNOSIS — M7631 Iliotibial band syndrome, right leg: Secondary | ICD-10-CM | POA: Diagnosis not present

## 2023-06-21 DIAGNOSIS — M7632 Iliotibial band syndrome, left leg: Secondary | ICD-10-CM | POA: Diagnosis not present

## 2023-06-21 DIAGNOSIS — M6259 Muscle wasting and atrophy, not elsewhere classified, multiple sites: Secondary | ICD-10-CM | POA: Diagnosis not present

## 2023-06-21 DIAGNOSIS — R2689 Other abnormalities of gait and mobility: Secondary | ICD-10-CM | POA: Diagnosis not present

## 2023-06-25 DIAGNOSIS — R2689 Other abnormalities of gait and mobility: Secondary | ICD-10-CM | POA: Diagnosis not present

## 2023-06-25 DIAGNOSIS — H903 Sensorineural hearing loss, bilateral: Secondary | ICD-10-CM | POA: Diagnosis not present

## 2023-06-25 DIAGNOSIS — M7632 Iliotibial band syndrome, left leg: Secondary | ICD-10-CM | POA: Diagnosis not present

## 2023-06-25 DIAGNOSIS — M7631 Iliotibial band syndrome, right leg: Secondary | ICD-10-CM | POA: Diagnosis not present

## 2023-06-25 DIAGNOSIS — M6259 Muscle wasting and atrophy, not elsewhere classified, multiple sites: Secondary | ICD-10-CM | POA: Diagnosis not present

## 2023-06-26 DIAGNOSIS — Z9889 Other specified postprocedural states: Secondary | ICD-10-CM | POA: Diagnosis not present

## 2023-06-26 DIAGNOSIS — Z923 Personal history of irradiation: Secondary | ICD-10-CM | POA: Diagnosis not present

## 2023-06-26 DIAGNOSIS — Z1239 Encounter for other screening for malignant neoplasm of breast: Secondary | ICD-10-CM | POA: Diagnosis not present

## 2023-06-26 DIAGNOSIS — M6259 Muscle wasting and atrophy, not elsewhere classified, multiple sites: Secondary | ICD-10-CM | POA: Diagnosis not present

## 2023-06-26 DIAGNOSIS — R92323 Mammographic fibroglandular density, bilateral breasts: Secondary | ICD-10-CM | POA: Diagnosis not present

## 2023-06-26 DIAGNOSIS — Z853 Personal history of malignant neoplasm of breast: Secondary | ICD-10-CM | POA: Diagnosis not present

## 2023-06-26 DIAGNOSIS — M7631 Iliotibial band syndrome, right leg: Secondary | ICD-10-CM | POA: Diagnosis not present

## 2023-06-26 DIAGNOSIS — M7632 Iliotibial band syndrome, left leg: Secondary | ICD-10-CM | POA: Diagnosis not present

## 2023-06-26 DIAGNOSIS — R2689 Other abnormalities of gait and mobility: Secondary | ICD-10-CM | POA: Diagnosis not present

## 2023-06-26 DIAGNOSIS — R928 Other abnormal and inconclusive findings on diagnostic imaging of breast: Secondary | ICD-10-CM | POA: Diagnosis not present

## 2023-06-27 DIAGNOSIS — M7072 Other bursitis of hip, left hip: Secondary | ICD-10-CM | POA: Diagnosis not present

## 2023-06-27 DIAGNOSIS — M25561 Pain in right knee: Secondary | ICD-10-CM | POA: Diagnosis not present

## 2023-06-27 DIAGNOSIS — M7071 Other bursitis of hip, right hip: Secondary | ICD-10-CM | POA: Diagnosis not present

## 2023-06-27 DIAGNOSIS — M25562 Pain in left knee: Secondary | ICD-10-CM | POA: Diagnosis not present

## 2023-06-30 DIAGNOSIS — M7631 Iliotibial band syndrome, right leg: Secondary | ICD-10-CM | POA: Diagnosis not present

## 2023-06-30 DIAGNOSIS — M6259 Muscle wasting and atrophy, not elsewhere classified, multiple sites: Secondary | ICD-10-CM | POA: Diagnosis not present

## 2023-06-30 DIAGNOSIS — M7632 Iliotibial band syndrome, left leg: Secondary | ICD-10-CM | POA: Diagnosis not present

## 2023-06-30 DIAGNOSIS — R2689 Other abnormalities of gait and mobility: Secondary | ICD-10-CM | POA: Diagnosis not present

## 2023-07-02 DIAGNOSIS — M25551 Pain in right hip: Secondary | ICD-10-CM | POA: Diagnosis not present

## 2023-07-02 DIAGNOSIS — M7632 Iliotibial band syndrome, left leg: Secondary | ICD-10-CM | POA: Diagnosis not present

## 2023-07-02 DIAGNOSIS — M25562 Pain in left knee: Secondary | ICD-10-CM | POA: Diagnosis not present

## 2023-07-02 DIAGNOSIS — M6259 Muscle wasting and atrophy, not elsewhere classified, multiple sites: Secondary | ICD-10-CM | POA: Diagnosis not present

## 2023-07-02 DIAGNOSIS — R2689 Other abnormalities of gait and mobility: Secondary | ICD-10-CM | POA: Diagnosis not present

## 2023-07-02 DIAGNOSIS — M7631 Iliotibial band syndrome, right leg: Secondary | ICD-10-CM | POA: Diagnosis not present

## 2023-07-08 DIAGNOSIS — M25552 Pain in left hip: Secondary | ICD-10-CM | POA: Diagnosis not present

## 2023-07-08 DIAGNOSIS — M25562 Pain in left knee: Secondary | ICD-10-CM | POA: Diagnosis not present

## 2023-07-10 DIAGNOSIS — M7632 Iliotibial band syndrome, left leg: Secondary | ICD-10-CM | POA: Diagnosis not present

## 2023-07-10 DIAGNOSIS — R2689 Other abnormalities of gait and mobility: Secondary | ICD-10-CM | POA: Diagnosis not present

## 2023-07-10 DIAGNOSIS — M6259 Muscle wasting and atrophy, not elsewhere classified, multiple sites: Secondary | ICD-10-CM | POA: Diagnosis not present

## 2023-07-10 DIAGNOSIS — M7631 Iliotibial band syndrome, right leg: Secondary | ICD-10-CM | POA: Diagnosis not present

## 2023-07-15 DIAGNOSIS — E781 Pure hyperglyceridemia: Secondary | ICD-10-CM | POA: Diagnosis not present

## 2023-07-15 DIAGNOSIS — R7989 Other specified abnormal findings of blood chemistry: Secondary | ICD-10-CM | POA: Diagnosis not present

## 2023-07-15 DIAGNOSIS — I10 Essential (primary) hypertension: Secondary | ICD-10-CM | POA: Diagnosis not present

## 2023-07-15 DIAGNOSIS — E538 Deficiency of other specified B group vitamins: Secondary | ICD-10-CM | POA: Diagnosis not present

## 2023-07-15 DIAGNOSIS — E559 Vitamin D deficiency, unspecified: Secondary | ICD-10-CM | POA: Diagnosis not present

## 2023-07-22 DIAGNOSIS — M542 Cervicalgia: Secondary | ICD-10-CM | POA: Diagnosis not present

## 2023-07-22 DIAGNOSIS — H9190 Unspecified hearing loss, unspecified ear: Secondary | ICD-10-CM | POA: Diagnosis not present

## 2023-07-22 DIAGNOSIS — I10 Essential (primary) hypertension: Secondary | ICD-10-CM | POA: Diagnosis not present

## 2023-07-22 DIAGNOSIS — R82998 Other abnormal findings in urine: Secondary | ICD-10-CM | POA: Diagnosis not present

## 2023-07-22 DIAGNOSIS — M8580 Other specified disorders of bone density and structure, unspecified site: Secondary | ICD-10-CM | POA: Diagnosis not present

## 2023-07-22 DIAGNOSIS — R1032 Left lower quadrant pain: Secondary | ICD-10-CM | POA: Diagnosis not present

## 2023-07-22 DIAGNOSIS — F329 Major depressive disorder, single episode, unspecified: Secondary | ICD-10-CM | POA: Diagnosis not present

## 2023-07-22 DIAGNOSIS — Z0000 Encounter for general adult medical examination without abnormal findings: Secondary | ICD-10-CM | POA: Diagnosis not present

## 2023-07-22 DIAGNOSIS — M792 Neuralgia and neuritis, unspecified: Secondary | ICD-10-CM | POA: Diagnosis not present

## 2023-07-22 DIAGNOSIS — E669 Obesity, unspecified: Secondary | ICD-10-CM | POA: Diagnosis not present

## 2023-07-22 DIAGNOSIS — M179 Osteoarthritis of knee, unspecified: Secondary | ICD-10-CM | POA: Diagnosis not present

## 2023-07-22 DIAGNOSIS — M5136 Other intervertebral disc degeneration, lumbar region: Secondary | ICD-10-CM | POA: Diagnosis not present

## 2023-07-22 DIAGNOSIS — E538 Deficiency of other specified B group vitamins: Secondary | ICD-10-CM | POA: Diagnosis not present

## 2023-07-30 ENCOUNTER — Other Ambulatory Visit: Payer: Self-pay | Admitting: General Surgery

## 2023-07-30 DIAGNOSIS — R1031 Right lower quadrant pain: Secondary | ICD-10-CM | POA: Diagnosis not present

## 2023-08-11 DIAGNOSIS — S83242D Other tear of medial meniscus, current injury, left knee, subsequent encounter: Secondary | ICD-10-CM | POA: Diagnosis not present

## 2023-08-11 DIAGNOSIS — M7602 Gluteal tendinitis, left hip: Secondary | ICD-10-CM | POA: Diagnosis not present

## 2023-08-11 DIAGNOSIS — R2689 Other abnormalities of gait and mobility: Secondary | ICD-10-CM | POA: Diagnosis not present

## 2023-08-11 DIAGNOSIS — M6259 Muscle wasting and atrophy, not elsewhere classified, multiple sites: Secondary | ICD-10-CM | POA: Diagnosis not present

## 2023-08-11 DIAGNOSIS — M25562 Pain in left knee: Secondary | ICD-10-CM | POA: Diagnosis not present

## 2023-08-11 DIAGNOSIS — M7631 Iliotibial band syndrome, right leg: Secondary | ICD-10-CM | POA: Diagnosis not present

## 2023-08-11 DIAGNOSIS — M7632 Iliotibial band syndrome, left leg: Secondary | ICD-10-CM | POA: Diagnosis not present

## 2023-08-11 DIAGNOSIS — R102 Pelvic and perineal pain: Secondary | ICD-10-CM | POA: Diagnosis not present

## 2023-08-13 ENCOUNTER — Ambulatory Visit
Admission: RE | Admit: 2023-08-13 | Discharge: 2023-08-13 | Disposition: A | Discharge status: Home / Self Care | Payer: PPO | Source: Ambulatory Visit | Attending: General Surgery | Admitting: General Surgery

## 2023-08-13 DIAGNOSIS — R1031 Right lower quadrant pain: Secondary | ICD-10-CM | POA: Diagnosis not present

## 2023-08-13 DIAGNOSIS — R1032 Left lower quadrant pain: Secondary | ICD-10-CM | POA: Diagnosis not present

## 2023-08-13 DIAGNOSIS — K5730 Diverticulosis of large intestine without perforation or abscess without bleeding: Secondary | ICD-10-CM | POA: Diagnosis not present

## 2023-08-13 DIAGNOSIS — I700 Atherosclerosis of aorta: Secondary | ICD-10-CM | POA: Diagnosis not present

## 2023-08-13 MED ORDER — IOPAMIDOL (ISOVUE-370) INJECTION 76%
500.0000 mL | Freq: Once | INTRAVENOUS | Status: AC | PRN
  Administered 2023-08-13: 75 mL via INTRAVENOUS

## 2023-08-15 ENCOUNTER — Encounter: Payer: Self-pay | Admitting: Physician Assistant

## 2023-08-15 ENCOUNTER — Ambulatory Visit: Payer: PPO | Admitting: Physician Assistant

## 2023-08-15 ENCOUNTER — Telehealth: Payer: Self-pay

## 2023-08-15 ENCOUNTER — Other Ambulatory Visit (INDEPENDENT_AMBULATORY_CARE_PROVIDER_SITE_OTHER): Payer: PPO

## 2023-08-15 VITALS — BP 130/76 | HR 93 | Ht 59.0 in | Wt 163.4 lb

## 2023-08-15 DIAGNOSIS — R519 Headache, unspecified: Secondary | ICD-10-CM

## 2023-08-15 DIAGNOSIS — H8110 Benign paroxysmal vertigo, unspecified ear: Secondary | ICD-10-CM | POA: Diagnosis not present

## 2023-08-15 DIAGNOSIS — R1032 Left lower quadrant pain: Secondary | ICD-10-CM

## 2023-08-15 DIAGNOSIS — K529 Noninfective gastroenteritis and colitis, unspecified: Secondary | ICD-10-CM

## 2023-08-15 DIAGNOSIS — R112 Nausea with vomiting, unspecified: Secondary | ICD-10-CM

## 2023-08-15 DIAGNOSIS — K5900 Constipation, unspecified: Secondary | ICD-10-CM

## 2023-08-15 DIAGNOSIS — R350 Frequency of micturition: Secondary | ICD-10-CM

## 2023-08-15 LAB — CBC WITH DIFFERENTIAL/PLATELET
Basophils Absolute: 0 10*3/uL (ref 0.0–0.1)
Basophils Relative: 0.4 % (ref 0.0–3.0)
Eosinophils Absolute: 0.3 10*3/uL (ref 0.0–0.7)
Eosinophils Relative: 4.1 % (ref 0.0–5.0)
HCT: 41.6 % (ref 36.0–46.0)
Hemoglobin: 13.6 g/dL (ref 12.0–15.0)
Lymphocytes Relative: 15.2 % (ref 12.0–46.0)
Lymphs Abs: 1.1 10*3/uL (ref 0.7–4.0)
MCHC: 32.7 g/dL (ref 30.0–36.0)
MCV: 94.6 fL (ref 78.0–100.0)
Monocytes Absolute: 0.7 10*3/uL (ref 0.1–1.0)
Monocytes Relative: 9 % (ref 3.0–12.0)
Neutro Abs: 5.4 10*3/uL (ref 1.4–7.7)
Neutrophils Relative %: 71.3 % (ref 43.0–77.0)
Platelets: 444 10*3/uL — ABNORMAL HIGH (ref 150.0–400.0)
RBC: 4.4 Mil/uL (ref 3.87–5.11)
RDW: 13.2 % (ref 11.5–15.5)
WBC: 7.5 10*3/uL (ref 4.0–10.5)

## 2023-08-15 LAB — URINALYSIS, ROUTINE W REFLEX MICROSCOPIC
Bilirubin Urine: NEGATIVE
Hgb urine dipstick: NEGATIVE
Ketones, ur: NEGATIVE
Leukocytes,Ua: NEGATIVE
Nitrite: NEGATIVE
RBC / HPF: NONE SEEN (ref 0–?)
Specific Gravity, Urine: 1.015 (ref 1.000–1.030)
Total Protein, Urine: NEGATIVE
Urine Glucose: NEGATIVE
Urobilinogen, UA: 0.2 (ref 0.0–1.0)
WBC, UA: NONE SEEN (ref 0–?)
pH: 6.5 (ref 5.0–8.0)

## 2023-08-15 LAB — COMPREHENSIVE METABOLIC PANEL
ALT: 16 U/L (ref 0–35)
AST: 20 U/L (ref 0–37)
Albumin: 4 g/dL (ref 3.5–5.2)
Alkaline Phosphatase: 85 U/L (ref 39–117)
BUN: 11 mg/dL (ref 6–23)
CO2: 27 meq/L (ref 19–32)
Calcium: 9.3 mg/dL (ref 8.4–10.5)
Chloride: 96 meq/L (ref 96–112)
Creatinine, Ser: 0.79 mg/dL (ref 0.40–1.20)
GFR: 69.88 mL/min (ref >60.00)
Glucose, Bld: 94 mg/dL (ref 70–99)
Potassium: 4.5 meq/L (ref 3.5–5.1)
Sodium: 131 meq/L — ABNORMAL LOW (ref 135–145)
Total Bilirubin: 0.5 mg/dL (ref 0.2–1.2)
Total Protein: 7.3 g/dL (ref 6.0–8.3)

## 2023-08-15 LAB — SEDIMENTATION RATE: Sed Rate: 48 mm/h — ABNORMAL HIGH (ref 0–30)

## 2023-08-15 LAB — TSH: TSH: 3.57 u[IU]/mL (ref 0.35–5.50)

## 2023-08-15 MED ORDER — MESALAMINE 1.2 G PO TBEC: 2.4000 g | DELAYED_RELEASE_TABLET | Freq: Every day | ORAL | 3 refills | Status: DC

## 2023-08-15 MED ORDER — HYOSCYAMINE SULFATE 0.125 MG PO TABS: 0.1250 mg | ORAL_TABLET | Freq: Four times a day (QID) | ORAL | 0 refills | Status: DC | PRN

## 2023-08-15 NOTE — Progress Notes (Signed)
08/15/2023 [name] [id number] 07/13/1941  Referring provider: Rodrigo Ran, MD Primary GI doctor: Dr. Rhea Belton  ASSESSMENT AND PLAN:   Diverticulosis history with left lower quadrant abdominal pain Patient had multiple episodes of diverticulitis last documented 1 was March, having left lower quadrant abdominal pain with recent CT 10/2 showing diverticulosis without diverticulitis.Currently improving with no complicating features.  -Continue low fiber diet and Miralax for mild constipation. -Start mesalamine (Lialda) 2.4 grams daily to reduce inflammation in the colon for likely scad seen on last colonoscopy -Follow-up 2 to 3 months with Dr. Rhea Belton. -Consider using Salonpas (topical lidocaine) for potential muscular pain. - IBS information given and IBGARD sampes  Generalized Malaise Reports of feeling unwell with symptoms of headaches, nausea, dizziness, and feeling shaky. No focal weakness or changes in vision or speech. -Order labs to investigate potential causes. -Consider increasing Prilosec to twice daily for 2-4 weeks if nausea persists. -Follow-up with primary care -ER precautions discussed  Urinary Frequency History of UTIs and currently on medication to prevent recurrence. -Check urine sample today to rule out current UTI.  Follow-up -Return to primary care or ER if symptoms worsen or new symptoms develop. -Follow up in 2 months as requested by Dr. Waynard Edwards.     Patient Care Team: Rodrigo Ran, MD as PCP - General (Internal Medicine) Bradly Bienenstock, MD as Consulting Physician (Orthopedic Surgery) [place], Amy, MD as Consulting Physician (Dermatology)  HISTORY OF PRESENT ILLNESS: 82 y.o. female with a past medical history of history of diverticular disease and recurrent diverticulitis, prior adenomatous polyps, history of ischemic colitis, prior breast cancer, chronic cough and others listed below presents for evaluation of LLQ AB pain.   Discussed the use of AI  scribe software for clinical note transcription with the patient, who gave verbal consent to proceed.  The patient reports a recent history of headaches, nausea, dizziness, and abdominal pain. The abdominal pain is described as sharp and located in the left lower quadrant.  The patient also reports feeling generally unwell, with symptoms of shakiness, dizziness, nausea, and headaches. The patient has been experiencing these symptoms since last Thursday.  She has not had any labs, CT abdomen pelvis showed no acute abnormality diverticulosis without diverticulitis. Denies melena, hematochezia, fevers or chills. She states headaches have been worse in the morning left-sided no blurry vision.  She has had bouts of vertigo.  Which could be contributing to the nausea.  Denies any sinus issues. The patient also reports a history of urinary tract infections (UTIs) and is currently on medication for the same.       She  reports that she quit smoking about 44 years ago. Her smoking use included cigarettes. She started smoking about 54 years ago. She has a 10 pack-year smoking history. She has never used smokeless tobacco. She reports current alcohol use. She reports that she does not use drugs.  RELEVANT LABS AND IMAGING:  Results   RADIOLOGY CT abdomen pelvis with contrast: No acute abdominal process, severe colonic diverticulosis, no active diverticulitis (08/13/2023)  DIAGNOSTIC Colonoscopy: Three polyps (3-6 mm) in ascending colon, severe diverticulosis, small internal hemorrhoids (07/18/2021)  PATHOLOGY Colonoscopy biopsy: Mildly active nonspecific colitis and scab (07/18/2021)      CBC    Component Value Date/Time   WBC 4.3 01/21/2023 1344   RBC 4.58 01/21/2023 1344   HGB 14.5 01/21/2023 1344   HGB 12.9 06/30/2018 1122   HGB 12.7 06/18/2017 1257   HCT 42.6 01/21/2023 1344   HCT  36.7 06/18/2017 1257   PLT 348.0 01/21/2023 1344   PLT 318 06/30/2018 1122   PLT 318 06/18/2017 1257    MCV 92.9 01/21/2023 1344   MCV 90.9 06/18/2017 1257   MCH 30.6 06/19/2019 1455   MCHC 33.9 01/21/2023 1344   RDW 13.0 01/21/2023 1344   RDW 13.4 06/18/2017 1257   LYMPHSABS 1.2 01/21/2023 1344   LYMPHSABS 1.5 06/18/2017 1257   MONOABS 0.3 01/21/2023 1344   MONOABS 0.4 06/18/2017 1257   EOSABS 0.1 01/21/2023 1344   EOSABS 0.1 06/18/2017 1257   BASOSABS 0.0 01/21/2023 1344   BASOSABS 0.0 06/18/2017 1257   Recent Labs    01/21/23 1344  HGB 14.5    CMP     Component Value Date/Time   NA 133 (L) 01/21/2023 1344   NA 133 (L) 06/18/2017 1257   K 4.2 01/21/2023 1344   K 3.9 06/18/2017 1257   CL 100 01/21/2023 1344   CO2 25 01/21/2023 1344   CO2 25 06/18/2017 1257   GLUCOSE 114 (H) 01/21/2023 1344   GLUCOSE 83 06/18/2017 1257   BUN 9 01/21/2023 1344   BUN 13.4 06/18/2017 1257   CREATININE 0.74 01/21/2023 1344   CREATININE 0.84 06/30/2018 1122   CREATININE 0.8 06/18/2017 1257   CALCIUM 9.6 01/21/2023 1344   CALCIUM 9.5 06/18/2017 1257   PROT 7.0 01/20/2023 1401   PROT 6.7 06/18/2017 1257   ALBUMIN 3.8 01/20/2023 1401   ALBUMIN 3.6 06/18/2017 1257   AST 20 01/20/2023 1401   AST 26 06/30/2018 1122   AST 26 06/18/2017 1257   ALT 15 01/20/2023 1401   ALT 31 06/30/2018 1122   ALT 23 06/18/2017 1257   ALKPHOS 73 01/20/2023 1401   ALKPHOS 66 06/18/2017 1257   BILITOT 0.5 01/20/2023 1401   BILITOT 0.4 06/30/2018 1122   BILITOT 0.47 06/18/2017 1257   GFRNONAA >60 06/19/2019 1455   GFRNONAA >60 06/30/2018 1122   GFRAA >60 06/19/2019 1455   GFRAA >60 06/30/2018 1122      Latest Ref Rng & Units 01/20/2023    2:01 PM 01/20/2020    2:13 PM 06/19/2019    2:55 PM  Hepatic Function  Total Protein 6.0 - 8.3 g/dL 7.0  6.9  7.1   Albumin 3.5 - 5.2 g/dL 3.8  3.8  3.8   AST 0 - 37 U/L 20  17  30    ALT 0 - 35 U/L 15  15  26    Alk Phosphatase 39 - 117 U/L 73  97  104   Total Bilirubin 0.2 - 1.2 mg/dL 0.5  0.3  0.5       Current Medications:   Current Outpatient Medications  (Endocrine & Metabolic):    denosumab (PROLIA) 60 MG/ML SOSY injection, Inject 60 mg into the skin every 6 (six) months.  Current Outpatient Medications (Cardiovascular):    nebivolol (BYSTOLIC) 5 MG tablet, Take 5 mg by mouth daily.    Current Outpatient Medications (Respiratory):    CVS SALINE NOSE SPRAY NA, Place 1 spray into the nose 2 (two) times daily as needed. Nasal congestion.   fexofenadine (ALLEGRA ODT) 30 MG disintegrating tablet, Take 30 mg by mouth daily.   fluticasone (FLONASE) 50 MCG/ACT nasal spray, Place 2 sprays into the nose as needed.     ipratropium (ATROVENT) 0.06 % nasal spray, Place 2 sprays into both nostrils 3 (three) times daily as needed for rhinitis.   cetirizine (ZYRTEC) 10 MG tablet, Take 10 mg by mouth daily. (Patient  not taking: Reported on 06/13/2023)  Current Outpatient Medications (Analgesics):    meloxicam (MOBIC) 15 MG tablet, Take 15 mg by mouth daily.  Current Outpatient Medications (Hematological):    CYANOCOBALAMIN IJ, Inject 1 vial as directed See admin instructions. Every three months. Patient had shot on 5/8.   CYANOCOBALAMIN PO, Take 1 tablet by mouth daily.  Current Outpatient Medications (Other):    buPROPion (WELLBUTRIN XL) 300 MG 24 hr tablet, Take 300 mg by mouth daily.   CALCIUM & MAGNESIUM CARBONATES PO, Take 1 tablet by mouth daily.   CALCIUM MAGNESIUM ZINC PO, Take by mouth.   CRANBERRY PO, Take by mouth.   hyoscyamine (LEVSIN) 0.125 MG tablet, Take 1 tablet (0.125 mg total) by mouth every 6 (six) hours as needed for cramping (diarrhea,nausea).   mesalamine (LIALDA) 1.2 g EC tablet, Take 2 tablets (2.4 g total) by mouth daily with breakfast.   methenamine (HIPREX) 1 g tablet, Take by mouth.   Multiple Vitamin (MULTIVITAMIN) capsule, Take 1 capsule by mouth daily.   omeprazole (PRILOSEC) 20 MG capsule, Take 1 capsule (20 mg total) by mouth daily.   polyethylene glycol powder (GLYCOLAX/MIRALAX) 17 GM/SCOOP powder, Take by mouth daily.  1 tablespoon   Probiotic Product (PROBIOTIC 10 ULTRA STRENGTH) CAPS, Take by mouth.   Vitamin D, Ergocalciferol, (DRISDOL) 1.25 MG (50000 UNIT) CAPS capsule, Take 1 capsule by mouth once a week.  Medical History:  Past Medical History:  Diagnosis Date   Allergic rhinitis    Allergy    Anemia    Anxiety    Asthma    Blood transfusion without reported diagnosis    Breast cancer ([facility]) 2011   right side   Cataract    bil removed   Concussion    COVID    Depression    Diverticulitis    Diverticulosis    Endometriosis    GERD (gastroesophageal reflux disease)    Hypertension    IT band syndrome    Neuromuscular disorder ([facility])    ociptal neuralgia   Osteopenia    Pneumonia    Tubular adenoma of colon    UTI (urinary tract infection)    Allergies:  Allergies  Allergen Reactions   Latex Rash   Sulfamethoxazole-Trimethoprim Nausea Only     Surgical History:  She  has a past surgical history that includes Lipoma excision (08/2002); Back surgery (09/1999); Nasal sinus surgery (1994); Total abdominal hysterectomy (1988); Appendectomy (1988); rectal fissure repair (11/1955); Breast lumpectomy (Right, 09/04/2010); Breast lumpectomy (8/41324); carpel tunnel (Right, last five years (04/19/15)); Trigger finger release; Cataract extraction w/ intraocular lens  implant, bilateral (Bilateral, 05/15/16 and 05/09/16); Colonoscopy; Occipital neurectomy; pvd (Right); Polypectomy; and Upper gastrointestinal endoscopy. Family History:  Her family history includes Heart attack in her father; Heart disease in her father and mother; Lung cancer in her father; Melanoma in her daughter.  REVIEW OF SYSTEMS  : All other systems reviewed and negative except where noted in the History of Present Illness.  PHYSICAL EXAM: BP 130/76   Pulse 93   Ht 4\' 11"  (1.499 m)   Wt 163 lb 6.4 oz (74.1 kg)   SpO2 98%   BMI 33.00 kg/m  General Appearance: Well nourished, in no apparent distress. Head:   Normocephalic  and atraumatic. Eyes:  sclerae anicteric,conjunctive pink  Respiratory: Respiratory effort normal, BS equal bilaterally without rales, rhonchi, wheezing. Cardio: RRR with no MRGs. Peripheral pulses intact.  Abdomen: Soft,  Obese ,active bowel sounds. mild tenderness in the lower abdomen.  Without guarding and Without rebound. No masses. Rectal: Not evaluated Musculoskeletal: Full ROM, Normal gait. Without edema. Skin:  Dry and intact without significant lesions or rashes Neuro: Alert and  oriented x4;  No focal deficits. Psych:  Cooperative. Normal mood and affect.    Doree Albee, PA-C 12:21 PM

## 2023-08-15 NOTE — Telephone Encounter (Signed)
Pt calling with left sided pain. Dr. Rhea Belton wanted to know if she could be seen today. Pt scheduled to see Quentin Mulling PA today at 11am. Pt aware of appt.

## 2023-08-15 NOTE — Patient Instructions (Addendum)
Your provider has requested that you go to the basement level for lab work before leaving today. Press "B" on the elevator. The lab is located at the first door on the left as you exit the elevator.  You are schedule for a follow up visit on 12/16/23 at 10:10 am with Dr Rhea Belton  First do a trial off milk/lactose products if you use them.  Add fiber like benefiber or citracel once a day Increase activity Can do trial of IBGard which is over the counter for AB pain- Take 1-2 capsules once a day for maintence or twice a day during a flare Can send in an anti spasm medication, Bentyl, to take as needed  Miralax is an osmotic laxative.  It only brings more water into the stool.  This is safe to take daily.  Can take up to 17 gram of miralax twice a day.  Mix with juice or coffee.  Start 1 capful at night for 3-4 days and reassess your response in 3-4 days.  You can increase and decrease the dose based on your response.  Remember, it can take up to 3-4 days to take effect OR for the effects to wear off.   I often pair this with benefiber in the morning to help assure the stool is not too loose.  No aleve, ibuprofen, goody powders, as these are antiinflammatories and can cause inflammation in your stomach, increase bleeding risk and cause ulcers.  You can talk with PCP about alternative pain options.  Can do tyelnol max 3000 mg a day, salon pas patches are over the counter and voltern gel is topical antiinflammatory that is safe.   Please take your proton pump inhibitor medication, increase prilosec to twice a day for 2-4 weeks, and then go back to once a day to heal stomach from the mobic  Please take this medication 30 minutes to 1 hour before meals- this makes it more effective.  Avoid spicy and acidic foods Avoid fatty foods Limit your intake of coffee, tea, alcohol, and carbonated drinks Work to maintain a healthy weight Keep the head of the bed elevated at least 3 inches with blocks or a  wedge pillow if you are having any nighttime symptoms Stay upright for 2 hours after eating Avoid meals and snacks three to four hours before bedtime  First do a trial off milk/lactose products if you use them.  Add fiber like benefiber or citracel once a day Increase activity Can do trial of IBGard which is over the counter for AB pain- Take 1-2 capsules once a day for maintence or twice a day during a flare Can send in an anti spasm medication, Levsin, to take as needed Please try to decrease stress. consider talking with PCP about anti anxiety medication or try head space app for meditation. if any worsening symptoms like blood in stool, weight loss, please call the office     FODMAP stands for fermentable oligo-, di-, mono-saccharides and polyols (1). These are the scientific terms used to classify groups of carbs that are difficult for our body to digest and that are notorious for triggering digestive symptoms like bloating, gas, loose stools and stomach pain.   You can try low FODMAP diet  - start with eliminating just one column at a time that you feel may be a trigger for you. - the table at the very bottom contains foods that are low in FODMAPs   Sometimes trying to eliminate the FODMAP's from your diet  is difficult or tricky, if you are stuggling with trying to do the elimination diet you can try an enzyme.  There is a food enzymes that you sprinkle in or on your food that helps break down the FODMAP. You can read more about the enzyme by going to this site: https://fodzyme.com/

## 2023-08-18 DIAGNOSIS — R102 Pelvic and perineal pain: Secondary | ICD-10-CM | POA: Diagnosis not present

## 2023-08-18 DIAGNOSIS — M7631 Iliotibial band syndrome, right leg: Secondary | ICD-10-CM | POA: Diagnosis not present

## 2023-08-18 DIAGNOSIS — M6259 Muscle wasting and atrophy, not elsewhere classified, multiple sites: Secondary | ICD-10-CM | POA: Diagnosis not present

## 2023-08-18 DIAGNOSIS — R2689 Other abnormalities of gait and mobility: Secondary | ICD-10-CM | POA: Diagnosis not present

## 2023-08-18 DIAGNOSIS — M7602 Gluteal tendinitis, left hip: Secondary | ICD-10-CM | POA: Diagnosis not present

## 2023-08-18 DIAGNOSIS — M7632 Iliotibial band syndrome, left leg: Secondary | ICD-10-CM | POA: Diagnosis not present

## 2023-08-18 DIAGNOSIS — M25562 Pain in left knee: Secondary | ICD-10-CM | POA: Diagnosis not present

## 2023-08-18 DIAGNOSIS — S83242D Other tear of medial meniscus, current injury, left knee, subsequent encounter: Secondary | ICD-10-CM | POA: Diagnosis not present

## 2023-08-19 DIAGNOSIS — R2689 Other abnormalities of gait and mobility: Secondary | ICD-10-CM | POA: Diagnosis not present

## 2023-08-19 DIAGNOSIS — S83242D Other tear of medial meniscus, current injury, left knee, subsequent encounter: Secondary | ICD-10-CM | POA: Diagnosis not present

## 2023-08-19 DIAGNOSIS — M7602 Gluteal tendinitis, left hip: Secondary | ICD-10-CM | POA: Diagnosis not present

## 2023-08-19 DIAGNOSIS — R102 Pelvic and perineal pain: Secondary | ICD-10-CM | POA: Diagnosis not present

## 2023-08-19 DIAGNOSIS — M6259 Muscle wasting and atrophy, not elsewhere classified, multiple sites: Secondary | ICD-10-CM | POA: Diagnosis not present

## 2023-08-19 DIAGNOSIS — M7632 Iliotibial band syndrome, left leg: Secondary | ICD-10-CM | POA: Diagnosis not present

## 2023-08-19 DIAGNOSIS — M7631 Iliotibial band syndrome, right leg: Secondary | ICD-10-CM | POA: Diagnosis not present

## 2023-08-19 DIAGNOSIS — M25562 Pain in left knee: Secondary | ICD-10-CM | POA: Diagnosis not present

## 2023-08-20 NOTE — Progress Notes (Signed)
Addendum: Reviewed and agree with assessment and management plan. Kadijah Shamoon M, MD  

## 2023-08-21 DIAGNOSIS — R2689 Other abnormalities of gait and mobility: Secondary | ICD-10-CM | POA: Diagnosis not present

## 2023-08-21 DIAGNOSIS — M7631 Iliotibial band syndrome, right leg: Secondary | ICD-10-CM | POA: Diagnosis not present

## 2023-08-21 DIAGNOSIS — M6259 Muscle wasting and atrophy, not elsewhere classified, multiple sites: Secondary | ICD-10-CM | POA: Diagnosis not present

## 2023-08-21 DIAGNOSIS — R102 Pelvic and perineal pain: Secondary | ICD-10-CM | POA: Diagnosis not present

## 2023-08-21 DIAGNOSIS — M25562 Pain in left knee: Secondary | ICD-10-CM | POA: Diagnosis not present

## 2023-08-21 DIAGNOSIS — M7602 Gluteal tendinitis, left hip: Secondary | ICD-10-CM | POA: Diagnosis not present

## 2023-08-21 DIAGNOSIS — M7632 Iliotibial band syndrome, left leg: Secondary | ICD-10-CM | POA: Diagnosis not present

## 2023-08-21 DIAGNOSIS — S83242D Other tear of medial meniscus, current injury, left knee, subsequent encounter: Secondary | ICD-10-CM | POA: Diagnosis not present

## 2023-08-23 ENCOUNTER — Other Ambulatory Visit: Payer: Self-pay | Admitting: Physician Assistant

## 2023-08-25 DIAGNOSIS — M7602 Gluteal tendinitis, left hip: Secondary | ICD-10-CM | POA: Diagnosis not present

## 2023-08-25 DIAGNOSIS — M7631 Iliotibial band syndrome, right leg: Secondary | ICD-10-CM | POA: Diagnosis not present

## 2023-08-25 DIAGNOSIS — M7632 Iliotibial band syndrome, left leg: Secondary | ICD-10-CM | POA: Diagnosis not present

## 2023-08-25 DIAGNOSIS — M25562 Pain in left knee: Secondary | ICD-10-CM | POA: Diagnosis not present

## 2023-08-25 DIAGNOSIS — R102 Pelvic and perineal pain: Secondary | ICD-10-CM | POA: Diagnosis not present

## 2023-08-25 DIAGNOSIS — M6259 Muscle wasting and atrophy, not elsewhere classified, multiple sites: Secondary | ICD-10-CM | POA: Diagnosis not present

## 2023-08-25 DIAGNOSIS — S83242D Other tear of medial meniscus, current injury, left knee, subsequent encounter: Secondary | ICD-10-CM | POA: Diagnosis not present

## 2023-08-25 DIAGNOSIS — R2689 Other abnormalities of gait and mobility: Secondary | ICD-10-CM | POA: Diagnosis not present

## 2023-08-27 DIAGNOSIS — M25551 Pain in right hip: Secondary | ICD-10-CM | POA: Diagnosis not present

## 2023-08-28 ENCOUNTER — Ambulatory Visit: Payer: PPO | Admitting: Radiology

## 2023-08-28 VITALS — BP 124/82 | Temp 98.1°F

## 2023-08-28 DIAGNOSIS — G8929 Other chronic pain: Secondary | ICD-10-CM

## 2023-08-28 DIAGNOSIS — R102 Pelvic and perineal pain: Secondary | ICD-10-CM | POA: Diagnosis not present

## 2023-08-28 NOTE — Progress Notes (Signed)
[name] [birth year] [id number]   History:  82 y.o. G2P2 complains of right sided pelvic pain x's 5 months. Symptoms worsening after returning from [place] and had been doing many stairs. Had CT of abdomen and pelvis 08/13/23. Saw [facility] yesterday and had right hip injection.  Struggling with a tight right hip flexor. S/p TAH/BSO fibroids. Breast cancer 2011.   Gynecologic History Last Pap: 2018. Results were: normal Last mammogram: 2021.  Last colonoscopy: 2022 Last Dexa: with PCP   Past medical history, past surgical history, family history and social history were all reviewed and documented in the EPIC chart.  ROS:  A ROS was performed and pertinent positives and negatives are included.  Exam:  Vitals:   08/28/23 0932  BP: 124/82  Temp: 98.1 F (36.7 C)  TempSrc: Oral   There is no height or weight on file to calculate BMI.  General appearance:  Normal Thyroid:  Symmetrical, normal in size, without palpable masses or nodularity. Respiratory  Auscultation:  Clear without wheezing or rhonchi Cardiovascular  Auscultation:  Regular rate, without rubs, murmurs or gallops  Edema/varicosities:  Not grossly evident Abdominal  Soft,without masses, guarding or rebound. +Tenderness over right psoas on deep palpation  Liver/spleen:  No organomegaly noted  Hernia:  None appreciated Genitourinary   Inguinal/mons:  Normal without inguinal adenopathy  External genitalia:  Normal appearing vulva with no masses, tenderness, or lesions  BUS/Urethra/Skene's glands:  Normal  Vagina:  Normal appearing with normal color and discharge, no lesions. Atrophy mild  Cervix:  absent  Uterus:  absent  Adnexa/parametria:  absent, no masses, tight hip flexor appreciated on exam     Raynelle Fanning, CMA present for exam  Assessment/Plan:   1. Pelvic pain - Urinalysis,Complete w/RFL Culture  2. Chronic RLQ pain Followed by Ortho Reassured not gyn related, based on palpation it is  coming from the psoas and hip flexor. Recommend following up with PT and massage therapy.  Tanda Rockers WHNP-BC 10:38 AM 08/28/2023

## 2023-08-30 LAB — URINALYSIS, COMPLETE W/RFL CULTURE
Bacteria, UA: NONE SEEN /[HPF]
Bilirubin Urine: NEGATIVE
Glucose, UA: NEGATIVE
Hyaline Cast: NONE SEEN /[LPF]
Ketones, ur: NEGATIVE
Leukocyte Esterase: NEGATIVE
Nitrites, Initial: NEGATIVE
Protein, ur: NEGATIVE
Specific Gravity, Urine: 1.015 (ref 1.001–1.035)
pH: 6.5 (ref 5.0–8.0)

## 2023-08-30 LAB — URINE CULTURE
MICRO NUMBER:: 15608043
Result:: NO GROWTH
SPECIMEN QUALITY:: ADEQUATE

## 2023-08-30 LAB — CULTURE INDICATED

## 2023-09-01 DIAGNOSIS — S83242D Other tear of medial meniscus, current injury, left knee, subsequent encounter: Secondary | ICD-10-CM | POA: Diagnosis not present

## 2023-09-01 DIAGNOSIS — M7632 Iliotibial band syndrome, left leg: Secondary | ICD-10-CM | POA: Diagnosis not present

## 2023-09-01 DIAGNOSIS — M7631 Iliotibial band syndrome, right leg: Secondary | ICD-10-CM | POA: Diagnosis not present

## 2023-09-01 DIAGNOSIS — M6259 Muscle wasting and atrophy, not elsewhere classified, multiple sites: Secondary | ICD-10-CM | POA: Diagnosis not present

## 2023-09-01 DIAGNOSIS — M7602 Gluteal tendinitis, left hip: Secondary | ICD-10-CM | POA: Diagnosis not present

## 2023-09-01 DIAGNOSIS — R102 Pelvic and perineal pain: Secondary | ICD-10-CM | POA: Diagnosis not present

## 2023-09-01 DIAGNOSIS — M25562 Pain in left knee: Secondary | ICD-10-CM | POA: Diagnosis not present

## 2023-09-01 DIAGNOSIS — R2689 Other abnormalities of gait and mobility: Secondary | ICD-10-CM | POA: Diagnosis not present

## 2023-09-04 ENCOUNTER — Other Ambulatory Visit (HOSPITAL_BASED_OUTPATIENT_CLINIC_OR_DEPARTMENT_OTHER): Payer: Self-pay

## 2023-09-04 ENCOUNTER — Encounter: Payer: Self-pay | Admitting: Oncology

## 2023-09-04 MED ORDER — FLUAD 0.5 ML IM SUSY
0.5000 mL | PREFILLED_SYRINGE | Freq: Once | INTRAMUSCULAR | 0 refills | Status: AC
  Filled 2023-09-04: qty 0.5, 1d supply, fill #0

## 2023-09-08 DIAGNOSIS — R102 Pelvic and perineal pain: Secondary | ICD-10-CM | POA: Diagnosis not present

## 2023-09-08 DIAGNOSIS — M7631 Iliotibial band syndrome, right leg: Secondary | ICD-10-CM | POA: Diagnosis not present

## 2023-09-08 DIAGNOSIS — R2689 Other abnormalities of gait and mobility: Secondary | ICD-10-CM | POA: Diagnosis not present

## 2023-09-08 DIAGNOSIS — M7602 Gluteal tendinitis, left hip: Secondary | ICD-10-CM | POA: Diagnosis not present

## 2023-09-08 DIAGNOSIS — M7632 Iliotibial band syndrome, left leg: Secondary | ICD-10-CM | POA: Diagnosis not present

## 2023-09-08 DIAGNOSIS — M6259 Muscle wasting and atrophy, not elsewhere classified, multiple sites: Secondary | ICD-10-CM | POA: Diagnosis not present

## 2023-09-08 DIAGNOSIS — S83242D Other tear of medial meniscus, current injury, left knee, subsequent encounter: Secondary | ICD-10-CM | POA: Diagnosis not present

## 2023-09-08 DIAGNOSIS — M25562 Pain in left knee: Secondary | ICD-10-CM | POA: Diagnosis not present

## 2023-09-11 DIAGNOSIS — S83242D Other tear of medial meniscus, current injury, left knee, subsequent encounter: Secondary | ICD-10-CM | POA: Diagnosis not present

## 2023-09-11 DIAGNOSIS — M25562 Pain in left knee: Secondary | ICD-10-CM | POA: Diagnosis not present

## 2023-09-11 DIAGNOSIS — R2689 Other abnormalities of gait and mobility: Secondary | ICD-10-CM | POA: Diagnosis not present

## 2023-09-11 DIAGNOSIS — M7632 Iliotibial band syndrome, left leg: Secondary | ICD-10-CM | POA: Diagnosis not present

## 2023-09-11 DIAGNOSIS — M6259 Muscle wasting and atrophy, not elsewhere classified, multiple sites: Secondary | ICD-10-CM | POA: Diagnosis not present

## 2023-09-11 DIAGNOSIS — R102 Pelvic and perineal pain: Secondary | ICD-10-CM | POA: Diagnosis not present

## 2023-09-11 DIAGNOSIS — M7602 Gluteal tendinitis, left hip: Secondary | ICD-10-CM | POA: Diagnosis not present

## 2023-09-11 DIAGNOSIS — M7631 Iliotibial band syndrome, right leg: Secondary | ICD-10-CM | POA: Diagnosis not present

## 2023-09-12 ENCOUNTER — Other Ambulatory Visit (HOSPITAL_COMMUNITY): Payer: Self-pay

## 2023-09-15 ENCOUNTER — Ambulatory Visit (HOSPITAL_COMMUNITY)
Admission: RE | Admit: 2023-09-15 | Discharge: 2023-09-15 | Disposition: A | Discharge status: Home / Self Care | Payer: PPO | Source: Ambulatory Visit | Attending: Internal Medicine | Admitting: Internal Medicine

## 2023-09-15 DIAGNOSIS — S83242D Other tear of medial meniscus, current injury, left knee, subsequent encounter: Secondary | ICD-10-CM | POA: Diagnosis not present

## 2023-09-15 DIAGNOSIS — M7631 Iliotibial band syndrome, right leg: Secondary | ICD-10-CM | POA: Diagnosis not present

## 2023-09-15 DIAGNOSIS — R2689 Other abnormalities of gait and mobility: Secondary | ICD-10-CM | POA: Diagnosis not present

## 2023-09-15 DIAGNOSIS — M7632 Iliotibial band syndrome, left leg: Secondary | ICD-10-CM | POA: Diagnosis not present

## 2023-09-15 DIAGNOSIS — M6259 Muscle wasting and atrophy, not elsewhere classified, multiple sites: Secondary | ICD-10-CM | POA: Diagnosis not present

## 2023-09-15 DIAGNOSIS — M7602 Gluteal tendinitis, left hip: Secondary | ICD-10-CM | POA: Diagnosis not present

## 2023-09-15 DIAGNOSIS — M25562 Pain in left knee: Secondary | ICD-10-CM | POA: Diagnosis not present

## 2023-09-15 DIAGNOSIS — M810 Age-related osteoporosis without current pathological fracture: Secondary | ICD-10-CM | POA: Diagnosis not present

## 2023-09-15 DIAGNOSIS — R102 Pelvic and perineal pain: Secondary | ICD-10-CM | POA: Diagnosis not present

## 2023-09-15 MED ORDER — DENOSUMAB 60 MG/ML ~~LOC~~ SOSY
60.0000 mg | PREFILLED_SYRINGE | Freq: Once | SUBCUTANEOUS | Status: AC
  Administered 2023-09-15: 60 mg via SUBCUTANEOUS

## 2023-09-15 MED ORDER — DENOSUMAB 60 MG/ML ~~LOC~~ SOSY
PREFILLED_SYRINGE | SUBCUTANEOUS | Status: AC
  Filled 2023-09-15: qty 1

## 2023-09-29 DIAGNOSIS — M7631 Iliotibial band syndrome, right leg: Secondary | ICD-10-CM | POA: Diagnosis not present

## 2023-09-29 DIAGNOSIS — M6259 Muscle wasting and atrophy, not elsewhere classified, multiple sites: Secondary | ICD-10-CM | POA: Diagnosis not present

## 2023-09-29 DIAGNOSIS — R102 Pelvic and perineal pain: Secondary | ICD-10-CM | POA: Diagnosis not present

## 2023-09-29 DIAGNOSIS — Z23 Encounter for immunization: Secondary | ICD-10-CM | POA: Diagnosis not present

## 2023-09-29 DIAGNOSIS — S83242D Other tear of medial meniscus, current injury, left knee, subsequent encounter: Secondary | ICD-10-CM | POA: Diagnosis not present

## 2023-09-29 DIAGNOSIS — M7632 Iliotibial band syndrome, left leg: Secondary | ICD-10-CM | POA: Diagnosis not present

## 2023-09-29 DIAGNOSIS — M25562 Pain in left knee: Secondary | ICD-10-CM | POA: Diagnosis not present

## 2023-09-29 DIAGNOSIS — M7602 Gluteal tendinitis, left hip: Secondary | ICD-10-CM | POA: Diagnosis not present

## 2023-09-29 DIAGNOSIS — M179 Osteoarthritis of knee, unspecified: Secondary | ICD-10-CM | POA: Diagnosis not present

## 2023-09-29 DIAGNOSIS — R2689 Other abnormalities of gait and mobility: Secondary | ICD-10-CM | POA: Diagnosis not present

## 2023-09-29 DIAGNOSIS — M1611 Unilateral primary osteoarthritis, right hip: Secondary | ICD-10-CM | POA: Diagnosis not present

## 2023-09-29 DIAGNOSIS — I10 Essential (primary) hypertension: Secondary | ICD-10-CM | POA: Diagnosis not present

## 2023-10-02 DIAGNOSIS — R102 Pelvic and perineal pain: Secondary | ICD-10-CM | POA: Diagnosis not present

## 2023-10-02 DIAGNOSIS — M7602 Gluteal tendinitis, left hip: Secondary | ICD-10-CM | POA: Diagnosis not present

## 2023-10-02 DIAGNOSIS — M6259 Muscle wasting and atrophy, not elsewhere classified, multiple sites: Secondary | ICD-10-CM | POA: Diagnosis not present

## 2023-10-02 DIAGNOSIS — S83242D Other tear of medial meniscus, current injury, left knee, subsequent encounter: Secondary | ICD-10-CM | POA: Diagnosis not present

## 2023-10-02 DIAGNOSIS — M25562 Pain in left knee: Secondary | ICD-10-CM | POA: Diagnosis not present

## 2023-10-02 DIAGNOSIS — M7631 Iliotibial band syndrome, right leg: Secondary | ICD-10-CM | POA: Diagnosis not present

## 2023-10-02 DIAGNOSIS — R2689 Other abnormalities of gait and mobility: Secondary | ICD-10-CM | POA: Diagnosis not present

## 2023-10-02 DIAGNOSIS — M7632 Iliotibial band syndrome, left leg: Secondary | ICD-10-CM | POA: Diagnosis not present

## 2023-10-11 DIAGNOSIS — S83242D Other tear of medial meniscus, current injury, left knee, subsequent encounter: Secondary | ICD-10-CM | POA: Diagnosis not present

## 2023-10-11 DIAGNOSIS — R102 Pelvic and perineal pain: Secondary | ICD-10-CM | POA: Diagnosis not present

## 2023-10-11 DIAGNOSIS — M7631 Iliotibial band syndrome, right leg: Secondary | ICD-10-CM | POA: Diagnosis not present

## 2023-10-11 DIAGNOSIS — M25562 Pain in left knee: Secondary | ICD-10-CM | POA: Diagnosis not present

## 2023-10-11 DIAGNOSIS — M7632 Iliotibial band syndrome, left leg: Secondary | ICD-10-CM | POA: Diagnosis not present

## 2023-10-11 DIAGNOSIS — M6259 Muscle wasting and atrophy, not elsewhere classified, multiple sites: Secondary | ICD-10-CM | POA: Diagnosis not present

## 2023-10-11 DIAGNOSIS — M7602 Gluteal tendinitis, left hip: Secondary | ICD-10-CM | POA: Diagnosis not present

## 2023-10-11 DIAGNOSIS — R2689 Other abnormalities of gait and mobility: Secondary | ICD-10-CM | POA: Diagnosis not present

## 2023-10-14 DIAGNOSIS — M7631 Iliotibial band syndrome, right leg: Secondary | ICD-10-CM | POA: Diagnosis not present

## 2023-10-14 DIAGNOSIS — R2689 Other abnormalities of gait and mobility: Secondary | ICD-10-CM | POA: Diagnosis not present

## 2023-10-14 DIAGNOSIS — S83242D Other tear of medial meniscus, current injury, left knee, subsequent encounter: Secondary | ICD-10-CM | POA: Diagnosis not present

## 2023-10-14 DIAGNOSIS — M25562 Pain in left knee: Secondary | ICD-10-CM | POA: Diagnosis not present

## 2023-10-14 DIAGNOSIS — R102 Pelvic and perineal pain: Secondary | ICD-10-CM | POA: Diagnosis not present

## 2023-10-14 DIAGNOSIS — M6259 Muscle wasting and atrophy, not elsewhere classified, multiple sites: Secondary | ICD-10-CM | POA: Diagnosis not present

## 2023-10-14 DIAGNOSIS — M7632 Iliotibial band syndrome, left leg: Secondary | ICD-10-CM | POA: Diagnosis not present

## 2023-10-14 DIAGNOSIS — M7602 Gluteal tendinitis, left hip: Secondary | ICD-10-CM | POA: Diagnosis not present

## 2023-10-14 NOTE — Telephone Encounter (Signed)
Consulted with EB about her case, she would like to see her then potentially refer to urogyn.

## 2023-10-16 DIAGNOSIS — M7631 Iliotibial band syndrome, right leg: Secondary | ICD-10-CM | POA: Diagnosis not present

## 2023-10-16 DIAGNOSIS — R2689 Other abnormalities of gait and mobility: Secondary | ICD-10-CM | POA: Diagnosis not present

## 2023-10-16 DIAGNOSIS — M7602 Gluteal tendinitis, left hip: Secondary | ICD-10-CM | POA: Diagnosis not present

## 2023-10-16 DIAGNOSIS — M7632 Iliotibial band syndrome, left leg: Secondary | ICD-10-CM | POA: Diagnosis not present

## 2023-10-16 DIAGNOSIS — M25562 Pain in left knee: Secondary | ICD-10-CM | POA: Diagnosis not present

## 2023-10-16 DIAGNOSIS — S83242D Other tear of medial meniscus, current injury, left knee, subsequent encounter: Secondary | ICD-10-CM | POA: Diagnosis not present

## 2023-10-16 DIAGNOSIS — M6259 Muscle wasting and atrophy, not elsewhere classified, multiple sites: Secondary | ICD-10-CM | POA: Diagnosis not present

## 2023-10-16 DIAGNOSIS — R102 Pelvic and perineal pain: Secondary | ICD-10-CM | POA: Diagnosis not present

## 2023-10-28 DIAGNOSIS — M7631 Iliotibial band syndrome, right leg: Secondary | ICD-10-CM | POA: Diagnosis not present

## 2023-10-28 DIAGNOSIS — M25562 Pain in left knee: Secondary | ICD-10-CM | POA: Diagnosis not present

## 2023-10-28 DIAGNOSIS — M6259 Muscle wasting and atrophy, not elsewhere classified, multiple sites: Secondary | ICD-10-CM | POA: Diagnosis not present

## 2023-10-28 DIAGNOSIS — S83242D Other tear of medial meniscus, current injury, left knee, subsequent encounter: Secondary | ICD-10-CM | POA: Diagnosis not present

## 2023-10-28 DIAGNOSIS — M7602 Gluteal tendinitis, left hip: Secondary | ICD-10-CM | POA: Diagnosis not present

## 2023-10-28 DIAGNOSIS — R102 Pelvic and perineal pain: Secondary | ICD-10-CM | POA: Diagnosis not present

## 2023-10-28 DIAGNOSIS — R2689 Other abnormalities of gait and mobility: Secondary | ICD-10-CM | POA: Diagnosis not present

## 2023-10-28 DIAGNOSIS — M7632 Iliotibial band syndrome, left leg: Secondary | ICD-10-CM | POA: Diagnosis not present

## 2023-11-07 DIAGNOSIS — R2689 Other abnormalities of gait and mobility: Secondary | ICD-10-CM | POA: Diagnosis not present

## 2023-11-07 DIAGNOSIS — M7631 Iliotibial band syndrome, right leg: Secondary | ICD-10-CM | POA: Diagnosis not present

## 2023-11-07 DIAGNOSIS — M7602 Gluteal tendinitis, left hip: Secondary | ICD-10-CM | POA: Diagnosis not present

## 2023-11-07 DIAGNOSIS — M7632 Iliotibial band syndrome, left leg: Secondary | ICD-10-CM | POA: Diagnosis not present

## 2023-11-07 DIAGNOSIS — S83242D Other tear of medial meniscus, current injury, left knee, subsequent encounter: Secondary | ICD-10-CM | POA: Diagnosis not present

## 2023-11-07 DIAGNOSIS — M25562 Pain in left knee: Secondary | ICD-10-CM | POA: Diagnosis not present

## 2023-11-07 DIAGNOSIS — R102 Pelvic and perineal pain: Secondary | ICD-10-CM | POA: Diagnosis not present

## 2023-11-07 DIAGNOSIS — M6259 Muscle wasting and atrophy, not elsewhere classified, multiple sites: Secondary | ICD-10-CM | POA: Diagnosis not present

## 2023-11-10 DIAGNOSIS — M7632 Iliotibial band syndrome, left leg: Secondary | ICD-10-CM | POA: Diagnosis not present

## 2023-11-10 DIAGNOSIS — R102 Pelvic and perineal pain: Secondary | ICD-10-CM | POA: Diagnosis not present

## 2023-11-10 DIAGNOSIS — M25562 Pain in left knee: Secondary | ICD-10-CM | POA: Diagnosis not present

## 2023-11-10 DIAGNOSIS — M6259 Muscle wasting and atrophy, not elsewhere classified, multiple sites: Secondary | ICD-10-CM | POA: Diagnosis not present

## 2023-11-10 DIAGNOSIS — M7602 Gluteal tendinitis, left hip: Secondary | ICD-10-CM | POA: Diagnosis not present

## 2023-11-10 DIAGNOSIS — M7631 Iliotibial band syndrome, right leg: Secondary | ICD-10-CM | POA: Diagnosis not present

## 2023-11-10 DIAGNOSIS — R2689 Other abnormalities of gait and mobility: Secondary | ICD-10-CM | POA: Diagnosis not present

## 2023-11-10 DIAGNOSIS — S83242D Other tear of medial meniscus, current injury, left knee, subsequent encounter: Secondary | ICD-10-CM | POA: Diagnosis not present

## 2023-11-19 DIAGNOSIS — M7631 Iliotibial band syndrome, right leg: Secondary | ICD-10-CM | POA: Diagnosis not present

## 2023-11-19 DIAGNOSIS — M25562 Pain in left knee: Secondary | ICD-10-CM | POA: Diagnosis not present

## 2023-11-19 DIAGNOSIS — R102 Pelvic and perineal pain: Secondary | ICD-10-CM | POA: Diagnosis not present

## 2023-11-19 DIAGNOSIS — M7632 Iliotibial band syndrome, left leg: Secondary | ICD-10-CM | POA: Diagnosis not present

## 2023-11-19 DIAGNOSIS — S83242D Other tear of medial meniscus, current injury, left knee, subsequent encounter: Secondary | ICD-10-CM | POA: Diagnosis not present

## 2023-11-19 DIAGNOSIS — M7602 Gluteal tendinitis, left hip: Secondary | ICD-10-CM | POA: Diagnosis not present

## 2023-11-19 DIAGNOSIS — R2689 Other abnormalities of gait and mobility: Secondary | ICD-10-CM | POA: Diagnosis not present

## 2023-11-19 DIAGNOSIS — M6259 Muscle wasting and atrophy, not elsewhere classified, multiple sites: Secondary | ICD-10-CM | POA: Diagnosis not present

## 2023-11-24 DIAGNOSIS — Z85828 Personal history of other malignant neoplasm of skin: Secondary | ICD-10-CM | POA: Diagnosis not present

## 2023-11-24 DIAGNOSIS — L603 Nail dystrophy: Secondary | ICD-10-CM | POA: Diagnosis not present

## 2023-11-27 DIAGNOSIS — M25562 Pain in left knee: Secondary | ICD-10-CM | POA: Diagnosis not present

## 2023-11-27 DIAGNOSIS — M7631 Iliotibial band syndrome, right leg: Secondary | ICD-10-CM | POA: Diagnosis not present

## 2023-11-27 DIAGNOSIS — M7632 Iliotibial band syndrome, left leg: Secondary | ICD-10-CM | POA: Diagnosis not present

## 2023-11-27 DIAGNOSIS — S83242D Other tear of medial meniscus, current injury, left knee, subsequent encounter: Secondary | ICD-10-CM | POA: Diagnosis not present

## 2023-11-27 DIAGNOSIS — M6259 Muscle wasting and atrophy, not elsewhere classified, multiple sites: Secondary | ICD-10-CM | POA: Diagnosis not present

## 2023-11-27 DIAGNOSIS — M7602 Gluteal tendinitis, left hip: Secondary | ICD-10-CM | POA: Diagnosis not present

## 2023-11-27 DIAGNOSIS — R102 Pelvic and perineal pain: Secondary | ICD-10-CM | POA: Diagnosis not present

## 2023-11-27 DIAGNOSIS — R2689 Other abnormalities of gait and mobility: Secondary | ICD-10-CM | POA: Diagnosis not present

## 2023-12-04 DIAGNOSIS — S83242D Other tear of medial meniscus, current injury, left knee, subsequent encounter: Secondary | ICD-10-CM | POA: Diagnosis not present

## 2023-12-04 DIAGNOSIS — M7602 Gluteal tendinitis, left hip: Secondary | ICD-10-CM | POA: Diagnosis not present

## 2023-12-04 DIAGNOSIS — M7631 Iliotibial band syndrome, right leg: Secondary | ICD-10-CM | POA: Diagnosis not present

## 2023-12-04 DIAGNOSIS — M25562 Pain in left knee: Secondary | ICD-10-CM | POA: Diagnosis not present

## 2023-12-04 DIAGNOSIS — R102 Pelvic and perineal pain: Secondary | ICD-10-CM | POA: Diagnosis not present

## 2023-12-04 DIAGNOSIS — M7632 Iliotibial band syndrome, left leg: Secondary | ICD-10-CM | POA: Diagnosis not present

## 2023-12-04 DIAGNOSIS — R2689 Other abnormalities of gait and mobility: Secondary | ICD-10-CM | POA: Diagnosis not present

## 2023-12-04 DIAGNOSIS — M6259 Muscle wasting and atrophy, not elsewhere classified, multiple sites: Secondary | ICD-10-CM | POA: Diagnosis not present

## 2023-12-05 DIAGNOSIS — M25551 Pain in right hip: Secondary | ICD-10-CM | POA: Diagnosis not present

## 2023-12-11 DIAGNOSIS — M25562 Pain in left knee: Secondary | ICD-10-CM | POA: Diagnosis not present

## 2023-12-11 DIAGNOSIS — M7602 Gluteal tendinitis, left hip: Secondary | ICD-10-CM | POA: Diagnosis not present

## 2023-12-11 DIAGNOSIS — R102 Pelvic and perineal pain: Secondary | ICD-10-CM | POA: Diagnosis not present

## 2023-12-11 DIAGNOSIS — M6259 Muscle wasting and atrophy, not elsewhere classified, multiple sites: Secondary | ICD-10-CM | POA: Diagnosis not present

## 2023-12-11 DIAGNOSIS — M7631 Iliotibial band syndrome, right leg: Secondary | ICD-10-CM | POA: Diagnosis not present

## 2023-12-11 DIAGNOSIS — M7632 Iliotibial band syndrome, left leg: Secondary | ICD-10-CM | POA: Diagnosis not present

## 2023-12-11 DIAGNOSIS — R2689 Other abnormalities of gait and mobility: Secondary | ICD-10-CM | POA: Diagnosis not present

## 2023-12-11 DIAGNOSIS — S83242D Other tear of medial meniscus, current injury, left knee, subsequent encounter: Secondary | ICD-10-CM | POA: Diagnosis not present

## 2023-12-16 ENCOUNTER — Encounter: Payer: Self-pay | Admitting: Internal Medicine

## 2023-12-16 ENCOUNTER — Ambulatory Visit: Payer: PPO | Admitting: Internal Medicine

## 2023-12-16 VITALS — BP 122/80 | HR 82 | Ht 59.0 in | Wt 164.0 lb

## 2023-12-16 DIAGNOSIS — K5010 Crohn's disease of large intestine without complications: Secondary | ICD-10-CM

## 2023-12-16 DIAGNOSIS — K219 Gastro-esophageal reflux disease without esophagitis: Secondary | ICD-10-CM

## 2023-12-16 DIAGNOSIS — K5900 Constipation, unspecified: Secondary | ICD-10-CM

## 2023-12-16 DIAGNOSIS — Z860101 Personal history of adenomatous and serrated colon polyps: Secondary | ICD-10-CM

## 2023-12-16 DIAGNOSIS — K5790 Diverticulosis of intestine, part unspecified, without perforation or abscess without bleeding: Secondary | ICD-10-CM

## 2023-12-16 DIAGNOSIS — Z8719 Personal history of other diseases of the digestive system: Secondary | ICD-10-CM | POA: Diagnosis not present

## 2023-12-16 DIAGNOSIS — R053 Chronic cough: Secondary | ICD-10-CM

## 2023-12-16 NOTE — Patient Instructions (Signed)
 Continue current medications: Miralax  as needed and omeprazole  daily.   Follow up as needed.   _______________________________________________________  If your blood pressure at your visit was 140/90 or greater, please contact your primary care physician to follow up on this.  _______________________________________________________  If you are age 83 or older, your body mass index should be between 23-30. Your Body mass index is 33.12 kg/m. If this is out of the aforementioned range listed, please consider follow up with your Primary Care Provider.  If you are age 41 or younger, your body mass index should be between 19-25. Your Body mass index is 33.12 kg/m. If this is out of the aformentioned range listed, please consider follow up with your Primary Care Provider.   ________________________________________________________  The [facility] providers would like to encourage you to use MYCHART to communicate with providers for non-urgent requests or questions.  Due to long hold times on the telephone, sending your provider a message by [facility] may be a faster and more efficient way to get a response.  Please allow 48 business hours for a response.  Please remember that this is for non-urgent requests.  _______________________________________________________

## 2023-12-16 NOTE — Progress Notes (Signed)
 Subjective:    Patient ID: [name], female    DOB: 08-31-1941, 83 y.o.   MRN: [id number]  HPI [name] is an 83 year old female with a history of diverticular disease and recurrent diverticulitis, prior adenomatous polyps, history of ischemic colitis, prior breast cancer, chronic cough who is here for follow-up. She is here alone today.  She was last seen here on 08/15/2023 by Alan Coombs, PA-C.  After her last visit she took a month of mesalamine /Lialda  2.4 g daily to reduce inflammation from scad as she was having continued left lower quadrant discomfort.  She contacted me in early November around the sixth with 2 days of abdominal pain in the left and across the lower abdomen.  She also felt constipated at that time.  I recommended a MiraLAX  purge.  She reports this worked and the pain resolved.  It has not recurred.  She is currently managing her gastrointestinal health well and is not taking mesalamine  (Lialda ) as it is not needed. She takes MiraLAX  once or twice a week and experiences no gastrointestinal pain. She is on Prilosec daily and has not yet tapered off.   She is on a medication prescribed by Dr. Arloa that starts with an 'M' to prevent urinary tract infections by turning her urine into formaldehyde.  She has a history of orthopedic issues, including knee and hip pain, which began after a trip to [place] in April and May. The pain was initially in the hip but has since shifted to the knee. She uses a cane and has consulted multiple specialists, including Dr. Alucio. She has a shredded medial meniscus in her knee and tears in the labrum and glute muscles. She has undergone MRIs and is considering further interventions such as injections, arthroscopy, or knee replacement. She previously took meloxicam for about a month and a half but discontinued it due to gastrointestinal side effects. She is cautious about using NSAIDs in the future due to her sensitivity. She takes  Tylenol  periodically for pain, but it is minimally effective.  She is going to accompany her grandson, daughter and son-in-law to [place] for spring break in April.  They also have a trip planned in [place] .  Review of Systems As per HPI, otherwise negative  Current Medications, Allergies, Past Medical History, Past Surgical History, Family History and Social History were reviewed in [employer] record.    Objective:   Physical Exam BP 122/80   Pulse 82   Ht 4' 11 (1.499 m)   Wt 164 lb (74.4 kg)   BMI 33.12 kg/m  Gen: awake, alert, NAD HEENT: anicteric  Abd: soft, NT/ND, +BS throughout Ext: no c/c/e, excellent range of motion in the right hip with no pain on extension, flexion, internal or external rotation. Neuro: nonfocal     Assessment & Plan:  83 year old female with a history of diverticular disease and recurrent diverticulitis, prior adenomatous polyps, history of ischemic colitis, prior breast cancer, chronic cough who is here for follow-up.   History of diverticulosis with segmental colitis and diverticulitis --overall she is doing well.  She did need a MiraLAX  purge for lower abdominal pain in November and we should keep this in mind for future symptoms.  She took mesalamine  for a month which likely helped but without recurrent pain I do not think she should continue this -- Monitor for recurrence -- Continue MiraLAX  17 g up to once to daily as needed for constipation  2.  Chronic cough  related to GERD and nausea --PPI responsive.  On low-dose omeprazole  -- Continue omeprazole  20 mg daily  3.  History of colonic polyps --last colonoscopy September 2022, 3 polyps all very small 1 sessile serrated and 2 adenomatous. --No routine surveillance colonoscopy planned based on age  36 minutes total spent today including patient facing time, coordination of care, reviewing medical history/procedures/pertinent radiology studies, and documentation of  the encounter.

## 2023-12-22 DIAGNOSIS — D1801 Hemangioma of skin and subcutaneous tissue: Secondary | ICD-10-CM | POA: Diagnosis not present

## 2023-12-22 DIAGNOSIS — L821 Other seborrheic keratosis: Secondary | ICD-10-CM | POA: Diagnosis not present

## 2023-12-22 DIAGNOSIS — L603 Nail dystrophy: Secondary | ICD-10-CM | POA: Diagnosis not present

## 2023-12-22 DIAGNOSIS — Z85828 Personal history of other malignant neoplasm of skin: Secondary | ICD-10-CM | POA: Diagnosis not present

## 2023-12-22 DIAGNOSIS — L570 Actinic keratosis: Secondary | ICD-10-CM | POA: Diagnosis not present

## 2023-12-22 DIAGNOSIS — D225 Melanocytic nevi of trunk: Secondary | ICD-10-CM | POA: Diagnosis not present

## 2023-12-22 DIAGNOSIS — L738 Other specified follicular disorders: Secondary | ICD-10-CM | POA: Diagnosis not present

## 2023-12-25 DIAGNOSIS — R2689 Other abnormalities of gait and mobility: Secondary | ICD-10-CM | POA: Diagnosis not present

## 2023-12-25 DIAGNOSIS — S83242D Other tear of medial meniscus, current injury, left knee, subsequent encounter: Secondary | ICD-10-CM | POA: Diagnosis not present

## 2023-12-25 DIAGNOSIS — M7631 Iliotibial band syndrome, right leg: Secondary | ICD-10-CM | POA: Diagnosis not present

## 2023-12-25 DIAGNOSIS — M7632 Iliotibial band syndrome, left leg: Secondary | ICD-10-CM | POA: Diagnosis not present

## 2023-12-25 DIAGNOSIS — M7602 Gluteal tendinitis, left hip: Secondary | ICD-10-CM | POA: Diagnosis not present

## 2023-12-25 DIAGNOSIS — M25562 Pain in left knee: Secondary | ICD-10-CM | POA: Diagnosis not present

## 2023-12-25 DIAGNOSIS — M6259 Muscle wasting and atrophy, not elsewhere classified, multiple sites: Secondary | ICD-10-CM | POA: Diagnosis not present

## 2023-12-25 DIAGNOSIS — R102 Pelvic and perineal pain: Secondary | ICD-10-CM | POA: Diagnosis not present

## 2023-12-26 DIAGNOSIS — M25551 Pain in right hip: Secondary | ICD-10-CM | POA: Diagnosis not present

## 2023-12-26 DIAGNOSIS — M25562 Pain in left knee: Secondary | ICD-10-CM | POA: Diagnosis not present

## 2024-01-02 DIAGNOSIS — R102 Pelvic and perineal pain: Secondary | ICD-10-CM | POA: Diagnosis not present

## 2024-01-02 DIAGNOSIS — R2689 Other abnormalities of gait and mobility: Secondary | ICD-10-CM | POA: Diagnosis not present

## 2024-01-02 DIAGNOSIS — M25562 Pain in left knee: Secondary | ICD-10-CM | POA: Diagnosis not present

## 2024-01-02 DIAGNOSIS — M7631 Iliotibial band syndrome, right leg: Secondary | ICD-10-CM | POA: Diagnosis not present

## 2024-01-02 DIAGNOSIS — M7602 Gluteal tendinitis, left hip: Secondary | ICD-10-CM | POA: Diagnosis not present

## 2024-01-02 DIAGNOSIS — M7632 Iliotibial band syndrome, left leg: Secondary | ICD-10-CM | POA: Diagnosis not present

## 2024-01-02 DIAGNOSIS — S83242D Other tear of medial meniscus, current injury, left knee, subsequent encounter: Secondary | ICD-10-CM | POA: Diagnosis not present

## 2024-01-02 DIAGNOSIS — M6259 Muscle wasting and atrophy, not elsewhere classified, multiple sites: Secondary | ICD-10-CM | POA: Diagnosis not present

## 2024-01-06 DIAGNOSIS — N3020 Other chronic cystitis without hematuria: Secondary | ICD-10-CM | POA: Diagnosis not present

## 2024-01-09 DIAGNOSIS — M7602 Gluteal tendinitis, left hip: Secondary | ICD-10-CM | POA: Diagnosis not present

## 2024-01-09 DIAGNOSIS — M6259 Muscle wasting and atrophy, not elsewhere classified, multiple sites: Secondary | ICD-10-CM | POA: Diagnosis not present

## 2024-01-09 DIAGNOSIS — M7631 Iliotibial band syndrome, right leg: Secondary | ICD-10-CM | POA: Diagnosis not present

## 2024-01-09 DIAGNOSIS — M7632 Iliotibial band syndrome, left leg: Secondary | ICD-10-CM | POA: Diagnosis not present

## 2024-01-09 DIAGNOSIS — R102 Pelvic and perineal pain: Secondary | ICD-10-CM | POA: Diagnosis not present

## 2024-01-09 DIAGNOSIS — R2689 Other abnormalities of gait and mobility: Secondary | ICD-10-CM | POA: Diagnosis not present

## 2024-01-09 DIAGNOSIS — M25562 Pain in left knee: Secondary | ICD-10-CM | POA: Diagnosis not present

## 2024-01-09 DIAGNOSIS — S83242D Other tear of medial meniscus, current injury, left knee, subsequent encounter: Secondary | ICD-10-CM | POA: Diagnosis not present

## 2024-01-13 DIAGNOSIS — I10 Essential (primary) hypertension: Secondary | ICD-10-CM | POA: Diagnosis not present

## 2024-01-13 DIAGNOSIS — R102 Pelvic and perineal pain: Secondary | ICD-10-CM | POA: Diagnosis not present

## 2024-01-13 DIAGNOSIS — N3020 Other chronic cystitis without hematuria: Secondary | ICD-10-CM | POA: Diagnosis not present

## 2024-01-13 DIAGNOSIS — E669 Obesity, unspecified: Secondary | ICD-10-CM | POA: Diagnosis not present

## 2024-01-13 DIAGNOSIS — K760 Fatty (change of) liver, not elsewhere classified: Secondary | ICD-10-CM | POA: Diagnosis not present

## 2024-01-13 DIAGNOSIS — Z79899 Other long term (current) drug therapy: Secondary | ICD-10-CM | POA: Diagnosis not present

## 2024-01-15 DIAGNOSIS — M25562 Pain in left knee: Secondary | ICD-10-CM | POA: Diagnosis not present

## 2024-01-15 DIAGNOSIS — M7632 Iliotibial band syndrome, left leg: Secondary | ICD-10-CM | POA: Diagnosis not present

## 2024-01-15 DIAGNOSIS — M7631 Iliotibial band syndrome, right leg: Secondary | ICD-10-CM | POA: Diagnosis not present

## 2024-01-15 DIAGNOSIS — R102 Pelvic and perineal pain: Secondary | ICD-10-CM | POA: Diagnosis not present

## 2024-01-15 DIAGNOSIS — M7602 Gluteal tendinitis, left hip: Secondary | ICD-10-CM | POA: Diagnosis not present

## 2024-01-15 DIAGNOSIS — M1712 Unilateral primary osteoarthritis, left knee: Secondary | ICD-10-CM | POA: Diagnosis not present

## 2024-01-15 DIAGNOSIS — M6259 Muscle wasting and atrophy, not elsewhere classified, multiple sites: Secondary | ICD-10-CM | POA: Diagnosis not present

## 2024-01-15 DIAGNOSIS — R2689 Other abnormalities of gait and mobility: Secondary | ICD-10-CM | POA: Diagnosis not present

## 2024-01-15 DIAGNOSIS — S83242D Other tear of medial meniscus, current injury, left knee, subsequent encounter: Secondary | ICD-10-CM | POA: Diagnosis not present

## 2024-01-24 ENCOUNTER — Other Ambulatory Visit: Payer: Self-pay | Admitting: Internal Medicine

## 2024-01-26 DIAGNOSIS — N958 Other specified menopausal and perimenopausal disorders: Secondary | ICD-10-CM | POA: Diagnosis not present

## 2024-01-26 DIAGNOSIS — M8588 Other specified disorders of bone density and structure, other site: Secondary | ICD-10-CM | POA: Diagnosis not present

## 2024-01-29 DIAGNOSIS — M7632 Iliotibial band syndrome, left leg: Secondary | ICD-10-CM | POA: Diagnosis not present

## 2024-01-29 DIAGNOSIS — S83242D Other tear of medial meniscus, current injury, left knee, subsequent encounter: Secondary | ICD-10-CM | POA: Diagnosis not present

## 2024-01-29 DIAGNOSIS — R102 Pelvic and perineal pain: Secondary | ICD-10-CM | POA: Diagnosis not present

## 2024-01-29 DIAGNOSIS — M7631 Iliotibial band syndrome, right leg: Secondary | ICD-10-CM | POA: Diagnosis not present

## 2024-01-29 DIAGNOSIS — M1712 Unilateral primary osteoarthritis, left knee: Secondary | ICD-10-CM | POA: Diagnosis not present

## 2024-01-29 DIAGNOSIS — R2689 Other abnormalities of gait and mobility: Secondary | ICD-10-CM | POA: Diagnosis not present

## 2024-01-29 DIAGNOSIS — M25562 Pain in left knee: Secondary | ICD-10-CM | POA: Diagnosis not present

## 2024-01-29 DIAGNOSIS — M6259 Muscle wasting and atrophy, not elsewhere classified, multiple sites: Secondary | ICD-10-CM | POA: Diagnosis not present

## 2024-01-29 DIAGNOSIS — M7602 Gluteal tendinitis, left hip: Secondary | ICD-10-CM | POA: Diagnosis not present

## 2024-02-05 DIAGNOSIS — R102 Pelvic and perineal pain: Secondary | ICD-10-CM | POA: Diagnosis not present

## 2024-02-05 DIAGNOSIS — S83242D Other tear of medial meniscus, current injury, left knee, subsequent encounter: Secondary | ICD-10-CM | POA: Diagnosis not present

## 2024-02-05 DIAGNOSIS — M1712 Unilateral primary osteoarthritis, left knee: Secondary | ICD-10-CM | POA: Diagnosis not present

## 2024-02-05 DIAGNOSIS — M25562 Pain in left knee: Secondary | ICD-10-CM | POA: Diagnosis not present

## 2024-02-05 DIAGNOSIS — R2689 Other abnormalities of gait and mobility: Secondary | ICD-10-CM | POA: Diagnosis not present

## 2024-02-05 DIAGNOSIS — M7632 Iliotibial band syndrome, left leg: Secondary | ICD-10-CM | POA: Diagnosis not present

## 2024-02-05 DIAGNOSIS — M7602 Gluteal tendinitis, left hip: Secondary | ICD-10-CM | POA: Diagnosis not present

## 2024-02-05 DIAGNOSIS — M6259 Muscle wasting and atrophy, not elsewhere classified, multiple sites: Secondary | ICD-10-CM | POA: Diagnosis not present

## 2024-02-05 DIAGNOSIS — M7631 Iliotibial band syndrome, right leg: Secondary | ICD-10-CM | POA: Diagnosis not present

## 2024-02-06 DIAGNOSIS — M170 Bilateral primary osteoarthritis of knee: Secondary | ICD-10-CM | POA: Diagnosis not present

## 2024-02-10 DIAGNOSIS — Z853 Personal history of malignant neoplasm of breast: Secondary | ICD-10-CM | POA: Diagnosis not present

## 2024-02-10 DIAGNOSIS — Z79899 Other long term (current) drug therapy: Secondary | ICD-10-CM | POA: Diagnosis not present

## 2024-02-10 DIAGNOSIS — M179 Osteoarthritis of knee, unspecified: Secondary | ICD-10-CM | POA: Diagnosis not present

## 2024-02-10 DIAGNOSIS — K760 Fatty (change of) liver, not elsewhere classified: Secondary | ICD-10-CM | POA: Diagnosis not present

## 2024-02-10 DIAGNOSIS — E538 Deficiency of other specified B group vitamins: Secondary | ICD-10-CM | POA: Diagnosis not present

## 2024-02-10 DIAGNOSIS — F329 Major depressive disorder, single episode, unspecified: Secondary | ICD-10-CM | POA: Diagnosis not present

## 2024-02-10 DIAGNOSIS — H9190 Unspecified hearing loss, unspecified ear: Secondary | ICD-10-CM | POA: Diagnosis not present

## 2024-02-10 DIAGNOSIS — M8580 Other specified disorders of bone density and structure, unspecified site: Secondary | ICD-10-CM | POA: Diagnosis not present

## 2024-02-10 DIAGNOSIS — J45901 Unspecified asthma with (acute) exacerbation: Secondary | ICD-10-CM | POA: Diagnosis not present

## 2024-02-10 DIAGNOSIS — K5792 Diverticulitis of intestine, part unspecified, without perforation or abscess without bleeding: Secondary | ICD-10-CM | POA: Diagnosis not present

## 2024-02-10 DIAGNOSIS — E669 Obesity, unspecified: Secondary | ICD-10-CM | POA: Diagnosis not present

## 2024-02-10 DIAGNOSIS — I10 Essential (primary) hypertension: Secondary | ICD-10-CM | POA: Diagnosis not present

## 2024-02-12 DIAGNOSIS — R102 Pelvic and perineal pain: Secondary | ICD-10-CM | POA: Diagnosis not present

## 2024-02-12 DIAGNOSIS — S83242D Other tear of medial meniscus, current injury, left knee, subsequent encounter: Secondary | ICD-10-CM | POA: Diagnosis not present

## 2024-02-12 DIAGNOSIS — M25562 Pain in left knee: Secondary | ICD-10-CM | POA: Diagnosis not present

## 2024-02-12 DIAGNOSIS — M1712 Unilateral primary osteoarthritis, left knee: Secondary | ICD-10-CM | POA: Diagnosis not present

## 2024-02-12 DIAGNOSIS — M6259 Muscle wasting and atrophy, not elsewhere classified, multiple sites: Secondary | ICD-10-CM | POA: Diagnosis not present

## 2024-02-12 DIAGNOSIS — R2689 Other abnormalities of gait and mobility: Secondary | ICD-10-CM | POA: Diagnosis not present

## 2024-02-15 DIAGNOSIS — A499 Bacterial infection, unspecified: Secondary | ICD-10-CM | POA: Diagnosis not present

## 2024-02-15 DIAGNOSIS — N390 Urinary tract infection, site not specified: Secondary | ICD-10-CM | POA: Diagnosis not present

## 2024-02-15 DIAGNOSIS — B9689 Other specified bacterial agents as the cause of diseases classified elsewhere: Secondary | ICD-10-CM | POA: Diagnosis not present

## 2024-02-15 DIAGNOSIS — R300 Dysuria: Secondary | ICD-10-CM | POA: Diagnosis not present

## 2024-02-20 DIAGNOSIS — N3020 Other chronic cystitis without hematuria: Secondary | ICD-10-CM | POA: Diagnosis not present

## 2024-02-20 DIAGNOSIS — R102 Pelvic and perineal pain: Secondary | ICD-10-CM | POA: Diagnosis not present

## 2024-02-20 DIAGNOSIS — R8279 Other abnormal findings on microbiological examination of urine: Secondary | ICD-10-CM | POA: Diagnosis not present

## 2024-03-05 DIAGNOSIS — S83242D Other tear of medial meniscus, current injury, left knee, subsequent encounter: Secondary | ICD-10-CM | POA: Diagnosis not present

## 2024-03-05 DIAGNOSIS — M1712 Unilateral primary osteoarthritis, left knee: Secondary | ICD-10-CM | POA: Diagnosis not present

## 2024-03-05 DIAGNOSIS — R102 Pelvic and perineal pain: Secondary | ICD-10-CM | POA: Diagnosis not present

## 2024-03-05 DIAGNOSIS — M25562 Pain in left knee: Secondary | ICD-10-CM | POA: Diagnosis not present

## 2024-03-05 DIAGNOSIS — R2689 Other abnormalities of gait and mobility: Secondary | ICD-10-CM | POA: Diagnosis not present

## 2024-03-05 DIAGNOSIS — M6259 Muscle wasting and atrophy, not elsewhere classified, multiple sites: Secondary | ICD-10-CM | POA: Diagnosis not present

## 2024-03-10 DIAGNOSIS — N3941 Urge incontinence: Secondary | ICD-10-CM | POA: Diagnosis not present

## 2024-03-10 DIAGNOSIS — M6281 Muscle weakness (generalized): Secondary | ICD-10-CM | POA: Diagnosis not present

## 2024-03-10 DIAGNOSIS — R151 Fecal smearing: Secondary | ICD-10-CM | POA: Diagnosis not present

## 2024-03-10 DIAGNOSIS — R102 Pelvic and perineal pain: Secondary | ICD-10-CM | POA: Diagnosis not present

## 2024-03-10 DIAGNOSIS — M6289 Other specified disorders of muscle: Secondary | ICD-10-CM | POA: Diagnosis not present

## 2024-03-15 ENCOUNTER — Encounter (HOSPITAL_COMMUNITY)

## 2024-03-19 ENCOUNTER — Other Ambulatory Visit (HOSPITAL_COMMUNITY): Payer: Self-pay | Admitting: *Deleted

## 2024-03-22 ENCOUNTER — Encounter (HOSPITAL_COMMUNITY)
Admission: RE | Admit: 2024-03-22 | Discharge: 2024-03-22 | Disposition: A | Discharge status: Home / Self Care | Source: Ambulatory Visit | Attending: Internal Medicine | Admitting: Internal Medicine

## 2024-03-22 DIAGNOSIS — M810 Age-related osteoporosis without current pathological fracture: Secondary | ICD-10-CM | POA: Diagnosis not present

## 2024-03-22 MED ORDER — DENOSUMAB 60 MG/ML ~~LOC~~ SOSY
60.0000 mg | PREFILLED_SYRINGE | Freq: Once | SUBCUTANEOUS | Status: AC
  Administered 2024-03-22: 60 mg via SUBCUTANEOUS

## 2024-03-22 MED ORDER — DENOSUMAB 60 MG/ML ~~LOC~~ SOSY
PREFILLED_SYRINGE | SUBCUTANEOUS | Status: AC
  Filled 2024-03-22: qty 1

## 2024-03-24 DIAGNOSIS — M25562 Pain in left knee: Secondary | ICD-10-CM | POA: Diagnosis not present

## 2024-03-24 DIAGNOSIS — M1712 Unilateral primary osteoarthritis, left knee: Secondary | ICD-10-CM | POA: Diagnosis not present

## 2024-03-29 DIAGNOSIS — R8271 Bacteriuria: Secondary | ICD-10-CM | POA: Diagnosis not present

## 2024-03-29 DIAGNOSIS — N3941 Urge incontinence: Secondary | ICD-10-CM | POA: Diagnosis not present

## 2024-03-29 DIAGNOSIS — N3020 Other chronic cystitis without hematuria: Secondary | ICD-10-CM | POA: Diagnosis not present

## 2024-03-29 DIAGNOSIS — R102 Pelvic and perineal pain: Secondary | ICD-10-CM | POA: Diagnosis not present

## 2024-03-29 NOTE — H&P (Signed)
 TOTAL KNEE ADMISSION H&P  Patient is being admitted for left total knee arthroplasty.  Subjective:  Chief Complaint: Left knee pain.  HPI: [name], 83 y.o. female has a history of pain and functional disability in the left knee due to arthritis and has failed non-surgical conservative treatments for greater than 12 weeks to include corticosteriod injections, viscosupplementation injections, and activity modification. Onset of symptoms was gradual, starting several years ago with gradually worsening course since that time. The patient noted no past surgery on the left knee.  Patient currently rates pain in the left knee at 8 out of 10 with activity. Patient has worsening of pain with activity and weight bearing and pain that interferes with activities of daily living. Patient has evidence of periarticular osteophytes and joint space narrowing by imaging studies. There is no active infection.  Patient Active Problem List   Diagnosis Date Noted   Chronic bronchitis (HCC) 10/08/2022   Osteoporosis 03/18/2022   Hx of diverticulitis of colon 06/24/2019   Hx of adenomatous colonic polyps 06/24/2019   Colitis, nonspecific 04/01/2016   Osteopenia 05/08/2015   Malignant neoplasm of upper-outer quadrant of right breast in female, estrogen receptor positive (HCC) 04/21/2014   Rhinitis, allergic to other allergen 09/23/2011   Allergy  history, penicillin 09/23/2011   Cough 06/26/2011    Past Medical History:  Diagnosis Date   Allergic rhinitis    Allergy     Anemia    Anxiety    Asthma    Blood transfusion without reported diagnosis    Breast cancer (HCC) 2011   right side   Cataract    bil removed   Concussion    COVID    Depression    Diverticulitis    Diverticulosis    Endometriosis    GERD (gastroesophageal reflux disease)    Hypertension    IT band syndrome    Neuromuscular disorder (HCC)    ociptal neuralgia   Osteopenia    Pneumonia    Tubular adenoma of colon    UTI  (urinary tract infection)     Past Surgical History:  Procedure Laterality Date   APPENDECTOMY  1988   BACK SURGERY  09/1999   BREAST LUMPECTOMY Right 09/04/2010   w/ Sentinel Node Biopsy   BREAST LUMPECTOMY  1/61096   Fibroadenoma. Left   carpel tunnel Right last five years (04/19/15)   CATARACT EXTRACTION W/ INTRAOCULAR LENS  IMPLANT, BILATERAL Bilateral 05/15/16 and 05/09/16   COLONOSCOPY     LIPOMA EXCISION  08/2002   L shoulder    NASAL SINUS SURGERY  1994   OCCIPITAL NEURECTOMY     Nerve block   POLYPECTOMY     pvd Right    rectal fissure repair  11/1955   TOTAL ABDOMINAL HYSTERECTOMY  1988   TRIGGER FINGER RELEASE     x3   UPPER GASTROINTESTINAL ENDOSCOPY      Prior to Admission medications   Medication Sig Start Date End Date Taking? Authorizing Provider  buPROPion (WELLBUTRIN XL) 300 MG 24 hr tablet Take 300 mg by mouth daily.    [provider]  CALCIUM & MAGNESIUM CARBONATES PO Take 1 tablet by mouth daily.    [provider]  CALCIUM MAGNESIUM ZINC PO Take by mouth.    [provider]  CRANBERRY PO Take by mouth.    [provider]  [facility] SALINE NOSE SPRAY NA Place 1 spray into the nose 2 (two) times daily as needed. Nasal congestion.  [provider]  CYANOCOBALAMIN  IJ Inject 1 vial as directed See admin instructions. Every three months. Patient had shot on 5/8.    [provider]  CYANOCOBALAMIN  PO Take 1 tablet by mouth daily.    [provider]  denosumab  (PROLIA ) 60 MG/ML SOSY injection Inject 60 mg into the skin every 6 (six) months.    [provider]  fluticasone (FLONASE) 50 MCG/ACT nasal spray Place 2 sprays into the nose as needed.      [provider]  KRILL OIL PO Take by mouth.    [provider]  methenamine (HIPREX) 1 g tablet Take by mouth. 06/23/23   [provider]  Multiple Vitamin (MULTIVITAMIN) capsule Take 1 capsule by mouth daily.    [provider]  nebivolol (BYSTOLIC) 5 MG tablet Take 5 mg by mouth daily.      [provider]  omeprazole  (PRILOSEC) 20 MG capsule TAKE 1 CAPSULE BY MOUTH EVERY DAY 01/26/24   Pyrtle, Amber Bail, MD  polyethylene glycol powder (GLYCOLAX/MIRALAX) 17 GM/SCOOP powder Take by mouth daily. 1 tablespoon    [provider]  Probiotic Product (PROBIOTIC 10 ULTRA STRENGTH) CAPS Take by mouth.    [provider]  [employer] syringe  07/19/23   [provider]  Vitamin D, Ergocalciferol, (DRISDOL) 1.25 MG (50000 UNIT) CAPS capsule Take 1 capsule by mouth once a week.    [provider]    Allergies  Allergen Reactions   Latex Rash   Sulfamethoxazole-Trimethoprim Nausea Only    Social History   Socioeconomic History   Marital status: Married    Spouse name: Not on file   Number of children: 2   Years of education: Not on file   Highest education level: Not on file  Occupational History   Occupation: fundraising consultant-retired  Tobacco Use   Smoking status: Former    Current packs/day: 0.00    Average packs/day: 1 pack/day for 10.0 years (10.0 ttl pk-yrs)    Types: Cigarettes    Start date: 11/11/1968    Quit date: 11/11/1978    Years since quitting: 45.4   Smokeless tobacco: Never  Vaping Use   Vaping status: Never Used  Substance and Sexual Activity   Alcohol  use: Yes    Comment: social   Drug use: No   Sexual activity: Not Currently    Partners: Male    Birth control/protection: Surgical    Comment: hysterectomy, menarche 83yo, sexual debut 83yo  Other Topics Concern   Not on file  Social History Narrative   Not on file   Social Drivers of Health   Financial Resource Strain: Not on file  Food Insecurity: No Food Insecurity (10/08/2021)   Received from [facility], [facility]   Hunger Vital Sign    Worried About Running Out of Food in the Last Year: Never true    Ran Out of Food in the Last Year: Never true  Transportation Needs:  Not on file  Physical Activity: Not on file  Stress: Not on file  Social Connections: Unknown (03/22/2022)   Received from [facility], [facility]   Social Network    Social Network: Not on file  Intimate Partner Violence: Unknown (02/11/2022)   Received from [facility], [facility]   HITS    Physically Hurt: Not on file    Insult or Talk Down To: Not on file    Threaten Physical Harm: Not on file    Scream or  Curse: Not on file    Tobacco Use: Low Risk  (02/15/2024)   Received from [facility]   Patient History    Smoking Tobacco Use: Never    Smokeless Tobacco Use: Never    Passive Exposure: Not on file  Recent Concern: Tobacco Use - Medium Risk (12/16/2023)   Patient History    Smoking Tobacco Use: Former    Smokeless Tobacco Use: Never    Passive Exposure: Not on file   Social History   Substance and Sexual Activity  Alcohol  Use Yes   Comment: social    Family History  Problem Relation Age of Onset   Heart disease Mother    Heart disease Father    Lung cancer Father        smoker   Heart attack Father    Melanoma Daughter    Colon cancer Neg Hx    Colon polyps Neg Hx    Pancreatic cancer Neg Hx    Esophageal cancer Neg Hx    Rectal cancer Neg Hx    Stomach cancer Neg Hx     ROS  Objective:  Physical Exam: - Well-developed female, alert, oriented, no apparent distress.   - Left hip shows normal range of motion with no discomfort.   - Left knee shows no effusion. Range of motion is 0-135 degrees. There is crepitus on range of motion. Very tender medially. No lateral tenderness or instability noted. Gait pattern is antalgic on the left, utilizing a cane.   - Right knee shows no effusion. Range of motion is 0-135 degrees. No tenderness or instability.    IMAGING:   - Radiographs from June 2024 demonstrate significant medial joint space narrowing and patellofemoral narrowing, not fully bone on bone but close.   - MRI from July 2024 demonstrates  full-thickness cartilage loss in the medial femoral condyle and medial tibial plateau with an extruded medial meniscus tear, as well as subchondral cystic changes in the femur at multiple sites. Patellofemoral arthritis is also noted and is more advanced than the plain radiographs  Assessment/Plan:  End stage arthritis, left knee   The patient history, physical examination, clinical judgment of the provider and imaging studies are consistent with end stage degenerative joint disease of the left knee and total knee arthroplasty is deemed medically necessary. The treatment options including medical management, injection therapy arthroscopy and arthroplasty were discussed at length. The risks and benefits of total knee arthroplasty were presented and reviewed. The risks due to aseptic loosening, infection, stiffness, patella tracking problems, thromboembolic complications and other imponderables were discussed. The patient acknowledged the explanation, agreed to proceed with the plan and consent was signed. Patient is being admitted for inpatient treatment for surgery, pain control, PT, OT, prophylactic antibiotics, VTE prophylaxis, progressive ambulation and ADLs and discharge planning. The patient is planning to be discharged to rehab section of [facility].   Patient's anticipated LOS is less than 2 midnights, meeting these requirements: - Younger than 84 - Lives within 1 hour of care - Has a competent adult at home to recover with post-op recover - NO history of  - Chronic pain requiring opiods  - Diabetes  - Coronary Artery Disease  - Heart failure  - Heart attack  - Stroke  - DVT/VTE  - Cardiac arrhythmia  - Respiratory Failure/COPD  - Renal failure  - Anemia  - Advanced Liver disease   Therapy Plans: [facility] Disposition: Home to [facility], daughter will be with her for first day at home  Planned DVT Prophylaxis: Xarelto 10mg  (hx breast cancer 2011) DME Needed: None PCP: Aldo Hun, MD (clearance received)  TXA: IV Allergies: LATEX, meloxicam (GI upset), sulfa (intolerance) Anesthesia Concerns: None, prefers spinal  BMI: 33.1 Last HgbA1c: Not diabetic Pharmacy: Fax Rx's to [facility] supervisor at [phone]  Other: -Social worker at [employer]: Raliegh Burgess, [phone], [email or web address] -Discharge RX's faxed to [facility] in advance so they could be obtained and ready for the pt upon arrival -Order Miralax for post-op use  -Reports hx of diverticulitis, recurrent UTIs, chronic low Na. Most recent Na 127 on 02/10/24  - Patient was instructed on what medications to stop prior to surgery. - Follow-up visit in 2 weeks with Dr. France Ina - Begin physical therapy following surgery - Pre-operative lab work as pre-surgical testing - Prescriptions will be provided in hospital at time of discharge  Angelo Kennedy, PA-C Orthopedic Surgery [facility] [place]

## 2024-04-02 DIAGNOSIS — S83242D Other tear of medial meniscus, current injury, left knee, subsequent encounter: Secondary | ICD-10-CM | POA: Diagnosis not present

## 2024-04-02 DIAGNOSIS — R2689 Other abnormalities of gait and mobility: Secondary | ICD-10-CM | POA: Diagnosis not present

## 2024-04-02 DIAGNOSIS — M1712 Unilateral primary osteoarthritis, left knee: Secondary | ICD-10-CM | POA: Diagnosis not present

## 2024-04-02 DIAGNOSIS — M6259 Muscle wasting and atrophy, not elsewhere classified, multiple sites: Secondary | ICD-10-CM | POA: Diagnosis not present

## 2024-04-02 DIAGNOSIS — M25562 Pain in left knee: Secondary | ICD-10-CM | POA: Diagnosis not present

## 2024-04-02 DIAGNOSIS — R102 Pelvic and perineal pain: Secondary | ICD-10-CM | POA: Diagnosis not present

## 2024-04-06 NOTE — Progress Notes (Addendum)
 Anesthesia Review:  PCP:  Aldo Hun  Cardiologist : none  Urologist- DR Dulcy Gibney   PPM/ ICD: Device Orders: Rep Notified:  Chest x-ray : EKG : 04/12/24  CT Card- 2022  Echo : Stress test: 2005  Cardiac Cath :   Activity level: can do a flight of stairs without difficuty  Sleep Study/ CPAP : none  Fasting Blood Sugar :      / Checks Blood Sugar -- times a day:    Blood Thinner/ Instructions /Last Dose: ASA / Instructions/ Last Dose :    Latex Allergy     No B/P etc in right arm   PT with hx of UTIs.  Pt diagnosed on 04/10/24 with UTI placed on augmentin.  PT states at preop on 04/12/24 that both Dr Dulcy Gibney and DR France Ina are aware and that she is on Augmentin,.  U/A done on 04/12/24.     PT with chronic low Sodium per pt .   Lives at [place] in [place].  Will go to REhab at [facility] after surgery    Hearing Aids   U/A done 04/12/24 rotued to Dr Dulcy Gibney and DR Aluisio on 04/12/24.    Sodium of 127 at prep on 04/12/24.  Routed to Dr France Ina.  Mahalia Schultze aware.  Sodium of 127 at preop ( BMP) was roued to DR Perini on 04/12/24. Mahalia Schultze aware.

## 2024-04-07 NOTE — Patient Instructions (Signed)
 SURGICAL WAITING ROOM VISITATION  Patients having surgery or a procedure may have no more than 2 support people in the waiting area - these visitors may rotate.    Children under the age of 92 must have an adult with them who is not the patient.   Visitors with respiratory illnesses are discouraged from visiting and should remain at home.  If the patient needs to stay at the hospital during part of their recovery, the visitor guidelines for inpatient rooms apply. Pre-op nurse will coordinate an appropriate time for 1 support person to accompany patient in pre-op.  This support person may not rotate.    Please refer to the [facility] website for the visitor guidelines for Inpatients (after your surgery is over and you are in a regular room).       Your procedure is scheduled on:  04/19/2024    Report to [facility] Main Entrance    Report to admitting at  0700 AM   Call this number if you have problems the morning of surgery [phone]   Do not eat food :After Midnight.   After Midnight you may have the following liquids until ___ 0615___ AM  DAY OF SURGERY  Water Non-Citrus Juices (without pulp, NO RED-Apple, White grape, White cranberry) Black Coffee (NO MILK/CREAM OR CREAMERS, sugar ok)  Clear Tea (NO MILK/CREAM OR CREAMERS, sugar ok) regular and decaf                             Plain Jell-O (NO RED)                                           Fruit ices (not with fruit pulp, NO RED)                                     Popsicles (NO RED)                                                               Sports drinks like Gatorade (NO RED)                   The day of surgery:  Drink ONE (1) Pre-Surgery Clear Ensure or G2 at 0615 AM ( have completed by )  the morning of surgery. Drink in one sitting. Do not sip.  This drink was given to you during your hospital  pre-op appointment visit. Nothing else to drink after completing the  Pre-Surgery Clear Ensure or G2.           If you have questions, please contact your surgeon's office.       Oral Hygiene is also important to reduce your risk of infection.                                    Remember - BRUSH YOUR TEETH THE MORNING OF SURGERY WITH YOUR REGULAR TOOTHPASTE  DENTURES WILL BE REMOVED PRIOR TO SURGERY PLEASE  DO NOT APPLY "Poly grip" OR ADHESIVES!!!   Do NOT smoke after Midnight   Stop all vitamins and herbal supplements 7 days before surgery.   Take these medicines the morning of surgery with A SIP OF WATER:  wellbutrin, methenamine, nebivolol, omeprazole    DO NOT TAKE ANY ORAL DIABETIC MEDICATIONS DAY OF YOUR SURGERY  Bring CPAP mask and tubing day of surgery.                              You may not have any metal on your body including hair pins, jewelry, and body piercing             Do not wear make-up, lotions, powders, perfumes/cologne, or deodorant  Do not wear nail polish including gel and S&S, artificial/acrylic nails, or any other type of covering on natural nails including finger and toenails. If you have artificial nails, gel coating, etc. that needs to be removed by a nail salon please have this removed prior to surgery or surgery may need to be canceled/ delayed if the surgeon/ anesthesia feels like they are unable to be safely monitored.   Do not shave  48 hours prior to surgery.               Men may shave face and neck.   Do not bring valuables to the hospital. [facility] IS NOT             RESPONSIBLE   FOR VALUABLES.   Contacts, glasses, dentures or bridgework may not be worn into surgery.   Bring small overnight bag day of surgery.   DO NOT BRING YOUR HOME MEDICATIONS TO THE HOSPITAL. PHARMACY WILL DISPENSE MEDICATIONS LISTED ON YOUR MEDICATION LIST TO YOU DURING YOUR ADMISSION IN THE HOSPITAL!    Patients discharged on the day of surgery will not be allowed to drive home.  Someone NEEDS to stay with you for the first 24 hours after anesthesia.   Special  Instructions: Bring a copy of your healthcare power of attorney and living will documents the day of surgery if you haven't scanned them before.              Please read over the following fact sheets you were given: IF YOU HAVE QUESTIONS ABOUT YOUR PRE-OP INSTRUCTIONS PLEASE CALL [phone]   If you received a COVID test during your pre-op visit  it is requested that you wear a mask when out in public, stay away from anyone that may not be feeling well and notify your surgeon if you develop symptoms. If you test positive for Covid or have been in contact with anyone that has tested positive in the last 10 days please notify you surgeon.      Pre-operative 5 CHG Bath Instructions   You can play a key role in reducing the risk of infection after surgery. Your skin needs to be as free of germs as possible. You can reduce the number of germs on your skin by washing with CHG (chlorhexidine gluconate) soap before surgery. CHG is an antiseptic soap that kills germs and continues to kill germs even after washing.   DO NOT use if you have an allergy  to chlorhexidine/CHG or antibacterial soaps. If your skin becomes reddened or irritated, stop using the CHG and notify one of our RNs at [phone].   Please shower with the CHG soap starting 4 days before surgery using the following schedule:  Please keep in mind the following:  DO NOT shave, including legs and underarms, starting the day of your first shower.   You may shave your face at any point before/day of surgery.  Place clean sheets on your bed the day you start using CHG soap. Use a clean washcloth (not used since being washed) for each shower. DO NOT sleep with pets once you start using the CHG.   CHG Shower Instructions:  If you choose to wash your hair and private area, wash first with your normal shampoo/soap.  After you use shampoo/soap, rinse your hair and body thoroughly to remove shampoo/soap residue.  Turn the water OFF and  apply about 3 tablespoons (45 ml) of CHG soap to a CLEAN washcloth.  Apply CHG soap ONLY FROM YOUR NECK DOWN TO YOUR TOES (washing for 3-5 minutes)  DO NOT use CHG soap on face, private areas, open wounds, or sores.  Pay special attention to the area where your surgery is being performed.  If you are having back surgery, having someone wash your back for you may be helpful. Wait 2 minutes after CHG soap is applied, then you may rinse off the CHG soap.  Pat dry with a clean towel  Put on clean clothes/pajamas   If you choose to wear lotion, please use ONLY the CHG-compatible lotions on the back of this paper.     Additional instructions for the day of surgery: DO NOT APPLY any lotions, deodorants, cologne, or perfumes.   Put on clean/comfortable clothes.  Brush your teeth.  Ask your nurse before applying any prescription medications to the skin.      CHG Compatible Lotions   Aveeno Moisturizing lotion  Cetaphil Moisturizing Cream  Cetaphil Moisturizing Lotion  Clairol Herbal Essence Moisturizing Lotion, Dry Skin  Clairol Herbal Essence Moisturizing Lotion, Extra Dry Skin  Clairol Herbal Essence Moisturizing Lotion, Normal Skin  Curel Age Defying Therapeutic Moisturizing Lotion with Alpha Hydroxy  Curel Extreme Care Body Lotion  Curel Soothing Hands Moisturizing Hand Lotion  Curel Therapeutic Moisturizing Cream, Fragrance-Free  Curel Therapeutic Moisturizing Lotion, Fragrance-Free  Curel Therapeutic Moisturizing Lotion, Original Formula  Eucerin Daily Replenishing Lotion  Eucerin Dry Skin Therapy Plus Alpha Hydroxy Crme  Eucerin Dry Skin Therapy Plus Alpha Hydroxy Lotion  Eucerin Original Crme  Eucerin Original Lotion  Eucerin Plus Crme Eucerin Plus Lotion  Eucerin TriLipid Replenishing Lotion  Keri Anti-Bacterial Hand Lotion  Keri Deep Conditioning Original Lotion Dry Skin Formula Softly Scented  Keri Deep Conditioning Original Lotion, Fragrance Free Sensitive Skin  Formula  Keri Lotion Fast Absorbing Fragrance Free Sensitive Skin Formula  Keri Lotion Fast Absorbing Softly Scented Dry Skin Formula  Keri Original Lotion  Keri Skin Renewal Lotion Keri Silky Smooth Lotion  Keri Silky Smooth Sensitive Skin Lotion  Nivea Body Creamy Conditioning Oil  Nivea Body Extra Enriched Teacher, adult education Moisturizing Lotion Nivea Crme  Nivea Skin Firming Lotion  NutraDerm 30 Skin Lotion  NutraDerm Skin Lotion  NutraDerm Therapeutic Skin Cream  NutraDerm Therapeutic Skin Lotion  ProShield Protective Hand Cream  Provon moisturizing lotion

## 2024-04-10 ENCOUNTER — Telehealth: Payer: Self-pay

## 2024-04-10 MED ORDER — AMOXICILLIN-POT CLAVULANATE 875-125 MG PO TABS: 1.0000 | ORAL_TABLET | Freq: Two times a day (BID) | ORAL | 0 refills | Status: DC

## 2024-04-10 NOTE — Telephone Encounter (Signed)
 Patient calling because she's having UTI symptoms. Culture reviewed in [facility], pansensitive E coli. Sending in Augmentin to take through time of her knee surgery on 6/9. Patient appreciative of the call.

## 2024-04-12 ENCOUNTER — Encounter (HOSPITAL_COMMUNITY)
Admission: RE | Admit: 2024-04-12 | Discharge: 2024-04-12 | Disposition: A | Discharge status: Home / Self Care | Source: Ambulatory Visit | Attending: Orthopedic Surgery | Admitting: Orthopedic Surgery

## 2024-04-12 ENCOUNTER — Other Ambulatory Visit: Payer: Self-pay

## 2024-04-12 ENCOUNTER — Encounter (HOSPITAL_COMMUNITY): Payer: Self-pay

## 2024-04-12 VITALS — BP 142/90 | HR 76 | Temp 98.0°F | Resp 16 | Ht 59.0 in | Wt 147.0 lb

## 2024-04-12 DIAGNOSIS — M1712 Unilateral primary osteoarthritis, left knee: Secondary | ICD-10-CM | POA: Insufficient documentation

## 2024-04-12 DIAGNOSIS — J45909 Unspecified asthma, uncomplicated: Secondary | ICD-10-CM | POA: Diagnosis not present

## 2024-04-12 DIAGNOSIS — Z853 Personal history of malignant neoplasm of breast: Secondary | ICD-10-CM | POA: Insufficient documentation

## 2024-04-12 DIAGNOSIS — Z8616 Personal history of COVID-19: Secondary | ICD-10-CM | POA: Diagnosis not present

## 2024-04-12 DIAGNOSIS — Z9221 Personal history of antineoplastic chemotherapy: Secondary | ICD-10-CM | POA: Diagnosis not present

## 2024-04-12 DIAGNOSIS — I10 Essential (primary) hypertension: Secondary | ICD-10-CM | POA: Insufficient documentation

## 2024-04-12 DIAGNOSIS — Z87891 Personal history of nicotine dependence: Secondary | ICD-10-CM | POA: Insufficient documentation

## 2024-04-12 DIAGNOSIS — N3000 Acute cystitis without hematuria: Secondary | ICD-10-CM | POA: Insufficient documentation

## 2024-04-12 DIAGNOSIS — Z01818 Encounter for other preprocedural examination: Secondary | ICD-10-CM | POA: Insufficient documentation

## 2024-04-12 DIAGNOSIS — Z923 Personal history of irradiation: Secondary | ICD-10-CM | POA: Diagnosis not present

## 2024-04-12 DIAGNOSIS — E871 Hypo-osmolality and hyponatremia: Secondary | ICD-10-CM | POA: Insufficient documentation

## 2024-04-12 DIAGNOSIS — J439 Emphysema, unspecified: Secondary | ICD-10-CM | POA: Insufficient documentation

## 2024-04-12 HISTORY — DX: Unspecified osteoarthritis, unspecified site: M19.90

## 2024-04-12 LAB — URINALYSIS, ROUTINE W REFLEX MICROSCOPIC
Bilirubin Urine: NEGATIVE
Glucose, UA: NEGATIVE mg/dL
Hgb urine dipstick: NEGATIVE
Ketones, ur: NEGATIVE mg/dL
Leukocytes,Ua: NEGATIVE
Nitrite: NEGATIVE
Protein, ur: NEGATIVE mg/dL
Specific Gravity, Urine: 1.009 (ref 1.005–1.030)
pH: 6 (ref 5.0–8.0)

## 2024-04-12 LAB — BASIC METABOLIC PANEL WITH GFR
Anion gap: 11 (ref 5–15)
BUN: 11 mg/dL (ref 8–23)
CO2: 21 mmol/L — ABNORMAL LOW (ref 22–32)
Calcium: 8.6 mg/dL — ABNORMAL LOW (ref 8.9–10.3)
Chloride: 95 mmol/L — ABNORMAL LOW (ref 98–111)
Creatinine, Ser: 0.91 mg/dL (ref 0.44–1.00)
GFR, Estimated: >60 mL/min (ref >60)
Glucose, Bld: 81 mg/dL (ref 70–99)
Potassium: 4.3 mmol/L (ref 3.5–5.1)
Sodium: 127 mmol/L — ABNORMAL LOW (ref 135–145)

## 2024-04-12 LAB — CBC
HCT: 39.3 % (ref 36.0–46.0)
Hemoglobin: 13 g/dL (ref 12.0–15.0)
MCH: 31.6 pg (ref 26.0–34.0)
MCHC: 33.1 g/dL (ref 30.0–36.0)
MCV: 95.6 fL (ref 80.0–100.0)
Platelets: 365 10*3/uL (ref 150–400)
RBC: 4.11 MIL/uL (ref 3.87–5.11)
RDW: 13.2 % (ref 11.5–15.5)
WBC: 5.9 10*3/uL (ref 4.0–10.5)
nRBC: 0 % (ref 0.0–0.2)

## 2024-04-12 LAB — SURGICAL PCR SCREEN
MRSA, PCR: NEGATIVE
Staphylococcus aureus: POSITIVE — AB

## 2024-04-13 ENCOUNTER — Encounter (HOSPITAL_COMMUNITY): Payer: Self-pay

## 2024-04-13 NOTE — Anesthesia Preprocedure Evaluation (Addendum)
 Anesthesia Evaluation  Patient identified by MRN, date of birth, ID band Patient awake    Reviewed: Allergy  & Precautions, H&P , NPO status , Patient's Chart, lab work & pertinent test results  Airway Mallampati: II  TM Distance: >3 FB Neck ROM: Full    Dental no notable dental hx.    Pulmonary asthma , former smoker   Pulmonary exam normal breath sounds clear to auscultation       Cardiovascular hypertension, Pt. on medications negative cardio ROS Normal cardiovascular exam Rhythm:Regular Rate:Normal     Neuro/Psych   Anxiety Depression    negative neurological ROS  negative psych ROS   GI/Hepatic Neg liver ROS,GERD  ,,  Endo/Other  negative endocrine ROS    Renal/GU negative Renal ROS  negative genitourinary   Musculoskeletal  (+) Arthritis , Osteoarthritis,    Abdominal   Peds negative pediatric ROS (+)  Hematology negative hematology ROS (+)   Anesthesia Other Findings   Reproductive/Obstetrics negative OB ROS                             Anesthesia Physical Anesthesia Plan  ASA: 2  Anesthesia Plan: Spinal   Post-op Pain Management: Regional block*   Induction: Intravenous  PONV Risk Score and Plan: 2 and Propofol infusion and Treatment may vary due to age or medical condition  Airway Management Planned: Simple Face Mask  Additional Equipment:   Intra-op Plan:   Post-operative Plan:   Informed Consent: I have reviewed the patients History and Physical, chart, labs and discussed the procedure including the risks, benefits and alternatives for the proposed anesthesia with the patient or authorized representative who has indicated his/her understanding and acceptance.     Dental advisory given  Plan Discussed with: CRNA  Anesthesia Plan Comments: (See PAT note from 6/2)        Anesthesia Quick Evaluation

## 2024-04-13 NOTE — Progress Notes (Signed)
 Case: [id number] Date/Time: 04/19/24 0910   Procedure: ARTHROPLASTY, KNEE, TOTAL (Left: Knee)   Anesthesia type: Choice   Pre-op diagnosis: Left Knee Osteoarthritis   Location: [facility] ROOM 09 / [facility]   Surgeons: Liliane Rei, MD       DISCUSSION: [name] is an 83 yo female who presents to PAT prior to surgery above. PMH of former smoking, HTN, asthma, chronic cough, GERD, chronic hyponatremia, anemia, hx of breast cancer s/p R lumpectomy, chemo/XRT (2011).  Follows with Pulmonology for chronic cough. Of note, Emphysema noted on prior CT however with no obstructive defect on PFTs. Pt recommended to treat allergies and GERD for chronic cough. Also uses Atrovent .  Patient with hx of chronic hyponatremia. Unclear cause. Na was 127 on PAT labs. Per patient her Na was 129 at her PCP's office not long ago. She denies any symptoms. She states she was advised by her PCP to increase her salt intake and cut back on water but she has to drink water due to her other medical issues (being treated for UTI).  Medical clearance signed that patient is cleared from medical and cardiac perspective (scanned in media on 01/21/24)  VS: BP (!) 142/90   Pulse 76   Temp 36.7 C (Oral)   Resp 16   Ht 4\' 11"  (1.499 m)   Wt 66.7 kg   SpO2 100%   BMI 29.69 kg/m   PROVIDERS: Aldo Hun, MD   LABS: Chronic hyponatremia which is stable. Consider repeat DOS (all labs ordered are listed, but only abnormal results are displayed)  Labs Reviewed  SURGICAL PCR SCREEN - Abnormal; Notable for the following components:      Result Value   Staphylococcus aureus POSITIVE (*)    All other components within normal limits  BASIC METABOLIC PANEL WITH GFR - Abnormal; Notable for the following components:   Sodium 127 (*)    Chloride 95 (*)    CO2 21 (*)    Calcium 8.6 (*)    All other components within normal limits  URINALYSIS, ROUTINE W REFLEX MICROSCOPIC  CBC     IMAGES:   EKG 04/12/24  NSR, rate  77  CV:  CT Calcium score 05/21/2021:  IMPRESSION: No signs of calcified coronary artery disease. Coronary calcium score of 0.   Past Medical History:  Diagnosis Date   Allergic rhinitis    Allergy     Anemia    Anxiety    Arthritis    Asthma    Blood transfusion without reported diagnosis    Breast cancer (HCC) 2011   right side   Cataract    bil removed   Concussion    COVID    Depression    Diverticulitis    Diverticulosis    Endometriosis    GERD (gastroesophageal reflux disease)    Hypertension    IT band syndrome    Neuromuscular disorder (HCC)    ociptal neuralgia   Osteopenia    Pneumonia    Tubular adenoma of colon    UTI (urinary tract infection)     Past Surgical History:  Procedure Laterality Date   APPENDECTOMY  1988   BACK SURGERY  09/1999   BREAST LUMPECTOMY Right 09/04/2010   w/ Sentinel Node Biopsy   BREAST LUMPECTOMY  1/47829   Fibroadenoma. Left   carpel tunnel Right last five years (04/19/15)   CATARACT EXTRACTION W/ INTRAOCULAR LENS  IMPLANT, BILATERAL Bilateral 05/15/16 and 05/09/16   COLONOSCOPY     LIPOMA EXCISION  08/2002   L shoulder    NASAL SINUS SURGERY  1994   OCCIPITAL NEURECTOMY     Nerve block   POLYPECTOMY     pvd Right    rectal fissure repair  11/1955   TOTAL ABDOMINAL HYSTERECTOMY  1988   TRIGGER FINGER RELEASE     x3   UPPER GASTROINTESTINAL ENDOSCOPY      MEDICATIONS:  amoxicillin-clavulanate (AUGMENTIN) 875-125 MG tablet   buPROPion (WELLBUTRIN XL) 150 MG 24 hr tablet   CALCIUM MAGNESIUM ZINC PO   cetirizine (ZYRTEC) 10 MG tablet   CRANBERRY PO   [facility] SALINE NOSE SPRAY NA   cyanocobalamin  (VITAMIN B12) 1000 MCG/ML injection   denosumab  (PROLIA ) 60 MG/ML SOSY injection   fexofenadine (ALLEGRA) 180 MG tablet   fluticasone (FLONASE) 50 MCG/ACT nasal spray   Krill Oil 300 MG CAPS   Multiple Vitamin (MULTIVITAMIN) capsule   nebivolol (BYSTOLIC) 5 MG tablet   omeprazole  (PRILOSEC) 20 MG capsule   ondansetron   (ZOFRAN -ODT) 8 MG disintegrating tablet   polyethylene glycol powder (GLYCOLAX/MIRALAX) 17 GM/SCOOP powder   Probiotic Product (PROBIOTIC 10 ULTRA STRENGTH) CAPS   Vitamin D, Ergocalciferol, (DRISDOL) 1.25 MG (50000 UNIT) CAPS capsule   No current facility-administered medications for this encounter.   Antoinette Kirschner [facility] Surgical Short Stay/Anesthesiology [facility] Phone [phone] 04/13/2024 1:22 PM

## 2024-04-15 DIAGNOSIS — M1712 Unilateral primary osteoarthritis, left knee: Secondary | ICD-10-CM | POA: Diagnosis not present

## 2024-04-15 DIAGNOSIS — M25562 Pain in left knee: Secondary | ICD-10-CM | POA: Diagnosis not present

## 2024-04-15 DIAGNOSIS — M6259 Muscle wasting and atrophy, not elsewhere classified, multiple sites: Secondary | ICD-10-CM | POA: Diagnosis not present

## 2024-04-15 DIAGNOSIS — R102 Pelvic and perineal pain: Secondary | ICD-10-CM | POA: Diagnosis not present

## 2024-04-15 DIAGNOSIS — R2689 Other abnormalities of gait and mobility: Secondary | ICD-10-CM | POA: Diagnosis not present

## 2024-04-15 DIAGNOSIS — S83242D Other tear of medial meniscus, current injury, left knee, subsequent encounter: Secondary | ICD-10-CM | POA: Diagnosis not present

## 2024-04-16 DIAGNOSIS — N3020 Other chronic cystitis without hematuria: Secondary | ICD-10-CM | POA: Diagnosis not present

## 2024-04-19 ENCOUNTER — Encounter (HOSPITAL_COMMUNITY)
Admission: RE | Disposition: A | Discharge status: Skilled Nursing Facility | Payer: Self-pay | Source: Home / Self Care | Attending: Orthopedic Surgery

## 2024-04-19 ENCOUNTER — Ambulatory Visit (HOSPITAL_COMMUNITY): Admitting: Anesthesiology

## 2024-04-19 ENCOUNTER — Ambulatory Visit (HOSPITAL_COMMUNITY): Payer: Self-pay | Admitting: Medical

## 2024-04-19 ENCOUNTER — Other Ambulatory Visit: Payer: Self-pay

## 2024-04-19 ENCOUNTER — Encounter (HOSPITAL_COMMUNITY): Payer: Self-pay | Admitting: Orthopedic Surgery

## 2024-04-19 ENCOUNTER — Observation Stay (HOSPITAL_COMMUNITY)
Admission: RE | Admit: 2024-04-19 | Discharge: 2024-04-20 | Disposition: A | Discharge status: Skilled Nursing Facility | Attending: Orthopedic Surgery | Admitting: Orthopedic Surgery

## 2024-04-19 DIAGNOSIS — Z91040 Latex allergy status: Secondary | ICD-10-CM | POA: Insufficient documentation

## 2024-04-19 DIAGNOSIS — Z79899 Other long term (current) drug therapy: Secondary | ICD-10-CM | POA: Insufficient documentation

## 2024-04-19 DIAGNOSIS — Z87891 Personal history of nicotine dependence: Secondary | ICD-10-CM | POA: Diagnosis not present

## 2024-04-19 DIAGNOSIS — N3000 Acute cystitis without hematuria: Principal | ICD-10-CM

## 2024-04-19 DIAGNOSIS — M1712 Unilateral primary osteoarthritis, left knee: Secondary | ICD-10-CM

## 2024-04-19 DIAGNOSIS — Z853 Personal history of malignant neoplasm of breast: Secondary | ICD-10-CM | POA: Diagnosis not present

## 2024-04-19 DIAGNOSIS — M179 Osteoarthritis of knee, unspecified: Secondary | ICD-10-CM | POA: Diagnosis present

## 2024-04-19 DIAGNOSIS — Z8616 Personal history of COVID-19: Secondary | ICD-10-CM | POA: Insufficient documentation

## 2024-04-19 DIAGNOSIS — J45909 Unspecified asthma, uncomplicated: Secondary | ICD-10-CM | POA: Insufficient documentation

## 2024-04-19 DIAGNOSIS — I10 Essential (primary) hypertension: Secondary | ICD-10-CM | POA: Insufficient documentation

## 2024-04-19 DIAGNOSIS — G8918 Other acute postprocedural pain: Secondary | ICD-10-CM | POA: Diagnosis not present

## 2024-04-19 DIAGNOSIS — Z01818 Encounter for other preprocedural examination: Secondary | ICD-10-CM

## 2024-04-19 HISTORY — PX: TOTAL KNEE ARTHROPLASTY: SHX125

## 2024-04-19 SURGERY — ARTHROPLASTY, KNEE, TOTAL
Anesthesia: Spinal | Site: Knee | Laterality: Left

## 2024-04-19 MED ORDER — LIDOCAINE HCL (PF) 2 % IJ SOLN
INTRAMUSCULAR | Status: AC
  Filled 2024-04-19: qty 10

## 2024-04-19 MED ORDER — HYDROMORPHONE HCL 1 MG/ML IJ SOLN: 0.2500 mg | INTRAMUSCULAR | Status: DC | PRN

## 2024-04-19 MED ORDER — POLYETHYLENE GLYCOL 3350 17 G PO PACK
17.0000 g | PACK | Freq: Every day | ORAL | Status: DC | PRN
Start: 2024-04-19 — End: 2024-04-20
  Filled 2024-04-19: qty 1

## 2024-04-19 MED ORDER — LACTATED RINGERS IV SOLN: INTRAVENOUS | Status: DC | PRN

## 2024-04-19 MED ORDER — PROPOFOL 500 MG/50ML IV EMUL
INTRAVENOUS | Status: DC | PRN
  Administered 2024-04-19: 100 ug/kg/min via INTRAVENOUS

## 2024-04-19 MED ORDER — OXYCODONE HCL 5 MG PO TABS
5.0000 mg | ORAL_TABLET | ORAL | Status: DC | PRN
  Administered 2024-04-19: 10 mg via ORAL
  Administered 2024-04-19: 5 mg via ORAL
  Administered 2024-04-20 (×3): 10 mg via ORAL
  Filled 2024-04-19 (×4): qty 2
  Filled 2024-04-19: qty 1

## 2024-04-19 MED ORDER — ROPIVACAINE HCL 5 MG/ML IJ SOLN
INTRAMUSCULAR | Status: DC | PRN
Start: 2024-04-19 — End: 2024-04-19
  Administered 2024-04-19: 20 mL via PERINEURAL

## 2024-04-19 MED ORDER — DOCUSATE SODIUM 100 MG PO CAPS
100.0000 mg | ORAL_CAPSULE | Freq: Two times a day (BID) | ORAL | Status: DC
Start: 2024-04-19 — End: 2024-04-20
  Administered 2024-04-19 – 2024-04-20 (×2): 100 mg via ORAL
  Filled 2024-04-19 (×2): qty 1

## 2024-04-19 MED ORDER — EPHEDRINE SULFATE-NACL 50-0.9 MG/10ML-% IV SOSY
PREFILLED_SYRINGE | INTRAVENOUS | Status: DC | PRN
  Administered 2024-04-19: 10 mg via INTRAVENOUS

## 2024-04-19 MED ORDER — MENTHOL 3 MG MT LOZG
1.0000 | LOZENGE | OROMUCOSAL | Status: DC | PRN
  Filled 2024-04-19: qty 9

## 2024-04-19 MED ORDER — DIPHENHYDRAMINE HCL 12.5 MG/5ML PO ELIX: 12.5000 mg | ORAL_SOLUTION | ORAL | Status: DC | PRN

## 2024-04-19 MED ORDER — TRANEXAMIC ACID-NACL 1000-0.7 MG/100ML-% IV SOLN
1000.0000 mg | INTRAVENOUS | Status: AC
  Administered 2024-04-19: 1000 mg via INTRAVENOUS
  Filled 2024-04-19: qty 100

## 2024-04-19 MED ORDER — BUPIVACAINE IN DEXTROSE 0.75-8.25 % IT SOLN
INTRATHECAL | Status: DC | PRN
  Administered 2024-04-19: 2 mL via INTRATHECAL

## 2024-04-19 MED ORDER — SODIUM CHLORIDE 0.9 % IV SOLN: INTRAVENOUS | Status: DC

## 2024-04-19 MED ORDER — STERILE WATER FOR IRRIGATION IR SOLN
Status: DC | PRN
Start: 2024-04-19 — End: 2024-04-19
  Administered 2024-04-19: 2000 mL

## 2024-04-19 MED ORDER — FENTANYL CITRATE PF 50 MCG/ML IJ SOSY
100.0000 ug | PREFILLED_SYRINGE | INTRAMUSCULAR | Status: DC
  Administered 2024-04-19: 100 ug via INTRAVENOUS
  Filled 2024-04-19: qty 2

## 2024-04-19 MED ORDER — DEXAMETHASONE SODIUM PHOSPHATE 10 MG/ML IJ SOLN
8.0000 mg | Freq: Once | INTRAMUSCULAR | Status: AC
  Administered 2024-04-19: 8 mg via INTRAVENOUS

## 2024-04-19 MED ORDER — METOCLOPRAMIDE HCL 5 MG PO TABS: 5.0000 mg | ORAL_TABLET | Freq: Three times a day (TID) | ORAL | Status: DC | PRN

## 2024-04-19 MED ORDER — NEBIVOLOL HCL 5 MG PO TABS
5.0000 mg | ORAL_TABLET | Freq: Every day | ORAL | Status: DC
  Filled 2024-04-19: qty 1

## 2024-04-19 MED ORDER — ACETAMINOPHEN 10 MG/ML IV SOLN
1000.0000 mg | Freq: Four times a day (QID) | INTRAVENOUS | Status: DC
  Administered 2024-04-19: 1000 mg via INTRAVENOUS
  Filled 2024-04-19: qty 100

## 2024-04-19 MED ORDER — PHENYLEPHRINE HCL (PRESSORS) 10 MG/ML IV SOLN
INTRAVENOUS | Status: AC
  Filled 2024-04-19: qty 1

## 2024-04-19 MED ORDER — PANTOPRAZOLE SODIUM 40 MG PO TBEC
40.0000 mg | DELAYED_RELEASE_TABLET | Freq: Every day | ORAL | Status: DC
  Administered 2024-04-20: 40 mg via ORAL
  Filled 2024-04-19: qty 1

## 2024-04-19 MED ORDER — ONDANSETRON HCL 4 MG/2ML IJ SOLN
4.0000 mg | Freq: Four times a day (QID) | INTRAMUSCULAR | Status: DC | PRN
Start: 2024-04-19 — End: 2024-04-20
  Administered 2024-04-20: 4 mg via INTRAVENOUS
  Filled 2024-04-19 (×2): qty 2

## 2024-04-19 MED ORDER — MIDAZOLAM HCL 2 MG/2ML IJ SOLN
2.0000 mg | INTRAMUSCULAR | Status: DC
  Administered 2024-04-19: 1 mg via INTRAVENOUS
  Filled 2024-04-19: qty 2

## 2024-04-19 MED ORDER — ACETAMINOPHEN 500 MG PO TABS
1000.0000 mg | ORAL_TABLET | Freq: Four times a day (QID) | ORAL | Status: AC
  Administered 2024-04-19 – 2024-04-20 (×3): 1000 mg via ORAL
  Filled 2024-04-19 (×3): qty 2

## 2024-04-19 MED ORDER — CEFAZOLIN SODIUM-DEXTROSE 2-4 GM/100ML-% IV SOLN
2.0000 g | INTRAVENOUS | Status: AC
  Administered 2024-04-19: 2 g via INTRAVENOUS
  Filled 2024-04-19: qty 100

## 2024-04-19 MED ORDER — METOCLOPRAMIDE HCL 5 MG/ML IJ SOLN: 5.0000 mg | Freq: Three times a day (TID) | INTRAMUSCULAR | Status: DC | PRN

## 2024-04-19 MED ORDER — LACTATED RINGERS IV SOLN: INTRAVENOUS | Status: DC

## 2024-04-19 MED ORDER — METHOCARBAMOL 1000 MG/10ML IJ SOLN: 500.0000 mg | Freq: Four times a day (QID) | INTRAMUSCULAR | Status: DC | PRN

## 2024-04-19 MED ORDER — CHLORHEXIDINE GLUCONATE 4 % EX SOLN: 1.0000 | CUTANEOUS | 1 refills | Status: DC

## 2024-04-19 MED ORDER — ONDANSETRON HCL 4 MG/2ML IJ SOLN
INTRAMUSCULAR | Status: DC | PRN
  Administered 2024-04-19: 4 mg via INTRAVENOUS

## 2024-04-19 MED ORDER — BUPIVACAINE LIPOSOME 1.3 % IJ SUSP
INTRAMUSCULAR | Status: AC
  Filled 2024-04-19: qty 20

## 2024-04-19 MED ORDER — METHOCARBAMOL 500 MG PO TABS
500.0000 mg | ORAL_TABLET | Freq: Four times a day (QID) | ORAL | Status: DC | PRN
  Administered 2024-04-19 – 2024-04-20 (×2): 500 mg via ORAL
  Filled 2024-04-19 (×2): qty 1

## 2024-04-19 MED ORDER — MUPIROCIN 2 % EX OINT
1.0000 | TOPICAL_OINTMENT | Freq: Two times a day (BID) | CUTANEOUS | 0 refills | Status: AC
Start: 2024-04-19 — End: 2024-05-19

## 2024-04-19 MED ORDER — HYDROMORPHONE HCL 1 MG/ML IJ SOLN
0.5000 mg | INTRAMUSCULAR | Status: DC | PRN
  Administered 2024-04-19: 0.5 mg via INTRAVENOUS
  Filled 2024-04-19: qty 1

## 2024-04-19 MED ORDER — DEXAMETHASONE SODIUM PHOSPHATE 10 MG/ML IJ SOLN
10.0000 mg | Freq: Once | INTRAMUSCULAR | Status: AC
  Administered 2024-04-20: 10 mg via INTRAVENOUS
  Filled 2024-04-19: qty 1

## 2024-04-19 MED ORDER — TRAMADOL HCL 50 MG PO TABS
50.0000 mg | ORAL_TABLET | Freq: Four times a day (QID) | ORAL | Status: DC | PRN
  Administered 2024-04-19: 50 mg via ORAL
  Administered 2024-04-20: 100 mg via ORAL
  Filled 2024-04-19: qty 2
  Filled 2024-04-19: qty 1

## 2024-04-19 MED ORDER — OXYCODONE HCL 5 MG/5ML PO SOLN: 5.0000 mg | Freq: Once | ORAL | Status: DC | PRN

## 2024-04-19 MED ORDER — ONDANSETRON HCL 4 MG PO TABS
4.0000 mg | ORAL_TABLET | Freq: Four times a day (QID) | ORAL | Status: DC | PRN
  Administered 2024-04-20: 4 mg via ORAL
  Filled 2024-04-19: qty 1

## 2024-04-19 MED ORDER — SODIUM CHLORIDE 0.9 % IR SOLN
Status: DC | PRN
  Administered 2024-04-19: 1000 mL

## 2024-04-19 MED ORDER — CEFAZOLIN SODIUM-DEXTROSE 2-4 GM/100ML-% IV SOLN
2.0000 g | Freq: Four times a day (QID) | INTRAVENOUS | Status: AC
  Administered 2024-04-19 (×2): 2 g via INTRAVENOUS
  Filled 2024-04-19 (×2): qty 100

## 2024-04-19 MED ORDER — CHLORHEXIDINE GLUCONATE 0.12 % MT SOLN
15.0000 mL | Freq: Once | OROMUCOSAL | Status: AC
  Administered 2024-04-19: 15 mL via OROMUCOSAL

## 2024-04-19 MED ORDER — RIVAROXABAN 10 MG PO TABS
10.0000 mg | ORAL_TABLET | Freq: Every day | ORAL | Status: DC
  Administered 2024-04-20: 10 mg via ORAL
  Filled 2024-04-19 (×2): qty 1

## 2024-04-19 MED ORDER — PHENYLEPHRINE HCL-NACL 20-0.9 MG/250ML-% IV SOLN
INTRAVENOUS | Status: DC | PRN
Start: 2024-04-19 — End: 2024-04-19
  Administered 2024-04-19: 25 ug/min via INTRAVENOUS

## 2024-04-19 MED ORDER — BUPROPION HCL ER (XL) 300 MG PO TB24
300.0000 mg | ORAL_TABLET | Freq: Every day | ORAL | Status: DC
  Administered 2024-04-20: 300 mg via ORAL
  Filled 2024-04-19: qty 1

## 2024-04-19 MED ORDER — SODIUM CHLORIDE 0.9 % IV SOLN: 12.5000 mg | INTRAVENOUS | Status: DC | PRN

## 2024-04-19 MED ORDER — POVIDONE-IODINE 10 % EX SWAB
2.0000 | Freq: Once | CUTANEOUS | Status: AC
  Administered 2024-04-19: 2 via TOPICAL

## 2024-04-19 MED ORDER — PHENOL 1.4 % MT LIQD: 1.0000 | OROMUCOSAL | Status: DC | PRN

## 2024-04-19 MED ORDER — SODIUM CHLORIDE (PF) 0.9 % IJ SOLN
INTRAMUSCULAR | Status: AC
  Filled 2024-04-19: qty 10

## 2024-04-19 MED ORDER — ORAL CARE MOUTH RINSE: 15.0000 mL | Freq: Once | OROMUCOSAL | Status: AC

## 2024-04-19 MED ORDER — BUPIVACAINE LIPOSOME 1.3 % IJ SUSP: 20.0000 mL | Freq: Once | INTRAMUSCULAR | Status: DC

## 2024-04-19 MED ORDER — SODIUM CHLORIDE 0.9 % IV SOLN: INTRAVENOUS | Status: DC | PRN

## 2024-04-19 MED ORDER — OXYCODONE HCL 5 MG PO TABS: 5.0000 mg | ORAL_TABLET | Freq: Once | ORAL | Status: DC | PRN

## 2024-04-19 MED ORDER — 0.9 % SODIUM CHLORIDE (POUR BTL) OPTIME
TOPICAL | Status: DC | PRN
  Administered 2024-04-19: 1000 mL

## 2024-04-19 MED ORDER — SODIUM CHLORIDE (PF) 0.9 % IJ SOLN
INTRAMUSCULAR | Status: AC
  Filled 2024-04-19: qty 50

## 2024-04-19 MED ORDER — ACETAMINOPHEN 325 MG PO TABS: 325.0000 mg | ORAL_TABLET | Freq: Four times a day (QID) | ORAL | Status: DC | PRN

## 2024-04-19 MED ORDER — SODIUM CHLORIDE (PF) 0.9 % IJ SOLN
INTRAMUSCULAR | Status: DC | PRN
  Administered 2024-04-19: 80 mL

## 2024-04-19 MED ORDER — BISACODYL 10 MG RE SUPP: 10.0000 mg | Freq: Every day | RECTAL | Status: DC | PRN

## 2024-04-19 MED ORDER — FLEET ENEMA RE ENEM: 1.0000 | ENEMA | Freq: Once | RECTAL | Status: DC | PRN

## 2024-04-19 SURGICAL SUPPLY — 44 items
ATTUNE PSFEM LTSZ4 NARCEM KNEE (Femur) IMPLANT
ATTUNE PSRP INSR SZ4 8 KNEE (Insert) IMPLANT
BAG COUNTER SPONGE SURGICOUNT (BAG) IMPLANT
BAG ZIPLOCK 12X15 (MISCELLANEOUS) ×1 IMPLANT
BASEPLATE TIBIAL ROTATING SZ 4 (Knees) IMPLANT
BLADE SAG 18X100X1.27 (BLADE) ×1 IMPLANT
BLADE SAW SGTL 11.0X1.19X90.0M (BLADE) ×1 IMPLANT
BNDG ELASTIC 6INX 5YD STR LF (GAUZE/BANDAGES/DRESSINGS) ×1 IMPLANT
BOWL SMART MIX CTS (DISPOSABLE) ×1 IMPLANT
CEMENT HV SMART SET (Cement) ×2 IMPLANT
COVER SURGICAL LIGHT HANDLE (MISCELLANEOUS) ×1 IMPLANT
CUFF TRNQT CYL 34X4.125X (TOURNIQUET CUFF) ×1 IMPLANT
DERMABOND ADVANCED .7 DNX12 (GAUZE/BANDAGES/DRESSINGS) ×1 IMPLANT
DRAPE U-SHAPE 47X51 STRL (DRAPES) ×1 IMPLANT
DRSG AQUACEL AG ADV 3.5X10 (GAUZE/BANDAGES/DRESSINGS) ×1 IMPLANT
DURAPREP 26ML APPLICATOR (WOUND CARE) ×1 IMPLANT
ELECT PENCIL ROCKER SW 15FT (MISCELLANEOUS) ×1 IMPLANT
ELECT REM PT RETURN 15FT ADLT (MISCELLANEOUS) ×1 IMPLANT
GLOVE BIO SURGEON STRL SZ 6.5 (GLOVE) IMPLANT
GLOVE BIO SURGEON STRL SZ7 (GLOVE) IMPLANT
GLOVE BIO SURGEON STRL SZ8 (GLOVE) ×1 IMPLANT
GLOVE BIOGEL PI IND STRL 7.0 (GLOVE) ×1 IMPLANT
GLOVE BIOGEL PI IND STRL 8 (GLOVE) ×1 IMPLANT
GOWN STRL REUS W/ TWL LRG LVL3 (GOWN DISPOSABLE) ×1 IMPLANT
HOLDER FOLEY CATH W/STRAP (MISCELLANEOUS) ×1 IMPLANT
IMMOBILIZER KNEE 20 (SOFTGOODS) ×1 IMPLANT
IMMOBILIZER KNEE 20 THIGH 36 (SOFTGOODS) ×1 IMPLANT
KIT TURNOVER KIT A (KITS) ×1 IMPLANT
MANIFOLD NEPTUNE II (INSTRUMENTS) ×1 IMPLANT
NS IRRIG 1000ML POUR BTL (IV SOLUTION) ×1 IMPLANT
PACK TOTAL KNEE CUSTOM (KITS) ×1 IMPLANT
PADDING CAST COTTON 6X4 STRL (CAST SUPPLIES) ×2 IMPLANT
PATELLA MEDIAL ATTUN 35MM KNEE (Knees) IMPLANT
PIN STEINMAN FIXATION KNEE (PIN) IMPLANT
PROTECTOR NERVE ULNAR (MISCELLANEOUS) ×1 IMPLANT
SET HNDPC FAN SPRY TIP SCT (DISPOSABLE) ×1 IMPLANT
SUT MNCRL AB 4-0 PS2 18 (SUTURE) ×1 IMPLANT
SUT VIC AB 2-0 CT1 TAPERPNT 27 (SUTURE) ×3 IMPLANT
SUTURE STRATFX 0 PDS 27 VIOLET (SUTURE) ×1 IMPLANT
TOWEL GREEN STERILE FF (TOWEL DISPOSABLE) ×1 IMPLANT
TRAY FOLEY MTR SLVR 16FR STAT (SET/KITS/TRAYS/PACK) ×1 IMPLANT
TUBE SUCTION HIGH CAP CLEAR NV (SUCTIONS) ×1 IMPLANT
WATER STERILE IRR 1000ML POUR (IV SOLUTION) ×2 IMPLANT
WRAP KNEE MAXI GEL POST OP (GAUZE/BANDAGES/DRESSINGS) ×1 IMPLANT

## 2024-04-19 NOTE — TOC Initial Note (Addendum)
 Transition of Care [facility]) - Initial/Assessment Note    Patient Details  Name: [name] MRN: [id number] Date of Birth: [birth year]  [facility] CM/SW Contact:    Bari Leys, RN Phone Number: 04/19/2024, 3:39 PM  Clinical Narrative:  Patient resides at [facility]. NCM called to Raliegh Burgess, SW at [facility] [place] [phone],  to confirm LOC, no answer, vm left with NCM name and phone number requesting call back. DC therapy plans for  [facility]. Level 2 PASRR pending, FL2 updated.       -3:50pm Call received from [place] with [facility], confirmed patient to return to [facility] which is private pay,  confirmed no PASRR requested, will need updated FL2 and DC Summary,                 Patient Goals and CMS Choice            Expected Discharge Plan and Services                         DME Arranged: N/A DME Agency: NA                  Prior Living Arrangements/Services                       Activities of Daily Living   ADL Screening (condition at time of admission) Independently performs ADLs?: Yes (appropriate for developmental age) Is the patient deaf or have difficulty hearing?: Yes Does the patient have difficulty seeing, even when wearing glasses/contacts?: No Does the patient have difficulty concentrating, remembering, or making decisions?: No  Permission Sought/Granted                  Emotional Assessment              Admission diagnosis:  Primary osteoarthritis of left knee [M17.12] Patient Active Problem List   Diagnosis Date Noted   OA (osteoarthritis) of knee 04/19/2024   Primary osteoarthritis of left knee 04/19/2024   Chronic bronchitis (HCC) 10/08/2022   Osteoporosis 03/18/2022   Hx of diverticulitis of colon 06/24/2019   Hx of adenomatous colonic polyps 06/24/2019   Colitis, nonspecific 04/01/2016   Osteopenia 05/08/2015   Malignant neoplasm of upper-outer quadrant of right  breast in female, estrogen receptor positive (HCC) 04/21/2014   Rhinitis, allergic to other allergen 09/23/2011   Allergy  history, penicillin 09/23/2011   Cough 06/26/2011   PCP:  Aldo Hun, MD Pharmacy:   [facility] [phone] [facility], [place] - [address] [place] [address] Phone: [phone] Fax: [phone]     Social Drivers of Health (SDOH) Social History: SDOH Screenings   Food Insecurity: No Food Insecurity (10/08/2021)   Received from [facility]  Social Connections: Unknown (03/22/2022)   Received from [facility], [facility]  Tobacco Use: Medium Risk (04/19/2024)   SDOH Interventions:     Readmission Risk Interventions     No data to display

## 2024-04-19 NOTE — Evaluation (Signed)
 Physical Therapy Evaluation Patient Details Name: [name] MRN: [id number] DOB: Aug 18, 1941 Today's Date: 04/19/2024  History of Present Illness  Pt is 83 yo female s/p L TKA on 04/19/24.  Pt with hx including but not limited to osteoporosis, R breast CA, anemia, HTN, back sx  Clinical Impression  Pt is s/p TKA resulting in the deficits listed below (see PT Problem List). At baseline, pt resides at ILF at [facility] and plans to return to their rehab.  She is very independent and mobile at baseline.  Today, pt able to ambulate 43' with RW and min A.  She had good pain control and quad activation.  Expected to progress well.  Pt will benefit from acute skilled PT to increase their independence and safety with mobility to allow discharge.          If plan is discharge home, recommend the following: A little help with walking and/or transfers;A little help with bathing/dressing/bathroom   Can travel by private vehicle        Equipment Recommendations None recommended by PT  Recommendations for Other Services       Functional Status Assessment Patient has had a recent decline in their functional status and demonstrates the ability to make significant improvements in function in a reasonable and predictable amount of time.     Precautions / Restrictions Precautions Precautions: Fall;Knee Required Braces or Orthoses: Knee Immobilizer - Left Knee Immobilizer - Left: Discontinue once straight leg raise with < 10 degree lag Restrictions Weight Bearing Restrictions Per Provider Order: Yes LLE Weight Bearing Per Provider Order: Weight bearing as tolerated      Mobility  Bed Mobility Overal bed mobility: Needs Assistance Bed Mobility: Supine to Sit     Supine to sit: Supervision, HOB elevated, Used rails          Transfers Overall transfer level: Needs assistance Equipment used: Rolling walker (2 wheels) Transfers: Sit to/from Stand Sit to Stand: Min assist            General transfer comment: cues for hand placement and L LE management    Ambulation/Gait Ambulation/Gait assistance: Min assist Gait Distance (Feet): 60 Feet Assistive device: Rolling walker (2 wheels) Gait Pattern/deviations: Decreased stride length, Step-through pattern Gait velocity: decreased     General Gait Details: cues for RW proximity and sequencing  Stairs            Wheelchair Mobility     Tilt Bed    Modified Rankin (Stroke Patients Only)       Balance Overall balance assessment: Needs assistance Sitting-balance support: No upper extremity supported Sitting balance-Leahy Scale: Good     Standing balance support: Bilateral upper extremity supported, Reliant on assistive device for balance Standing balance-Leahy Scale: Poor Standing balance comment: steady with RW                             Pertinent Vitals/Pain Pain Assessment Pain Assessment: No/denies pain    Home Living Family/patient expects to be discharged to:: Skilled nursing facility Living Arrangements: Alone               Home Equipment: Agricultural consultant (2 wheels);Cane - single point Additional Comments: Pt from ILF at [facility] and plans to return to their rehab    Prior Function Prior Level of Function : Independent/Modified Independent;Driving             Mobility Comments: Very mobile and independent  Extremity/Trunk Assessment   Upper Extremity Assessment Upper Extremity Assessment: Overall WFL for tasks assessed    Lower Extremity Assessment Lower Extremity Assessment: LLE deficits/detail LLE Deficits / Details: Expected post op changes; ROM knee 0 to 75 degrees; MMT: ankle 5/5, knee and hip 3/5 not further tested LLE Sensation: WNL    Cervical / Trunk Assessment Cervical / Trunk Assessment: Normal  Communication        Cognition Arousal: Alert Behavior During Therapy: WFL for tasks assessed/performed   PT - Cognitive impairments:  No apparent impairments                                 Cueing       General Comments      Exercises     Assessment/Plan    PT Assessment Patient needs continued PT services  PT Problem List Decreased strength;Pain;Decreased range of motion;Decreased activity tolerance;Decreased balance;Decreased mobility;Decreased knowledge of use of DME       PT Treatment Interventions DME instruction;Gait training;Therapeutic exercise;Balance training;Stair training;Functional mobility training;Therapeutic activities;Patient/family education;Neuromuscular re-education;Modalities    PT Goals (Current goals can be found in the Care Plan section)  Acute Rehab PT Goals Patient Stated Goal: Rehab at [facility] PT Goal Formulation: With patient/family Time For Goal Achievement: 05/03/24 Potential to Achieve Goals: Good    Frequency 7X/week     Co-evaluation               AM-PAC PT "6 Clicks" Mobility  Outcome Measure Help needed turning from your back to your side while in a flat bed without using bedrails?: A Little Help needed moving from lying on your back to sitting on the side of a flat bed without using bedrails?: A Little Help needed moving to and from a bed to a chair (including a wheelchair)?: A Little Help needed standing up from a chair using your arms (e.g., wheelchair or bedside chair)?: A Little Help needed to walk in hospital room?: A Little Help needed climbing 3-5 steps with a railing? : A Lot 6 Click Score: 17    End of Session Equipment Utilized During Treatment: Gait belt Activity Tolerance: Patient tolerated treatment well Patient left: with chair alarm set;in chair;with call bell/phone within reach;with family/visitor present;with SCD's reapplied Nurse Communication: Mobility status PT Visit Diagnosis: Other abnormalities of gait and mobility (R26.89);Muscle weakness (generalized) (M62.81)    Time: 7829-5621 PT Time Calculation (min) (ACUTE  ONLY): 27 min   Charges:   PT Evaluation $PT Eval Low Complexity: 1 Low PT Treatments $Gait Training: 8-22 mins PT General Charges $$ ACUTE PT VISIT: 1 Visit         Cyd Dowse, PT Acute Rehab [facility] [phone]   Carolynn Citrin 04/19/2024, 4:33 PM

## 2024-04-19 NOTE — Anesthesia Procedure Notes (Signed)
 Spinal  Patient location during procedure: OR Start time: 04/19/2024 9:31 AM End time: 04/19/2024 9:36 AM Reason for block: surgical anesthesia Staffing Performed: anesthesiologist  Anesthesiologist: Earvin Goldberg, MD Performed by: Earvin Goldberg, MD Authorized by: Earvin Goldberg, MD   Preanesthetic Checklist Completed: patient identified, IV checked, site marked, risks and benefits discussed, surgical consent, monitors and equipment checked, pre-op evaluation and timeout performed Spinal Block Patient position: sitting Prep: DuraPrep Patient monitoring: heart rate, cardiac monitor, continuous pulse ox and blood pressure Approach: midline Location: L3-4 Injection technique: single-shot Needle Needle type: Quincke  Needle gauge: 22 G Needle length: 9 cm Assessment Sensory level: T4 Events: CSF return

## 2024-04-19 NOTE — Anesthesia Postprocedure Evaluation (Signed)
 Anesthesia Post Note  Patient: [name]  Procedure(s) Performed: ARTHROPLASTY, KNEE, TOTAL (Left: Knee)     Patient location during evaluation: PACU Anesthesia Type: Spinal Level of consciousness: awake and alert Pain management: pain level controlled Vital Signs Assessment: post-procedure vital signs reviewed and stable Respiratory status: spontaneous breathing, nonlabored ventilation and respiratory function stable Cardiovascular status: blood pressure returned to baseline and stable Postop Assessment: no apparent nausea or vomiting Anesthetic complications: no   No notable events documented.  Last Vitals:  Vitals:   04/19/24 1215 04/19/24 1223  BP: 118/67   Pulse: 75 67  Resp: (!) 23 12  Temp:    SpO2: 97% 98%    Last Pain:  Vitals:   04/19/24 1215  TempSrc:   PainSc: 0-No pain                 Earvin Goldberg

## 2024-04-19 NOTE — TOC PASRR Note (Cosign Needed)
 30 Day PASRR Note   Patient Details  Name: [name] Date of Birth: [birth year]   Transition of Care [facility]) CM/SW Contact:    Bari Leys, RN Phone Number: 04/19/2024, 3:39 PM  To Whom It May Concern:  Please be advised that this patient will require a short-term nursing home stay - anticipated 30 days or less for rehabilitation and strengthening.   The plan is for return home.

## 2024-04-19 NOTE — Interval H&P Note (Signed)
 History and Physical Interval Note:  04/19/2024 7:00 AM  [name]  has presented today for surgery, with the diagnosis of Left Knee Osteoarthritis.  The various methods of treatment have been discussed with the patient and family. After consideration of risks, benefits and other options for treatment, the patient has consented to  Procedure(s): ARTHROPLASTY, KNEE, TOTAL (Left) as a surgical intervention.  The patient's history has been reviewed, patient examined, no change in status, stable for surgery.  I have reviewed the patient's chart and labs.  Questions were answered to the patient's satisfaction.     Samuel Crock Corita Allinson

## 2024-04-19 NOTE — Discharge Instructions (Addendum)
 Frank Aluisio, MD Total Joint Specialist [facility] [address] [place], [place] [address] [phone]  TOTAL KNEE REPLACEMENT POSTOPERATIVE DIRECTIONS    Knee Rehabilitation, Guidelines Following Surgery  Results after knee surgery are often greatly improved when you follow the exercise, range of motion and muscle strengthening exercises prescribed by your doctor. Safety measures are also important to protect the knee from further injury. If any of these exercises cause you to have increased pain or swelling in your knee joint, decrease the amount until you are comfortable again and slowly increase them. If you have problems or questions, call your caregiver or physical therapist for advice.   BLOOD CLOT PREVENTION Take a 10 mg Xarelto once a day for three weeks following surgery. Then take an 81 mg Aspirin once a day for three weeks. Then discontinue Aspirin. You may resume your vitamins/supplements once you have discontinued the Xarelto. Do not take any NSAIDs (Advil, Aleve, Ibuprofen, Meloxicam, etc.) until you have discontinued the Xarelto.   HOME CARE INSTRUCTIONS  Remove items at home which could result in a fall. This includes throw rugs or furniture in walking pathways.  ICE to the affected knee as much as tolerated. Icing helps control swelling. If the swelling is well controlled you will be more comfortable and rehab easier. Continue to use ice on the knee for pain and swelling from surgery. You may notice swelling that will progress down to the foot and ankle. This is normal after surgery. Elevate the leg when you are not up walking on it.    Continue to use the breathing machine which will help keep your temperature down. It is common for your temperature to cycle up and down following surgery, especially at night when you are not up moving around and exerting yourself. The breathing machine keeps your lungs expanded and your temperature  down. Do not place pillow under the operative knee, focus on keeping the knee straight while resting  DIET You may resume your previous home diet once you are discharged from the hospital.  DRESSING / WOUND CARE / SHOWERING Keep your bulky bandage on for 2 days. On the third post-operative day you may remove the Ace bandage and gauze. There is a waterproof adhesive bandage on your skin which will stay in place until your first follow-up appointment. Once you remove this you will not need to place another bandage You may begin showering 3 days following surgery, but do not submerge the incision under water.  ACTIVITY For the first 5 days, the key is rest and control of pain and swelling Do your home exercises twice a day starting on post-operative day 3. On the days you go to physical therapy, just do the home exercises once that day. You should rest, ice and elevate the leg for 50 minutes out of every hour. Get up and walk/stretch for 10 minutes per hour. After 5 days you can increase your activity slowly as tolerated. Walk with your walker as instructed. Use the walker until you are comfortable transitioning to a cane. Walk with the cane in the opposite hand of the operative leg. You may discontinue the cane once you are comfortable and walking steadily. Avoid periods of inactivity such as sitting longer than an hour when not asleep. This helps prevent blood clots.  You may discontinue the knee immobilizer once you are able to perform a straight leg raise while lying down. You may resume a sexual relationship in one month or   when given the OK by your doctor.  You may return to work once you are cleared by your doctor.  Do not drive a car for 6 weeks or until released by your surgeon.  Do not drive while taking narcotics.  TED HOSE STOCKINGS Wear the elastic stockings on both legs for three weeks following surgery during the day. You may remove them at night for sleeping.  WEIGHT  BEARING Weight bearing as tolerated with assist device (walker, cane, etc) as directed, use it as long as suggested by your surgeon or therapist, typically at least 4-6 weeks.  POSTOPERATIVE CONSTIPATION PROTOCOL Constipation - defined medically as fewer than three stools per week and severe constipation as less than one stool per week.  One of the most common issues patients have following surgery is constipation.  Even if you have a regular bowel pattern at home, your normal regimen is likely to be disrupted due to multiple reasons following surgery.  Combination of anesthesia, postoperative narcotics, change in appetite and fluid intake all can affect your bowels.  In order to avoid complications following surgery, here are some recommendations in order to help you during your recovery period.  Colace (docusate) - Pick up an over-the-counter form of Colace or another stool softener and take twice a day as long as you are requiring postoperative pain medications.  Take with a full glass of water daily.  If you experience loose stools or diarrhea, hold the colace until you stool forms back up. If your symptoms do not get better within 1 week or if they get worse, check with your doctor. Dulcolax (bisacodyl) - Pick up over-the-counter and take as directed by the product packaging as needed to assist with the movement of your bowels.  Take with a full glass of water.  Use this product as needed if not relieved by Colace only.  MiraLax (polyethylene glycol) - Pick up over-the-counter to have on hand. MiraLax is a solution that will increase the amount of water in your bowels to assist with bowel movements.  Take as directed and can mix with a glass of water, juice, soda, coffee, or tea. Take if you go more than two days without a movement. Do not use MiraLax more than once per day. Call your doctor if you are still constipated or irregular after using this medication for 7 days in a row.  If you continue  to have problems with postoperative constipation, please contact the office for further assistance and recommendations.  If you experience "the worst abdominal pain ever" or develop nausea or vomiting, please contact the office immediatly for further recommendations for treatment.  ITCHING If you experience itching with your medications, try taking only a single pain pill, or even half a pain pill at a time.  You can also use Benadryl over the counter for itching or also to help with sleep.   MEDICATIONS See your medication summary on the "After Visit Summary" that the nursing staff will review with you prior to discharge.  You may have some home medications which will be placed on hold until you complete the course of blood thinner medication.  It is important for you to complete the blood thinner medication as prescribed by your surgeon.  Continue your approved medications as instructed at time of discharge.  PRECAUTIONS If you experience chest pain or shortness of breath - call 911 immediately for transfer to the hospital emergency department.  If you develop a fever greater that 101 F, purulent   drainage from wound, increased redness or drainage from wound, foul odor from the wound/dressing, or calf pain - CONTACT YOUR SURGEON.                                                   FOLLOW-UP APPOINTMENTS Make sure you keep all of your appointments after your operation with your surgeon and caregivers. You should call the office at the above phone number and make an appointment for approximately two weeks after the date of your surgery or on the date instructed by your surgeon outlined in the "After Visit Summary".  RANGE OF MOTION AND STRENGTHENING EXERCISES  Rehabilitation of the knee is important following a knee injury or an operation. After just a few days of immobilization, the muscles of the thigh which control the knee become weakened and shrink (atrophy). Knee exercises are designed to build up  the tone and strength of the thigh muscles and to improve knee motion. Often times heat used for twenty to thirty minutes before working out will loosen up your tissues and help with improving the range of motion but do not use heat for the first two weeks following surgery. These exercises can be done on a training (exercise) mat, on the floor, on a table or on a bed. Use what ever works the best and is most comfortable for you Knee exercises include:  Leg Lifts - While your knee is still immobilized in a splint or cast, you can do straight leg raises. Lift the leg to 60 degrees, hold for 3 sec, and slowly lower the leg. Repeat 10-20 times 2-3 times daily. Perform this exercise against resistance later as your knee gets better.  Quad and Hamstring Sets - Tighten up the muscle on the front of the thigh (Quad) and hold for 5-10 sec. Repeat this 10-20 times hourly. Hamstring sets are done by pushing the foot backward against an object and holding for 5-10 sec. Repeat as with quad sets.  Leg Slides: Lying on your back, slowly slide your foot toward your buttocks, bending your knee up off the floor (only go as far as is comfortable). Then slowly slide your foot back down until your leg is flat on the floor again. Angel Wings: Lying on your back spread your legs to the side as far apart as you can without causing discomfort.  A rehabilitation program following serious knee injuries can speed recovery and prevent re-injury in the future due to weakened muscles. Contact your doctor or a physical therapist for more information on knee rehabilitation.   POST-OPERATIVE OPIOID TAPER INSTRUCTIONS: It is important to wean off of your opioid medication as soon as possible. If you do not need pain medication after your surgery it is ok to stop day one. Opioids include: Codeine, Hydrocodone(Norco, Vicodin), Oxycodone(Percocet, oxycontin) and hydromorphone amongst others.  Long term and even short term use of opiods can  cause: Increased pain response Dependence Constipation Depression Respiratory depression And more.  Withdrawal symptoms can include Flu like symptoms Nausea, vomiting And more Techniques to manage these symptoms Hydrate well Eat regular healthy meals Stay active Use relaxation techniques(deep breathing, meditating, yoga) Do Not substitute Alcohol to help with tapering If you have been on opioids for less than two weeks and do not have pain than it is ok to stop all together.  Plan to   wean off of opioids This plan should start within one week post op of your joint replacement. Maintain the same interval or time between taking each dose and first decrease the dose.  Cut the total daily intake of opioids by one tablet each day Next start to increase the time between doses. The last dose that should be eliminated is the evening dose.   IF YOU ARE TRANSFERRED TO A SKILLED REHAB FACILITY If the patient is transferred to a skilled rehab facility following release from the hospital, a list of the current medications will be sent to the facility for the patient to continue.  When discharged from the skilled rehab facility, please have the facility set up the patient's Home Health Physical Therapy prior to being released. Also, the skilled facility will be responsible for providing the patient with their medications at time of release from the facility to include their pain medication, the muscle relaxants, and their blood thinner medication. If the patient is still at the rehab facility at time of the two week follow up appointment, the skilled rehab facility will also need to assist the patient in arranging follow up appointment in our office and any transportation needs.  MAKE SURE YOU:  Understand these instructions.  Get help right away if you are not doing well or get worse.   DENTAL ANTIBIOTICS:  In most cases prophylactic antibiotics for Dental procdeures after total joint surgery are  not necessary.  Exceptions are as follows:  1. History of prior total joint infection  2. Severely immunocompromised (Organ Transplant, cancer chemotherapy, Rheumatoid biologic meds such as Humera)  3. Poorly controlled diabetes (A1C &gt; 8.0, blood glucose over 200)  If you have one of these conditions, contact your surgeon for an antibiotic prescription, prior to your dental procedure.    Pick up stool softner and laxative for home use following surgery while on pain medications. Do not submerge incision under water. Please use good hand washing techniques while changing dressing each day. May shower starting three days after surgery. Please use a clean towel to pat the incision dry following showers. Continue to use ice for pain and swelling after surgery. Do not use any lotions or creams on the incision until instructed by your surgeon.  Information on my medicine - XARELTO (Rivaroxaban)  Why was Xarelto prescribed for you? Xarelto was prescribed for you to reduce the risk of blood clots forming after orthopedic surgery. The medical term for these abnormal blood clots is venous thromboembolism (VTE).  What do you need to know about xarelto ? Take your Xarelto ONCE DAILY at the same time every day. You may take it either with or without food.  If you have difficulty swallowing the tablet whole, you may crush it and mix in applesauce just prior to taking your dose.  Take Xarelto exactly as prescribed by your doctor and DO NOT stop taking Xarelto without talking to the doctor who prescribed the medication.  Stopping without other VTE prevention medication to take the place of Xarelto may increase your risk of developing a clot.  After discharge, you should have regular check-up appointments with your healthcare provider that is prescribing your Xarelto.    What do you do if you miss a dose? If you miss a dose, take it as soon as you remember on the same day then  continue your regularly scheduled once daily regimen the next day. Do not take two doses of Xarelto on the same day.   Important   Safety Information A possible side effect of Xarelto is bleeding. You should call your healthcare provider right away if you experience any of the following: Bleeding from an injury or your nose that does not stop. Unusual colored urine (red or dark brown) or unusual colored stools (red or black). Unusual bruising for unknown reasons. A serious fall or if you hit your head (even if there is no bleeding).  Some medicines may interact with Xarelto and might increase your risk of bleeding while on Xarelto. To help avoid this, consult your healthcare provider or pharmacist prior to using any new prescription or non-prescription medications, including herbals, vitamins, non-steroidal anti-inflammatory drugs (NSAIDs) and supplements.  This website has more information on Xarelto: www.xarelto.com.    

## 2024-04-19 NOTE — Progress Notes (Signed)
 Orthopedic Tech Progress Note Patient Details:  [name] [birth year] [id number]  Patient ID: Sheilla Der, female   DOB: 25-Feb-1941, 83 y.o.   MRN: [id number] Attempted to remove patient from CPM in PACU, PACU RN stated patient was about to go upstairs and to wait till patient was upstairs to remove it. Will remove CPM once patient arrives to 3W. Toi Foster 04/19/2024, 3:07 PM

## 2024-04-19 NOTE — Transfer of Care (Signed)
 Immediate Anesthesia Transfer of Care Note  Patient: [name]  Procedure(s) Performed: ARTHROPLASTY, KNEE, TOTAL (Left: Knee)  Patient Location: PACU  Anesthesia Type:Spinal  Level of Consciousness: drowsy and patient cooperative  Airway & Oxygen Therapy: Patient Spontanous Breathing and Patient connected to face mask oxygen  Post-op Assessment: Report given to RN and Post -op Vital signs reviewed and stable  Post vital signs: Reviewed and stable  Last Vitals:  Vitals Value Taken Time  BP    Temp    Pulse 74 04/19/24 1053  Resp 11 04/19/24 1053  SpO2 100 % 04/19/24 1053  Vitals shown include unfiled device data.  Last Pain:  Vitals:   04/19/24 0857  TempSrc:   PainSc: 0-No pain         Complications: No notable events documented.

## 2024-04-19 NOTE — NC FL2 (Signed)
 [place]  MEDICAID FL2 LEVEL OF CARE FORM     IDENTIFICATION  Patient Name: [name] Birthdate: [birth year] Sex: female Admission Date (Current Location): 04/19/2024  [facility] and [place] Number:  Producer, television/film/video and Address:  [facility],  [address] [place] [address]      Provider Number: [id number]  Attending Physician Name and Address:  Liliane Rei, MD  Relative Name and Phone Number:  Nattalie, Santiesteban (Daughter)  [phone] [facility])    Current Level of Care: Hospital Recommended Level of Care: Skilled Nursing Facility Prior Approval Number:    Date Approved/Denied:   PASRR Number: pending  Discharge Plan: SNF    Current Diagnoses: Patient Active Problem List   Diagnosis Date Noted   OA (osteoarthritis) of knee 04/19/2024   Primary osteoarthritis of left knee 04/19/2024   Chronic bronchitis (HCC) 10/08/2022   Osteoporosis 03/18/2022   Hx of diverticulitis of colon 06/24/2019   Hx of adenomatous colonic polyps 06/24/2019   Colitis, nonspecific 04/01/2016   Osteopenia 05/08/2015   Malignant neoplasm of upper-outer quadrant of right breast in female, estrogen receptor positive (HCC) 04/21/2014   Rhinitis, allergic to other allergen 09/23/2011   Allergy  history, penicillin 09/23/2011   Cough 06/26/2011    Orientation RESPIRATION BLADDER Height & Weight     Self, Time, Situation, Place  Normal Continent, External catheter Weight: 66.7 kg Height:  4\' 11"  (149.9 cm)  BEHAVIORAL SYMPTOMS/MOOD NEUROLOGICAL BOWEL NUTRITION STATUS      Continent Diet (regular)  AMBULATORY STATUS COMMUNICATION OF NEEDS Skin   Limited Assist Verbally Surgical wounds (Left Knee Arthroplasty)                       Personal Care Assistance Level of Assistance  Bathing, Feeding, Dressing Bathing Assistance: Limited assistance Feeding assistance: Limited assistance Dressing Assistance: Limited assistance     Functional Limitations Info   Sight, Hearing, Speech Sight Info: Adequate Hearing Info: Adequate Speech Info: Adequate    SPECIAL CARE FACTORS FREQUENCY  PT (By licensed PT), OT (By licensed OT)     PT Frequency: 5x/wk OT Frequency: 5x/wk            Contractures Contractures Info: Not present    Additional Factors Info  Code Status, Allergies, Psychotropic Code Status Info: Full Code Allergies Info: Latex, Sulfamethoxazole-trimethoprim Psychotropic Info: Wellburin XL 24 hr 300mg  po daily         Current Medications (04/19/2024):  This is the current hospital active medication list Current Facility-Administered Medications  Medication Dose Route Frequency Provider Last Rate Last Admin   0.9 %  sodium chloride  infusion   Intravenous Continuous Edmisten, Kristie L, PA       acetaminophen (TYLENOL) tablet 1,000 mg  1,000 mg Oral Q6H Edmisten, Kristie L, PA       [START ON 04/20/2024] acetaminophen (TYLENOL) tablet 325-650 mg  325-650 mg Oral Q6H PRN Edmisten, Kristie L, PA       bisacodyl (DULCOLAX) suppository 10 mg  10 mg Rectal Daily PRN Edmisten, Kristie L, PA       [START ON 04/20/2024] buPROPion (WELLBUTRIN XL) 24 hr tablet 300 mg  300 mg Oral Daily Edmisten, Kristie L, PA       ceFAZolin (ANCEF) IVPB 2g/100 mL premix  2 g Intravenous Q6H Edmisten, Kristie L, PA       [START ON 04/20/2024] dexamethasone  (DECADRON ) injection 10 mg  10 mg Intravenous Once Edmisten, Kristie L, PA  diphenhydrAMINE (BENADRYL) 12.5 MG/5ML elixir 12.5-25 mg  12.5-25 mg Oral Q4H PRN Edmisten, Kristie L, PA       docusate sodium (COLACE) capsule 100 mg  100 mg Oral BID Edmisten, Kristie L, PA       HYDROmorphone (DILAUDID) injection 0.5-1 mg  0.5-1 mg Intravenous Q4H PRN Edmisten, Kristie L, PA       menthol-cetylpyridinium (CEPACOL) lozenge 3 mg  1 lozenge Oral PRN Edmisten, Kristie L, PA       Or   phenol (CHLORASEPTIC) mouth spray 1 spray  1 spray Mouth/Throat PRN Edmisten, Kristie L, PA       methocarbamol (ROBAXIN)  tablet 500 mg  500 mg Oral Q6H PRN Edmisten, Kristie L, PA       Or   methocarbamol (ROBAXIN) injection 500 mg  500 mg Intravenous Q6H PRN Edmisten, Kristie L, PA       metoCLOPramide (REGLAN) tablet 5-10 mg  5-10 mg Oral Q8H PRN Edmisten, Kristie L, PA       Or   metoCLOPramide (REGLAN) injection 5-10 mg  5-10 mg Intravenous Q8H PRN Edmisten, Kristie L, PA       [START ON 04/20/2024] nebivolol (BYSTOLIC) tablet 5 mg  5 mg Oral Daily Edmisten, Kristie L, PA       ondansetron  (ZOFRAN ) tablet 4 mg  4 mg Oral Q6H PRN Edmisten, Kristie L, PA       Or   ondansetron  (ZOFRAN ) injection 4 mg  4 mg Intravenous Q6H PRN Edmisten, Kristie L, PA       oxyCODONE  (Oxy IR/ROXICODONE ) immediate release tablet 5-10 mg  5-10 mg Oral Q4H PRN Edmisten, Kristie L, PA       [START ON 04/20/2024] pantoprazole (PROTONIX) EC tablet 40 mg  40 mg Oral Daily Edmisten, Kristie L, PA       polyethylene glycol (MIRALAX / GLYCOLAX) packet 17 g  17 g Oral Daily PRN Edmisten, Kristie L, PA       [START ON 04/20/2024] rivaroxaban (XARELTO) tablet 10 mg  10 mg Oral Q breakfast Edmisten, Kristie L, PA       sodium phosphate (FLEET) enema 1 enema  1 enema Rectal Once PRN Edmisten, Kristie L, PA       traMADol  (ULTRAM ) tablet 50-100 mg  50-100 mg Oral Q6H PRN Edmisten, Kristie L, PA         Discharge Medications: Please see discharge summary for a list of discharge medications.  Relevant Imaging Results:  Relevant Lab Results:   Additional Information SSN: [id number]  Bari Leys, RN

## 2024-04-19 NOTE — Care Plan (Signed)
 Ortho Bundle Case Management Note  Patient Details  Name: [name] MRN: [id number] Date of Birth: 09-Nov-1941  L TKA 04/19/24.   DCP: Home with daughter for first day, then home to [facility] DME: No needs. Has RW and cane.  PT: [facility]                  DME Arranged:  N/A DME Agency:  NA  HH Arranged:    HH Agency:     Additional Comments: Please contact me with any questions of if this plan should need to change.  Bronwen Canon, CCM [facility] [phone]   04/19/2024, 8:26 AM

## 2024-04-19 NOTE — Progress Notes (Signed)
 Orthopedic Tech Progress Note Patient Details:  [name] Dec 30, 1940 [id number]  CPM Left Knee CPM Left Knee: On Left Knee Flexion (Degrees): 40 Left Knee Extension (Degrees): 10  Post Interventions Patient Tolerated: Well  Toi Foster 04/19/2024, 11:04 AM

## 2024-04-19 NOTE — Op Note (Signed)
 OPERATIVE REPORT-TOTAL KNEE ARTHROPLASTY   Pre-operative diagnosis- Osteoarthritis  Left knee(s)  Post-operative diagnosis- Osteoarthritis Left knee(s)  Procedure-  Left  Total Knee Arthroplasty  Surgeon- Debbie Loft. Marylynn Rigdon, MD  Assistant- Angelo Kennedy, PA-C   Anesthesia-  Adductor canal block and spinal  EBL-25 mL   Drains None  Tourniquet time-  Total Tourniquet Time Documented: Thigh (Left) - 28 minutes Total: Thigh (Left) - 28 minutes     Complications- None  Condition-PACU - hemodynamically stable.   Brief Clinical Note  [name] is a 83 y.o. year old female with end stage OA of her left knee with progressively worsening pain and dysfunction. She has constant pain, with activity and at rest and significant functional deficits with difficulties even with ADLs. She has had extensive non-op management including analgesics, injections of cortisone and viscosupplements, and home exercise program, but remains in significant pain with significant dysfunction. Radiographs show bone on bone arthritis medial and patellofemoral. She presents now for left Total Knee Arthroplasty.     Procedure in detail---   The patient is brought into the operating room and positioned supine on the operating table. After successful administration of  Adductor canal block and spinal,   a tourniquet is placed high on the  Left thigh(s) and the lower extremity is prepped and draped in the usual sterile fashion. Time out is performed by the operating team and then the  Left lower extremity is wrapped in Esmarch, knee flexed and the tourniquet inflated to 300 mmHg.       A midline incision is made with a ten blade through the subcutaneous tissue to the level of the extensor mechanism. A fresh blade is used to make a medial parapatellar arthrotomy. Soft tissue over the proximal medial tibia is subperiosteally elevated to the joint line with a knife and into the semimembranosus bursa with a Cobb  elevator. Soft tissue over the proximal lateral tibia is elevated with attention being paid to avoiding the patellar tendon on the tibial tubercle. The patella is everted, knee flexed 90 degrees and the ACL and PCL are removed. Findings are bone on bone medial and patellofemoral with large global osteophytes        The drill is used to create a starting hole in the distal femur and the canal is thoroughly irrigated with sterile saline to remove the fatty contents. The 5 degree Left  valgus alignment guide is placed into the femoral canal and the distal femoral cutting block is pinned to remove 10 mm off the distal femur. Resection is made with an oscillating saw.      The tibia is subluxed forward and the menisci are removed. The extramedullary alignment guide is placed referencing proximally at the medial aspect of the tibial tubercle and distally along the second metatarsal axis and tibial crest. The block is pinned to remove 2mm off the more deficient medial  side. Resection is made with an oscillating saw. Size 4is the most appropriate size for the tibia and the proximal tibia is prepared with the modular drill and keel punch for that size.      The femoral sizing guide is placed and size 4 is most appropriate. Rotation is marked off the epicondylar axis and confirmed by creating a rectangular flexion gap at 90 degrees. The size 4 cutting block is pinned in this rotation and the anterior, posterior and chamfer cuts are made with the oscillating saw. The intercondylar block is then placed and that cut is made.  Trial size 4 tibial component, trial size 4 narrow posterior stabilized femur and a 8  mm posterior stabilized rotating platform insert trial is placed. Full extension is achieved with excellent varus/valgus and anterior/posterior balance throughout full range of motion. The patella is everted and thickness measured to be 22  mm. Free hand resection is taken to 12 mm, a 35 template is placed, lug  holes are drilled, trial patella is placed, and it tracks normally. Osteophytes are removed off the posterior femur with the trial in place. All trials are removed and the cut bone surfaces prepared with pulsatile lavage. Cement is mixed and once ready for implantation, the size 4 tibial implant, size  4 narrow posterior stabilized femoral component, and the size 35 patella are cemented in place and the patella is held with the clamp. The trial insert is placed and the knee held in full extension. The Exparel (20 ml mixed with 60 ml saline) is injected into the extensor mechanism, posterior capsule, medial and lateral gutters and subcutaneous tissues.  All extruded cement is removed and once the cement is hard the permanent 8 mm posterior stabilized rotating platform insert is placed into the tibial tray.      The wound is copiously irrigated with saline solution and the extensor mechanism closed with # 0 Stratofix suture. The tourniquet is released for a total tourniquet time of 28  minutes. Flexion against gravity is 140 degrees and the patella tracks normally. Subcutaneous tissue is closed with 2.0 vicryl and subcuticular with running 4.0 Monocryl. The incision is cleaned and dried and steri-strips and a bulky sterile dressing are applied. The limb is placed into a knee immobilizer and the patient is awakened and transported to recovery in stable condition.      Please note that a surgical assistant was a medical necessity for this procedure in order to perform it in a safe and expeditious manner. Surgical assistant was necessary to retract the ligaments and vital neurovascular structures to prevent injury to them and also necessary for proper positioning of the limb to allow for anatomic placement of the prosthesis.   Debbie Loft Shamarra Warda, MD    04/19/2024, 10:32 AM

## 2024-04-19 NOTE — Progress Notes (Signed)
 Orthopedic Tech Progress Note Patient Details:  [name] 1941-02-10 [id number]  CPM Left Knee CPM Left Knee: Off Left Knee Flexion (Degrees): 40 Left Knee Extension (Degrees): 10  Post Interventions Patient Tolerated: Well  Toi Foster 04/19/2024, 3:15 PM

## 2024-04-19 NOTE — Anesthesia Procedure Notes (Signed)
 Anesthesia Regional Block: Adductor canal block   Pre-Anesthetic Checklist: , timeout performed,  Correct Patient, Correct Site, Correct Laterality,  Correct Procedure, Correct Position, site marked,  Risks and benefits discussed,  Surgical consent,  Pre-op evaluation,  At surgeon's request and post-op pain management  Laterality: Left  Prep: chloraprep       Needles:  Injection technique: Single-shot  Needle Type: Stimiplex     Needle Length: 9cm  Needle Gauge: 21     Additional Needles:   Procedures:,,,, ultrasound used (permanent image in chart),,    Narrative:  Start time: 04/19/2024 8:54 AM End time: 04/19/2024 8:59 AM Injection made incrementally with aspirations every 5 mL.  Performed by: Personally  Anesthesiologist: Earvin Goldberg, MD

## 2024-04-20 ENCOUNTER — Encounter (HOSPITAL_COMMUNITY): Payer: Self-pay | Admitting: Orthopedic Surgery

## 2024-04-20 DIAGNOSIS — M1712 Unilateral primary osteoarthritis, left knee: Secondary | ICD-10-CM | POA: Diagnosis not present

## 2024-04-20 LAB — BASIC METABOLIC PANEL WITH GFR
Anion gap: 8 (ref 5–15)
BUN: 13 mg/dL (ref 8–23)
CO2: 21 mmol/L — ABNORMAL LOW (ref 22–32)
Calcium: 8.1 mg/dL — ABNORMAL LOW (ref 8.9–10.3)
Chloride: 100 mmol/L (ref 98–111)
Creatinine, Ser: 0.63 mg/dL (ref 0.44–1.00)
GFR, Estimated: >60 mL/min (ref >60)
Glucose, Bld: 164 mg/dL — ABNORMAL HIGH (ref 70–99)
Potassium: 3.8 mmol/L (ref 3.5–5.1)
Sodium: 129 mmol/L — ABNORMAL LOW (ref 135–145)

## 2024-04-20 LAB — CBC
HCT: 30.6 % — ABNORMAL LOW (ref 36.0–46.0)
Hemoglobin: 10.3 g/dL — ABNORMAL LOW (ref 12.0–15.0)
MCH: 31.9 pg (ref 26.0–34.0)
MCHC: 33.7 g/dL (ref 30.0–36.0)
MCV: 94.7 fL (ref 80.0–100.0)
Platelets: 290 10*3/uL (ref 150–400)
RBC: 3.23 MIL/uL — ABNORMAL LOW (ref 3.87–5.11)
RDW: 13.4 % (ref 11.5–15.5)
WBC: 11.4 10*3/uL — ABNORMAL HIGH (ref 4.0–10.5)
nRBC: 0 % (ref 0.0–0.2)

## 2024-04-20 MED ORDER — TRAMADOL HCL 50 MG PO TABS: 50.0000 mg | ORAL_TABLET | Freq: Four times a day (QID) | ORAL | 0 refills | Status: DC | PRN

## 2024-04-20 MED ORDER — RIVAROXABAN 10 MG PO TABS: 10.0000 mg | ORAL_TABLET | Freq: Every day | ORAL | 0 refills | Status: AC

## 2024-04-20 MED ORDER — METHOCARBAMOL 500 MG PO TABS: 500.0000 mg | ORAL_TABLET | Freq: Four times a day (QID) | ORAL | 0 refills | Status: DC | PRN

## 2024-04-20 MED ORDER — OXYCODONE HCL 5 MG PO TABS: 5.0000 mg | ORAL_TABLET | Freq: Four times a day (QID) | ORAL | 0 refills | Status: DC | PRN

## 2024-04-20 MED ORDER — DOCUSATE SODIUM 100 MG PO CAPS: 100.0000 mg | ORAL_CAPSULE | Freq: Two times a day (BID) | ORAL | 0 refills | Status: DC

## 2024-04-20 MED ORDER — ONDANSETRON HCL 4 MG PO TABS
4.0000 mg | ORAL_TABLET | Freq: Four times a day (QID) | ORAL | 0 refills | Status: AC | PRN
Start: 2024-04-20 — End: ?

## 2024-04-20 NOTE — Care Management Obs Status (Signed)
 MEDICARE OBSERVATION STATUS NOTIFICATION   Patient Details  Name: [name] MRN: [id number] Date of Birth: 11/10/1941   Medicare Observation Status Notification Given:  Yes    Bari Leys, RN 04/20/2024, 9:30 AM

## 2024-04-20 NOTE — Progress Notes (Signed)
 Physical Therapy Treatment Patient Details Name: [name] MRN: [id number] DOB: 07-23-1941 Today's Date: 04/20/2024   History of Present Illness Pt is 83 yo female s/p L TKA on 04/19/24.  Pt with hx including but not limited to osteoporosis, R breast CA, anemia, HTN, back sx    PT Comments  Pt had near syncopal episode earlier with nursing but reports feeling better at start of PT session.  Able to perform reclined/seated exercises but upon sitting edge of chair pt with increased lightheadedness and drop in BP.  Tried exercises, time, and deep breathing but symptoms worsened and bp further dropped (87/56).  Had to recline pt and was unable to progress.  Pt's BP improved reclined (129/61).  Notified RN who notified ortho team.  Will f/u in afternoon     If plan is discharge home, recommend the following: A little help with walking and/or transfers;A little help with bathing/dressing/bathroom   Can travel by private vehicle        Equipment Recommendations  None recommended by PT    Recommendations for Other Services       Precautions / Restrictions Precautions Precautions: Fall;Knee Required Braces or Orthoses: Knee Immobilizer - Left Knee Immobilizer - Left: Discontinue once straight leg raise with < 10 degree lag Restrictions LLE Weight Bearing Per Provider Order: Weight bearing as tolerated     Mobility  Bed Mobility               General bed mobility comments: in recliner    Transfers                   General transfer comment: unable to attempt standing as pt becoming orthostatic and nauseated just sitting edge of chair; see comments    Ambulation/Gait                   Stairs             Wheelchair Mobility     Tilt Bed    Modified Rankin (Stroke Patients Only)       Balance Overall balance assessment: Needs assistance Sitting-balance support: No upper extremity supported Sitting balance-Leahy Scale: Good                                       Communication    Cognition Arousal: Alert Behavior During Therapy: WFL for tasks assessed/performed   PT - Cognitive impairments: No apparent impairments                                Cueing    Exercises Total Joint Exercises Ankle Circles/Pumps: AROM, Both, 10 reps, Supine Quad Sets: AROM, Both, 10 reps, Supine Heel Slides: AAROM, Left, 10 reps, Supine Hip ABduction/ADduction: AAROM, Left, 10 reps, Supine    General Comments General comments (skin integrity, edema, etc.): Spoke with Ivette Marks, RN , prior to session and she reports the pt had a rough morning with near syncopal episode in bathroom with nausea but BP were stable at that time, then needing Zofran  and her pain meds.  Pt reclined in chair at arrival with BP 107/59  and reports feeling some better and ready to attempt therapy.  Performed supine exercises in reclined position then sat pt up on edge of chair.  Pt becoming lightheaded and nauseated.  BP was 98/66.  Had  pt take deep breaths and perform AROM exercises of arms.  Took BP again after 3 mins and BP 87/56 HR 60 bpm -pt reports further lightheadedness so was scooted back in chair and reclined.  After 3 mins reclined BP up to 129/61.  Pt feeling better reclined.  Provided with ices packs for head and knee, ginger ale, and cold rag for head.  Daughter present.  Educated pt on orthostatic BP, encouraged fluid and food intake.  Plan to f/u in afternoon.      Pertinent Vitals/Pain Pain Assessment Pain Assessment: 0-10 Pain Score: 6  Pain Location: L knee Pain Descriptors / Indicators: Discomfort Pain Intervention(s): Limited activity within patient's tolerance, Monitored during session, Premedicated before session, Repositioned, Ice applied    Home Living                          Prior Function            PT Goals (current goals can now be found in the care plan section) Progress towards PT goals: Not  progressing toward goals - comment (limited by orthostatic bp)    Frequency    7X/week      PT Plan      Co-evaluation              AM-PAC PT "6 Clicks" Mobility   Outcome Measure  Help needed turning from your back to your side while in a flat bed without using bedrails?: A Little Help needed moving from lying on your back to sitting on the side of a flat bed without using bedrails?: A Little Help needed moving to and from a bed to a chair (including a wheelchair)?: A Little Help needed standing up from a chair using your arms (e.g., wheelchair or bedside chair)?: A Little Help needed to walk in hospital room?: A Lot Help needed climbing 3-5 steps with a railing? : A Lot 6 Click Score: 16    End of Session Equipment Utilized During Treatment: Gait belt Activity Tolerance: Patient limited by pain;Treatment limited secondary to medical complications (Comment) Patient left: in chair;with call bell/phone within reach;with family/visitor present Nurse Communication: Mobility status (orthostatic BP - RN notified ortho team) PT Visit Diagnosis: Other abnormalities of gait and mobility (R26.89);Muscle weakness (generalized) (M62.81)     Time: 1914-7829 PT Time Calculation (min) (ACUTE ONLY): 29 min  Charges:    $Therapeutic Exercise: 8-22 mins $Therapeutic Activity: 8-22 mins PT General Charges $$ ACUTE PT VISIT: 1 Visit                     Cyd Dowse, PT Acute Rehab [facility] [phone]    Carolynn Citrin 04/20/2024, 12:31 PM

## 2024-04-20 NOTE — Discharge Summary (Signed)
 Physician Discharge Summary   Patient ID: [name] MRN: [id number] DOB/AGE: 05-25-1941 83 y.o.  Admit date: 04/19/2024 Discharge date: 04/20/2024  Primary Diagnosis: Osteoarthritis, left knee   Admission Diagnoses:  Past Medical History:  Diagnosis Date   Allergic rhinitis    Allergy     Anemia    Anxiety    Arthritis    Asthma    Blood transfusion without reported diagnosis    Breast cancer (HCC) 2011   right side   Cataract    bil removed   Concussion    COVID    Depression    Diverticulitis    Diverticulosis    Endometriosis    GERD (gastroesophageal reflux disease)    Hypertension    IT band syndrome    Neuromuscular disorder (HCC)    ociptal neuralgia   Osteopenia    Pneumonia    Tubular adenoma of colon    UTI (urinary tract infection)    Discharge Diagnoses:   Principal Problem:   OA (osteoarthritis) of knee Active Problems:   Primary osteoarthritis of left knee  Estimated body mass index is 29.69 kg/m as calculated from the following:   Height as of this encounter: 4\' 11"  (1.499 m).   Weight as of this encounter: 66.7 kg.  Procedure:  Procedure(s) (LRB): ARTHROPLASTY, KNEE, TOTAL (Left)   Consults: None  HPI: [name] is a 83 y.o. year old female with end stage OA of her left knee with progressively worsening pain and dysfunction. She has constant pain, with activity and at rest and significant functional deficits with difficulties even with ADLs. She has had extensive non-op management including analgesics, injections of cortisone and viscosupplements, and home exercise program, but remains in significant pain with significant dysfunction. Radiographs show bone on bone arthritis medial and patellofemoral. She presents now for left Total Knee Arthroplasty.   Laboratory Data: Admission on 04/19/2024  Component Date Value Ref Range Status   WBC 04/20/2024 11.4 (H)  4.0 - 10.5 K/uL Final   RBC 04/20/2024 3.23 (L)  3.87 - 5.11 MIL/uL Final    Hemoglobin 04/20/2024 10.3 (L)  12.0 - 15.0 g/dL Final   HCT 13/06/6577 30.6 (L)  36.0 - 46.0 % Final   MCV 04/20/2024 94.7  80.0 - 100.0 fL Final   MCH 04/20/2024 31.9  26.0 - 34.0 pg Final   MCHC 04/20/2024 33.7  30.0 - 36.0 g/dL Final   RDW 46/96/2952 13.4  11.5 - 15.5 % Final   Platelets 04/20/2024 290  150 - 400 K/uL Final   nRBC 04/20/2024 0.0  0.0 - 0.2 % Final   Performed at [facility], [address], [place], [place] [address]   Sodium 04/20/2024 129 (L)  135 - 145 mmol/L Final   Potassium 04/20/2024 3.8  3.5 - 5.1 mmol/L Final   Chloride 04/20/2024 100  98 - 111 mmol/L Final   CO2 04/20/2024 21 (L)  22 - 32 mmol/L Final   Glucose, Bld 04/20/2024 164 (H)  70 - 99 mg/dL Final   Glucose reference range applies only to samples taken after fasting for at least 8 hours.   BUN 04/20/2024 13  8 - 23 mg/dL Final   Creatinine, Ser 04/20/2024 0.63  0.44 - 1.00 mg/dL Final   Calcium 44/11/270 8.1 (L)  8.9 - 10.3 mg/dL Final   GFR, Estimated 04/20/2024 >60  >60 mL/min Final   Comment: (NOTE) Calculated using the CKD-EPI Creatinine Equation (2021)    Anion gap  04/20/2024 8  5 - 15 Final   Performed at [facility], [address], [place], [place] [address]  Hospital Outpatient Visit on 04/12/2024  Component Date Value Ref Range Status   Color, Urine 04/12/2024 YELLOW  YELLOW Final   APPearance 04/12/2024 CLEAR  CLEAR Final   Specific Gravity, Urine 04/12/2024 1.009  1.005 - 1.030 Final   pH 04/12/2024 6.0  5.0 - 8.0 Final   Glucose, UA 04/12/2024 NEGATIVE  NEGATIVE mg/dL Final   Hgb urine dipstick 04/12/2024 NEGATIVE  NEGATIVE Final   Bilirubin Urine 04/12/2024 NEGATIVE  NEGATIVE Final   Ketones, ur 04/12/2024 NEGATIVE  NEGATIVE mg/dL Final   Protein, ur 56/43/3295 NEGATIVE  NEGATIVE mg/dL Final   Nitrite 18/84/1660 NEGATIVE  NEGATIVE Final   Leukocytes,Ua 04/12/2024 NEGATIVE  NEGATIVE Final   Performed at [facility],  [address] [place], [place] [address]   MRSA, PCR 04/12/2024 NEGATIVE  NEGATIVE Final   Staphylococcus aureus 04/12/2024 POSITIVE (A)  NEGATIVE Final   Comment: (NOTE) The Xpert SA Assay (FDA approved for NASAL specimens in patients 63 years of age and older), is one component of a comprehensive surveillance program. It is not intended to diagnose infection nor to guide or monitor treatment. Performed at [facility] [address] [place], [place] [address]    WBC 04/12/2024 5.9  4.0 - 10.5 K/uL Final   RBC 04/12/2024 4.11  3.87 - 5.11 MIL/uL Final   Hemoglobin 04/12/2024 13.0  12.0 - 15.0 g/dL Final   HCT 23/55/7322 39.3  36.0 - 46.0 % Final   MCV 04/12/2024 95.6  80.0 - 100.0 fL Final   MCH 04/12/2024 31.6  26.0 - 34.0 pg Final   MCHC 04/12/2024 33.1  30.0 - 36.0 g/dL Final   RDW 02/54/2706 13.2  11.5 - 15.5 % Final   Platelets 04/12/2024 365  150 - 400 K/uL Final   nRBC 04/12/2024 0.0  0.0 - 0.2 % Final   Performed at [facility], [address] [place], [place] [address]   Sodium 04/12/2024 127 (L)  135 - 145 mmol/L Final   Potassium 04/12/2024 4.3  3.5 - 5.1 mmol/L Final   Chloride 04/12/2024 95 (L)  98 - 111 mmol/L Final   CO2 04/12/2024 21 (L)  22 - 32 mmol/L Final   Glucose, Bld 04/12/2024 81  70 - 99 mg/dL Final   Glucose reference range applies only to samples taken after fasting for at least 8 hours.   BUN 04/12/2024 11  8 - 23 mg/dL Final   Creatinine, Ser 04/12/2024 0.91  0.44 - 1.00 mg/dL Final   Calcium 83/15/1761 8.6 (L)  8.9 - 10.3 mg/dL Final   GFR, Estimated 04/12/2024 >60  >60 mL/min Final   Comment: (NOTE) Calculated using the CKD-EPI Creatinine Equation (2021)    Anion gap 04/12/2024 11  5 - 15 Final   Performed at [facility], [address], [place], [place] [address]     X-Rays:No results found.  EKG: Orders placed or performed during the hospital encounter of 04/12/24   EKG 12  lead per protocol   EKG 12 lead per protocol     Hospital Course: [name] is a 83 y.o. who was admitted to [facility]. They were brought to the operating room on 04/19/2024 and underwent Procedure(s): ARTHROPLASTY, KNEE, TOTAL.  Patient tolerated the procedure well and was later transferred to the recovery room and then to  the orthopaedic floor for postoperative care. They were given PO and IV analgesics for pain control following their surgery. They were given 24 hours of postoperative antibiotics of  Anti-infectives (From admission, onward)    Start     Dose/Rate Route Frequency Ordered Stop   04/19/24 1600  ceFAZolin (ANCEF) IVPB 2g/100 mL premix        2 g 200 mL/hr over 30 Minutes Intravenous Every 6 hours 04/19/24 1342 04/20/24 0000   04/19/24 0715  ceFAZolin (ANCEF) IVPB 2g/100 mL premix        2 g 200 mL/hr over 30 Minutes Intravenous On call to O.R. 04/19/24 1610 04/19/24 1013      and started on DVT prophylaxis in the form of Xarelto.   PT and OT were ordered for total joint protocol. Discharge planning consulted to help with postop disposition and equipment needs.  Patient had a good night on the evening of surgery. They started to get up OOB with therapy on POD #0. Pt was seen during rounds and was ready for discharge pending therapy completion. Pt was discharged to home later that day in stable condition.  Diet: Regular diet Activity: WBAT Follow-up: in 2 weeks Disposition: Skilled nursing facility Discharged Condition: stable   Discharge Instructions     Call MD / Call 911   Complete by: As directed    If you experience chest pain or shortness of breath, CALL 911 and be transported to the hospital emergency room.  If you develope a fever above 101 F, pus (white drainage) or increased drainage or redness at the wound, or calf pain, call your surgeon's office.   Change dressing   Complete by: As directed    You may remove the bulky bandage (ACE wrap and  gauze) two days after surgery. You will have an adhesive waterproof bandage underneath. Leave this in place until your first follow-up appointment.   Constipation Prevention   Complete by: As directed    Drink plenty of fluids.  Prune juice may be helpful.  You may use a stool softener, such as Colace (over the counter) 100 mg twice a day.  Use MiraLax (over the counter) for constipation as needed.   Diet - low sodium heart healthy   Complete by: As directed    Do not put a pillow under the knee. Place it under the heel.   Complete by: As directed    Driving restrictions   Complete by: As directed    No driving for two weeks   Post-operative opioid taper instructions:   Complete by: As directed    POST-OPERATIVE OPIOID TAPER INSTRUCTIONS: It is important to wean off of your opioid medication as soon as possible. If you do not need pain medication after your surgery it is ok to stop day one. Opioids include: Codeine, Hydrocodone (Norco, Vicodin), Oxycodone (Percocet, oxycontin ) and hydromorphone amongst others.  Long term and even short term use of opiods can cause: Increased pain response Dependence Constipation Depression Respiratory depression And more.  Withdrawal symptoms can include Flu like symptoms Nausea, vomiting And more Techniques to manage these symptoms Hydrate well Eat regular healthy meals Stay active Use relaxation techniques(deep breathing, meditating, yoga) Do Not substitute Alcohol  to help with tapering If you have been on opioids for less than two weeks and do not have pain than it is ok to stop all together.  Plan to wean off of opioids This plan should start within one week post op of your joint replacement. Maintain the  same interval or time between taking each dose and first decrease the dose.  Cut the total daily intake of opioids by one tablet each day Next start to increase the time between doses. The last dose that should be eliminated is the evening  dose.      TED hose   Complete by: As directed    Use stockings (TED hose) for three weeks on both leg(s).  You may remove them at night for sleeping.   Weight bearing as tolerated   Complete by: As directed       Allergies as of 04/20/2024       Reactions   Latex Rash   Sulfamethoxazole-trimethoprim Nausea Only        Medication List     STOP taking these medications    amoxicillin -clavulanate 875-125 MG tablet Commonly known as: AUGMENTIN    CALCIUM MAGNESIUM ZINC PO   CRANBERRY PO   Krill Oil 300 MG Caps   multivitamin capsule   Vitamin D (Ergocalciferol) 1.25 MG (50000 UNIT) Caps capsule Commonly known as: DRISDOL       TAKE these medications    buPROPion 150 MG 24 hr tablet Commonly known as: WELLBUTRIN XL Take 300 mg by mouth daily.   cephALEXin 250 MG capsule Commonly known as: KEFLEX Take 250 mg by mouth 4 (four) times daily. At night 6-8 weeks for preventative UTI   cetirizine 10 MG tablet Commonly known as: ZYRTEC Take 10 mg by mouth at bedtime as needed for allergies.   chlorhexidine 4 % external liquid Commonly known as: HIBICLENS Apply 15 mLs (1 Application total) topically as directed for 30 doses. Use as directed daily for 5 days every other week for 6 weeks.   [employer] SALINE NOSE SPRAY NA Place 1 spray into the nose daily.   cyanocobalamin  1000 MCG/ML injection Commonly known as: VITAMIN B12 Inject 1,000 mcg into the muscle every 3 (three) months. Patient had shot on 5/8.   denosumab  60 MG/ML Sosy injection Commonly known as: PROLIA  Inject 60 mg into the skin every 6 (six) months.   docusate sodium 100 MG capsule Commonly known as: COLACE Take 1 capsule (100 mg total) by mouth 2 (two) times daily.   fexofenadine 180 MG tablet Commonly known as: ALLEGRA Take 180 mg by mouth daily.   fluticasone 50 MCG/ACT nasal spray Commonly known as: FLONASE Place 2 sprays into both nostrils daily.   methocarbamol 500 MG tablet Commonly  known as: ROBAXIN Take 1 tablet (500 mg total) by mouth every 6 (six) hours as needed for muscle spasms.   mupirocin ointment 2 % Commonly known as: BACTROBAN Place 1 Application into the nose 2 (two) times daily for 60 doses. Use as directed 2 times daily for 5 days every other week for 6 weeks.   nebivolol 5 MG tablet Commonly known as: BYSTOLIC Take 5 mg by mouth daily.   omeprazole  20 MG capsule Commonly known as: PRILOSEC TAKE 1 CAPSULE BY MOUTH EVERY DAY   ondansetron  4 MG tablet Commonly known as: ZOFRAN  Take 1 tablet (4 mg total) by mouth every 6 (six) hours as needed for nausea.   ondansetron  8 MG disintegrating tablet Commonly known as: ZOFRAN -ODT Take 8 mg by mouth every 8 (eight) hours as needed for nausea or vomiting.   oxyCODONE  5 MG immediate release tablet Commonly known as: Oxy IR/ROXICODONE  Take 1-2 tablets (5-10 mg total) by mouth every 6 (six) hours as needed for severe pain (pain score 7-10).   polyethylene  glycol powder 17 GM/SCOOP powder Commonly known as: GLYCOLAX/MIRALAX Take 8.5 g by mouth every 3 (three) days.   Probiotic 10 Ultra Strength Caps Take 1 capsule by mouth daily.   rivaroxaban 10 MG Tabs tablet Commonly known as: XARELTO Take 1 tablet (10 mg total) by mouth daily with breakfast for 21 days. Then take one 81 mg aspirin once a day for three weeks. Then discontinue aspirin.   traMADol  50 MG tablet Commonly known as: ULTRAM  Take 1-2 tablets (50-100 mg total) by mouth every 6 (six) hours as needed for moderate pain (pain score 4-6).               Discharge Care Instructions  (From admission, onward)           Start     Ordered   04/20/24 0000  Weight bearing as tolerated        04/20/24 0839   04/20/24 0000  Change dressing       Comments: You may remove the bulky bandage (ACE wrap and gauze) two days after surgery. You will have an adhesive waterproof bandage underneath. Leave this in place until your first follow-up  appointment.   04/20/24 4431            Follow-up Information     Liliane Rei, MD. Go on 05/05/2024.   Specialty: Orthopedic Surgery Why: You are scheduled for a post op appointment Wednesday 05/05/24 at 3:15pm Contact information: [address] [place] [address] [place] [address] [phone]                 Signed: Sharlynn Dear, PA-C Orthopedic Surgery 04/20/2024, 8:39 AM

## 2024-04-20 NOTE — Plan of Care (Signed)
  Problem: Education: Goal: Knowledge of General Education information will improve Description: Including pain rating scale, medication(s)/side effects and non-pharmacologic comfort measures Outcome: Adequate for Discharge   Problem: Health Behavior/Discharge Planning: Goal: Ability to manage health-related needs will improve Outcome: Adequate for Discharge   Problem: Clinical Measurements: Goal: Ability to maintain clinical measurements within normal limits will improve Outcome: Progressing Goal: Will remain free from infection Outcome: Progressing Goal: Diagnostic test results will improve Outcome: Progressing Goal: Respiratory complications will improve Outcome: Progressing Goal: Cardiovascular complication will be avoided Outcome: Progressing   Problem: Activity: Goal: Risk for activity intolerance will decrease Outcome: Progressing   Problem: Nutrition: Goal: Adequate nutrition will be maintained Outcome: Completed/Met   Problem: Coping: Goal: Level of anxiety will decrease Outcome: Progressing   Problem: Elimination: Goal: Will not experience complications related to bowel motility Outcome: Progressing Goal: Will not experience complications related to urinary retention Outcome: Progressing   Problem: Safety: Goal: Ability to remain free from injury will improve Outcome: Progressing   Problem: Skin Integrity: Goal: Risk for impaired skin integrity will decrease Outcome: Progressing   Problem: Education: Goal: Knowledge of the prescribed therapeutic regimen will improve Outcome: Progressing Goal: Individualized Educational Video(s) Outcome: Completed/Met   Problem: Clinical Measurements: Goal: Postoperative complications will be avoided or minimized Outcome: Progressing   Problem: Pain Management: Goal: Pain level will decrease with appropriate interventions Outcome: Progressing   Problem: Skin Integrity: Goal: Will show signs of wound  healing Outcome: Progressing

## 2024-04-20 NOTE — Progress Notes (Signed)
 Physical Therapy Treatment Patient Details Name: ANNA BEAIRD MRN: [id number] DOB: 1941/04/18 Today's Date: 04/20/2024   History of Present Illness Pt is 83 yo female s/p L TKA on 04/19/24.  Pt with hx including but not limited to osteoporosis, R breast CA, anemia, HTN, back sx    PT Comments  Pt doing much better this afternoon.  She had received a fluid bolus since morning session.  Pt reports more alert, no nausea, and no lightheadedness.  BP was stable 130's/70's in all positions.  She was able to ambulate 70' with RW and CGA.  Pt planning to return to [facility] for rehab.  Pt demonstrates safe gait & transfers in order to return home from PT perspective once discharged by MD.  While in hospital, will continue to benefit from PT for skilled therapy to advance mobility and exercises.       If plan is discharge home, recommend the following: A little help with walking and/or transfers;A little help with bathing/dressing/bathroom   Can travel by private vehicle        Equipment Recommendations  None recommended by PT    Recommendations for Other Services       Precautions / Restrictions Precautions Precautions: Fall;Knee Required Braces or Orthoses: Knee Immobilizer - Left Knee Immobilizer - Left: Discontinue once straight leg raise with < 10 degree lag Restrictions LLE Weight Bearing Per Provider Order: Weight bearing as tolerated     Mobility  Bed Mobility               General bed mobility comments: in recliner    Transfers Overall transfer level: Needs assistance Equipment used: Rolling walker (2 wheels) Transfers: Sit to/from Stand Sit to Stand: Contact guard assist           General transfer comment: STS from chair and toilet with CGA for safety.  Cues for hand placement.    Ambulation/Gait Ambulation/Gait assistance: Contact guard assist Gait Distance (Feet): 80 Feet Assistive device: Rolling walker (2 wheels) Gait Pattern/deviations: Step-through  pattern, Decreased stride length, Decreased weight shift to left Gait velocity: decreased     General Gait Details: Steady gait with min cues for RW proximity   Stairs             Wheelchair Mobility     Tilt Bed    Modified Rankin (Stroke Patients Only)       Balance Overall balance assessment: Needs assistance Sitting-balance support: No upper extremity supported Sitting balance-Leahy Scale: Good     Standing balance support: Bilateral upper extremity supported, Reliant on assistive device for balance, No upper extremity supported Standing balance-Leahy Scale: Fair Standing balance comment: RW to ambulate; could static stand without AD                            Communication    Cognition Arousal: Alert Behavior During Therapy: WFL for tasks assessed/performed   PT - Cognitive impairments: No apparent impairments                                Cueing    Exercises Total Joint Exercises Ankle Circles/Pumps: AROM, Both, 10 reps, Supine Quad Sets: AROM, Both, 10 reps, Supine Heel Slides: AAROM, Left, 10 reps, Supine Hip ABduction/ADduction: AAROM, Left, 10 reps, Supine Long Arc Quad: AAROM, Left, 10 reps, Seated Knee Flexion: AAROM, Left, 10 reps, Seated Goniometric ROM: L knee  5 to 60 degrees    General Comments General comments (skin integrity, edema, etc.): Took pt BP reclined, sitting, sitting 3 mins, standing, and post activity - all were 130's/70's and pt with no nausea or lightheadedness. Pt did urinate with therapy - measured at 100 mL.  Notified RN.   Educated on safe ice use, no pivots, car transfers, resting with leg straight, and TED hose during day. Also, encouraged walking every 1-2 hours during day. Educated on HEP with focus on mobility the first weeks. Discussed doing exercises within pain control and if pain increasing could decreased ROM, reps, and stop exercises as needed. Encouraged to perform quad sets and ankle  pumps frequently for blood flow and to promote full knee extension.      Pertinent Vitals/Pain Pain Assessment Pain Assessment: 0-10 Pain Score: 2  Pain Location: L knee Pain Descriptors / Indicators: Discomfort Pain Intervention(s): Limited activity within patient's tolerance, Monitored during session, Premedicated before session, Repositioned, Ice applied    Home Living                          Prior Function            PT Goals (current goals can now be found in the care plan section) Progress towards PT goals: Progressing toward goals    Frequency    7X/week      PT Plan      Co-evaluation              AM-PAC PT "6 Clicks" Mobility   Outcome Measure  Help needed turning from your back to your side while in a flat bed without using bedrails?: A Little Help needed moving from lying on your back to sitting on the side of a flat bed without using bedrails?: A Little Help needed moving to and from a bed to a chair (including a wheelchair)?: A Little Help needed standing up from a chair using your arms (e.g., wheelchair or bedside chair)?: A Little Help needed to walk in hospital room?: A Little Help needed climbing 3-5 steps with a railing? : A Little 6 Click Score: 18    End of Session Equipment Utilized During Treatment: Gait belt Activity Tolerance: Patient tolerated treatment well Patient left: in chair;with call bell/phone within reach;with family/visitor present Nurse Communication: Mobility status;Other (comment) (stable BP, improved moblity - passed therapy to return to [facility]; urinated but only 100 mL) PT Visit Diagnosis: Other abnormalities of gait and mobility (R26.89);Muscle weakness (generalized) (M62.81)     Time: 1610-9604 PT Time Calculation (min) (ACUTE ONLY): 32 min  Charges:    $Gait Training: 8-22 mins $Therapeutic Exercise: 8-22 mins $Therapeutic Activity: 8-22 mins PT General Charges $$ ACUTE PT VISIT: 1  Visit                     Cyd Dowse, PT Acute Rehab Services [facility] [phone]    Carolynn Citrin 04/20/2024, 4:00 PM

## 2024-04-20 NOTE — Progress Notes (Signed)
 Subjective: 1 Day Post-Op Procedure(s) (LRB): ARTHROPLASTY, KNEE, TOTAL (Left) Patient reports pain as mild.   Patient seen in rounds by Dr. France Ina. Patient is well, and has had no acute complaints or problems No issues overnight. Denies chest pain, SOB, or calf pain. Foley catheter removed this AM.  We will continue therapy today, ambulated 60' yesterday.   Objective: Vital signs in last 24 hours: Temp:  [97.5 F (36.4 C)-98 F (36.7 C)] 98 F (36.7 C) (06/10 0600) Pulse Rate:  [65-88] 78 (06/10 0600) Resp:  [10-28] 16 (06/10 0600) BP: (102-156)/(61-97) 110/63 (06/10 0600) SpO2:  [93 %-100 %] 98 % (06/10 0600)  Intake/Output from previous day:  Intake/Output Summary (Last 24 hours) at 04/20/2024 0836 Last data filed at 04/20/2024 0834 Gross per 24 hour  Intake 3706.65 ml  Output 2720 ml  Net 986.65 ml     Intake/Output this shift: Total I/O In: 120 [P.O.:120] Out: -   Labs: Recent Labs    04/20/24 0353  HGB 10.3*   Recent Labs    04/20/24 0353  WBC 11.4*  RBC 3.23*  HCT 30.6*  PLT 290   Recent Labs    04/20/24 0353  NA 129*  K 3.8  CL 100  CO2 21*  BUN 13  CREATININE 0.63  GLUCOSE 164*  CALCIUM 8.1*   No results for input(s): "LABPT", "INR" in the last 72 hours.  Exam: General - Patient is Alert and Oriented Extremity - Neurologically intact Neurovascular intact Sensation intact distally Dorsiflexion/Plantar flexion intact Dressing - dressing C/D/I Motor Function - intact, moving foot and toes well on exam.   Past Medical History:  Diagnosis Date   Allergic rhinitis    Allergy     Anemia    Anxiety    Arthritis    Asthma    Blood transfusion without reported diagnosis    Breast cancer (HCC) 2011   right side   Cataract    bil removed   Concussion    COVID    Depression    Diverticulitis    Diverticulosis    Endometriosis    GERD (gastroesophageal reflux disease)    Hypertension    IT band syndrome    Neuromuscular  disorder (HCC)    ociptal neuralgia   Osteopenia    Pneumonia    Tubular adenoma of colon    UTI (urinary tract infection)     Assessment/Plan: 1 Day Post-Op Procedure(s) (LRB): ARTHROPLASTY, KNEE, TOTAL (Left) Principal Problem:   OA (osteoarthritis) of knee Active Problems:   Primary osteoarthritis of left knee  Estimated body mass index is 29.69 kg/m as calculated from the following:   Height as of this encounter: 4\' 11"  (1.499 m).   Weight as of this encounter: 66.7 kg. Advance diet Up with therapy D/C IV fluids   Patient's anticipated LOS is less than 2 midnights, meeting these requirements: - Lives within 1 hour of care - Has a competent adult at home to recover with post-op recover - NO history of  - Chronic pain requiring opioids  - Diabetes  - Coronary Artery Disease  - Heart failure  - Heart attack  - Stroke  - DVT/VTE  - Cardiac arrhythmia  - Respiratory Failure/COPD  - Renal failure  - Anemia  - Advanced Liver disease     DVT Prophylaxis - Xarelto Weight bearing as tolerated. Continue therapy.  Plan is to go to [facility] after hospital stay. Plan for discharge later today Follow-up in the office in 2  weeks.  The PDMP database was reviewed today prior to any opioid medications being prescribed to this patient.  Sharlynn Dear, PA-C Orthopedic Surgery [phone] 04/20/2024, 8:36 AM

## 2024-04-20 NOTE — TOC Transition Note (Signed)
 Transition of Care [facility]) - Discharge Note   Patient Details  Name: [name] MRN: [id number] Date of Birth: 15-Nov-1940  Transition of [facility]) CM/SW Contact:  Bari Leys, RN Phone Number: 04/20/2024, 10:26 AM   Clinical Narrative:  NCM met with patient and daughter at bedside to review dc therapy and home equipment needs, pt currently resides at [facility] with plans to dc to [facility] onsite [facility].  MOON completed. No further TOC needs identified.      Final next level of care: Skilled Nursing Facility     Patient Goals and CMS Choice Patient states their goals for this hospitalization and ongoing recovery are:: short term rehab at [facility] before return to ILF at [email or web address].gov Compare Post Acute Care list provided to:: Patient Choice offered to / list presented to : Patient [place] ownership interest in [facility].provided to:: Patient    Discharge Placement              [facility] at[facility] Patient to be transferred to facility by: [facility] Name of family member notified: Layani, Foronda (Daughter)  [phone] (Mobile) Patient and family notified of of transfer: 04/20/24  Discharge Plan and Services Additional resources added to the After Visit Summary for                  DME Arranged: N/A DME Agency: NA                  Social Drivers of Health (SDOH) Interventions SDOH Screenings   Food Insecurity: No Food Insecurity (04/19/2024)  Housing: Low Risk  (04/19/2024)  Transportation Needs: No Transportation Needs (04/19/2024)  Utilities: Not At Risk (04/19/2024)  Social Connections: Moderately Integrated (04/19/2024)  Tobacco Use: Medium Risk (04/19/2024)     Readmission Risk Interventions     No data to display

## 2024-04-21 ENCOUNTER — Non-Acute Institutional Stay (SKILLED_NURSING_FACILITY): Payer: Self-pay | Admitting: Orthopedic Surgery

## 2024-04-21 ENCOUNTER — Encounter: Payer: Self-pay | Admitting: Orthopedic Surgery

## 2024-04-21 DIAGNOSIS — M25662 Stiffness of left knee, not elsewhere classified: Secondary | ICD-10-CM | POA: Diagnosis not present

## 2024-04-21 DIAGNOSIS — F330 Major depressive disorder, recurrent, mild: Secondary | ICD-10-CM

## 2024-04-21 DIAGNOSIS — Z9181 History of falling: Secondary | ICD-10-CM | POA: Diagnosis not present

## 2024-04-21 DIAGNOSIS — M810 Age-related osteoporosis without current pathological fracture: Secondary | ICD-10-CM | POA: Diagnosis not present

## 2024-04-21 DIAGNOSIS — M6281 Muscle weakness (generalized): Secondary | ICD-10-CM | POA: Diagnosis not present

## 2024-04-21 DIAGNOSIS — I10 Essential (primary) hypertension: Secondary | ICD-10-CM | POA: Diagnosis not present

## 2024-04-21 DIAGNOSIS — M02162 Postdysenteric arthropathy, left knee: Secondary | ICD-10-CM | POA: Diagnosis not present

## 2024-04-21 DIAGNOSIS — E871 Hypo-osmolality and hyponatremia: Secondary | ICD-10-CM

## 2024-04-21 DIAGNOSIS — M25562 Pain in left knee: Secondary | ICD-10-CM | POA: Diagnosis not present

## 2024-04-21 DIAGNOSIS — N390 Urinary tract infection, site not specified: Secondary | ICD-10-CM | POA: Diagnosis not present

## 2024-04-21 DIAGNOSIS — R262 Difficulty in walking, not elsewhere classified: Secondary | ICD-10-CM | POA: Diagnosis not present

## 2024-04-21 DIAGNOSIS — Z96652 Presence of left artificial knee joint: Secondary | ICD-10-CM

## 2024-04-21 NOTE — Progress Notes (Signed)
 [facility] Room Number: 158/A Place of Service:  SNF (31) Provider:  Arnetha Bhat, NP   Aldo Hun, MD  Patient Care Team: Aldo Hun, MD as PCP - General (Internal Medicine) Arvil Birks, MD as Consulting Physician (Orthopedic Surgery) [place], Jilberto Vanderwall, MD as Consulting Physician (Dermatology)  Extended Emergency Contact Information Primary Emergency Contact: Carter-Spencer,Joellyn Home Phone: [phone] Mobile Phone: [phone] Relation: Daughter  Code Status:  Full code Goals of care: Advanced Directive information    04/19/2024    3:00 PM  Advanced Directives  Does Patient Have a Medical Advance Directive? Yes  Type of Estate agent of [place];Living will  Does patient want to make changes to medical advance directive? No - Patient declined  Copy of Healthcare Power of Attorney in Chart? Yes - validated most recent copy scanned in chart (See row information)     Chief Complaint  Patient presents with   Hospitalization Follow-up    Elective left knee surgery 06/09    HPI:  Pt is a 83 y.o. female seen today for f/u s/p hospitalization 06/09-06/10 due to elective left knee surgery.   She currently resides on the rehab unit at [facility]. PMH: HTN, rhinitis, ischemic colitis, recurrent diverticulitis, OA, osteopenia, right breast cancer s/p lumpectomy and chemo/radiation/HRT 2011, and depression.   06/09 she underwent elective left knee arthroplasty by Dr. France Ina. She tolerated procedure well. Anesthesia was adductor canal block and spinal. EBL 25 cc. She started PT/OT on POD #0. Discharged to [facility] rehab 06/10. She reports pain controlled with oxycodone . She has not had a BM. LBM was 06/07. Xarelto  10 mg x 21 days, then asa daily for DVT prophylaxis. She has seen PT/OT.   Na+ 129 at discharge  HTN- controlled without medication Depression- remains on Wellbutrin  Recurrent UTI- remains on Keflex at bedtime  x 15 days   Past Medical History:  Diagnosis Date   Allergic rhinitis    Allergy     Anemia    Anxiety    Arthritis    Asthma    Blood transfusion without reported diagnosis    Breast cancer (HCC) 2011   right side   Cataract    bil removed   Concussion    COVID    Depression    Diverticulitis    Diverticulosis    Endometriosis    GERD (gastroesophageal reflux disease)    Hypertension    IT band syndrome    Neuromuscular disorder (HCC)    ociptal neuralgia   Osteopenia    Pneumonia    Tubular adenoma of colon    UTI (urinary tract infection)    Past Surgical History:  Procedure Laterality Date   APPENDECTOMY  1988   BACK SURGERY  09/1999   BREAST LUMPECTOMY Right 09/04/2010   w/ Sentinel Node Biopsy   BREAST LUMPECTOMY  2/13086   Fibroadenoma. Left   carpel tunnel Right last five years (04/19/15)   CATARACT EXTRACTION W/ INTRAOCULAR LENS  IMPLANT, BILATERAL Bilateral 05/15/16 and 05/09/16   COLONOSCOPY     LIPOMA EXCISION  08/2002   L shoulder    NASAL SINUS SURGERY  1994   OCCIPITAL NEURECTOMY     Nerve block   POLYPECTOMY     pvd Right    rectal fissure repair  11/1955   TOTAL ABDOMINAL HYSTERECTOMY  1988   TOTAL KNEE ARTHROPLASTY Left 04/19/2024   Procedure: ARTHROPLASTY, KNEE, TOTAL;  Surgeon: Liliane Rei, MD;  Location: [facility];  Service: Orthopedics;  Laterality: Left;   TRIGGER FINGER RELEASE     x3   UPPER GASTROINTESTINAL ENDOSCOPY      Allergies  Allergen Reactions   Latex Rash   Sulfamethoxazole-Trimethoprim Nausea Only    Outpatient Encounter Medications as of 04/21/2024  Medication Sig   buPROPion  (WELLBUTRIN  XL) 150 MG 24 hr tablet Take 300 mg by mouth daily.   cephALEXin (KEFLEX) 250 MG capsule Take 250 mg by mouth 4 (four) times daily. At night 6-8 weeks for preventative UTI   cetirizine (ZYRTEC) 10 MG tablet Take 10 mg by mouth at bedtime as needed for allergies.   chlorhexidine  (HIBICLENS ) 4 % external liquid Apply 15 mLs (1 Application  total) topically as directed for 30 doses. Use as directed daily for 5 days every other week for 6 weeks.   [employer] SALINE NOSE SPRAY NA Place 1 spray into the nose daily.   cyanocobalamin  (VITAMIN B12) 1000 MCG/ML injection Inject 1,000 mcg into the muscle every 3 (three) months. Patient had shot on 5/8.   denosumab  (PROLIA ) 60 MG/ML SOSY injection Inject 60 mg into the skin every 6 (six) months.   docusate sodium  (COLACE) 100 MG capsule Take 1 capsule (100 mg total) by mouth 2 (two) times daily.   fexofenadine (ALLEGRA) 180 MG tablet Take 180 mg by mouth daily.   fluticasone (FLONASE) 50 MCG/ACT nasal spray Place 2 sprays into both nostrils daily.   methocarbamol  (ROBAXIN ) 500 MG tablet Take 1 tablet (500 mg total) by mouth every 6 (six) hours as needed for muscle spasms.   mupirocin  ointment (BACTROBAN ) 2 % Place 1 Application into the nose 2 (two) times daily for 60 doses. Use as directed 2 times daily for 5 days every other week for 6 weeks.   nebivolol  (BYSTOLIC ) 5 MG tablet Take 5 mg by mouth daily.     omeprazole  (PRILOSEC) 20 MG capsule TAKE 1 CAPSULE BY MOUTH EVERY DAY   ondansetron  (ZOFRAN ) 4 MG tablet Take 1 tablet (4 mg total) by mouth every 6 (six) hours as needed for nausea.   ondansetron  (ZOFRAN -ODT) 8 MG disintegrating tablet Take 8 mg by mouth every 8 (eight) hours as needed for nausea or vomiting.   oxyCODONE  (OXY IR/ROXICODONE ) 5 MG immediate release tablet Take 1-2 tablets (5-10 mg total) by mouth every 6 (six) hours as needed for severe pain (pain score 7-10).   polyethylene glycol powder (GLYCOLAX /MIRALAX ) 17 GM/SCOOP powder Take 8.5 g by mouth every 3 (three) days.   Probiotic Product (PROBIOTIC 10 ULTRA STRENGTH) CAPS Take 1 capsule by mouth daily.   rivaroxaban  (XARELTO ) 10 MG TABS tablet Take 1 tablet (10 mg total) by mouth daily with breakfast for 21 days. Then take one 81 mg aspirin once a day for three weeks. Then discontinue aspirin.   traMADol  (ULTRAM ) 50 MG tablet Take  1-2 tablets (50-100 mg total) by mouth every 6 (six) hours as needed for moderate pain (pain score 4-6).   No facility-administered encounter medications on file as of 04/21/2024.    Review of Systems  Constitutional: Negative.   HENT: Negative.    Eyes: Negative.   Respiratory:  Negative for cough and shortness of breath.   Cardiovascular:  Negative for chest pain and leg swelling.  Gastrointestinal:  Positive for constipation. Negative for abdominal distention, abdominal pain, diarrhea, nausea and vomiting.  Genitourinary:  Negative for dysuria, frequency and hematuria.  Musculoskeletal:  Positive for arthralgias, gait problem and joint swelling.  Skin:  Positive for wound.  Neurological:  Positive  for weakness. Negative for dizziness and headaches.  Psychiatric/Behavioral:  Positive for dysphoric mood. Negative for confusion and sleep disturbance. The patient is not nervous/anxious.     Immunization History  Administered Date(s) Administered   Hepatitis A 04/11/2004   Hepatitis B 04/11/2004   IPV 03/11/2004   Influenza Split 09/12/2010, 08/12/2011   Influenza, High Dose Seasonal PF 08/16/2018   Moderna Sars-Covid-2 Vaccination 11/22/2019, 12/23/2019   Pneumococcal Conjugate-13 11/12/1995   Respiratory Syncytial Virus Vaccine ,Recomb Aduvanted(Arexvy ) 09/30/2022   Td 04/12/1995   Tdap 04/11/2004   Zoster Recombinant(Shingrix) 12/25/2017, 02/24/2018   Zoster, Live 11/12/1995   Pertinent  Health Maintenance Due  Topic Date Due   INFLUENZA VACCINE  06/11/2024   DEXA SCAN  Completed   Colonoscopy  Discontinued      06/19/2019    2:29 PM  Fall Risk  (RETIRED) Patient Fall Risk Level Low fall risk   Functional Status Survey:    Vitals:   04/21/24 1636  BP: (!) 147/82  Resp: 18  Temp: 97.8 F (36.6 C)  SpO2: 96%  Weight: 160 lb (72.6 kg)  Height: 4' 11 (1.499 m)   Body mass index is 32.32 kg/m. Physical Exam Vitals reviewed.  Constitutional:      General: She  is not in acute distress. HENT:     Head: Normocephalic.   Eyes:     General:        Right eye: No discharge.        Left eye: No discharge.    Cardiovascular:     Rate and Rhythm: Normal rate and regular rhythm.     Pulses: Normal pulses.     Heart sounds: Normal heart sounds.  Pulmonary:     Effort: Pulmonary effort is normal.     Breath sounds: Normal breath sounds.  Abdominal:     General: There is no distension.     Palpations: Abdomen is soft.     Tenderness: There is no abdominal tenderness.   Musculoskeletal:     Cervical back: Neck supple.     Right lower leg: No edema.     Left lower leg: No edema.     Comments: Left knee limited ROM, mild swelling/bruising near patella, Aquacell CDI   Skin:    General: Skin is warm.     Capillary Refill: Capillary refill takes less than 2 seconds.   Neurological:     General: No focal deficit present.     Mental Status: She is alert and oriented to person, place, and time.     Gait: Gait abnormal.   Psychiatric:        Mood and Affect: Mood normal.     Labs reviewed: Recent Labs    08/15/23 1142 04/12/24 1133 04/20/24 0353  NA 131* 127* 129*  K 4.5 4.3 3.8  CL 96 95* 100  CO2 27 21* 21*  GLUCOSE 94 81 164*  BUN 11 11 13   CREATININE 0.79 0.91 0.63  CALCIUM 9.3 8.6* 8.1*   Recent Labs    08/15/23 1142  AST 20  ALT 16  ALKPHOS 85  BILITOT 0.5  PROT 7.3  ALBUMIN 4.0   Recent Labs    08/15/23 1142 04/12/24 1133 04/20/24 0353  WBC 7.5 5.9 11.4*  NEUTROABS 5.4  --   --   HGB 13.6 13.0 10.3*  HCT 41.6 39.3 30.6*  MCV 94.6 95.6 94.7  PLT 444.0* 365 290   Lab Results  Component Value Date   TSH 3.57 08/15/2023  No results found for: HGBA1C No results found for: CHOL, HDL, LDLCALC, LDLDIRECT, TRIG, CHOLHDL  Significant Diagnostic Results in last 30 days:  No results found.  Assessment/Plan 1. S/P total knee arthroplasty, left (Primary) - 06/09 LTKA by Dr. France Ina - pain  tolerated with oxycodone /tramadol  - cont xarelto  10 mg x 21 days, then asa for DVT prophylaxis - WBAT - LBM 06/07> will increase miralax  BID x 1 day - cont PT/OT  2. Primary hypertension - controlled without medication  3. Mild episode of recurrent major depressive disorder (HCC) - no mood changes - cont Wellbutrin   4. Age-related osteoporosis without current pathological fracture - on Prolia   5. Recurrent urinary tract infection - cont Keflex x 15 days   6. Hyponatremia - asymptomatic - appears chronic per past labs - Na+ 129 04/20/2024 - offer gatorade   Family/ staff Communication: plan discussed with patient and nurse  Labs/tests ordered:  none

## 2024-04-22 ENCOUNTER — Encounter: Payer: Self-pay | Admitting: Adult Health

## 2024-04-22 ENCOUNTER — Non-Acute Institutional Stay (SKILLED_NURSING_FACILITY): Payer: Self-pay | Admitting: Adult Health

## 2024-04-22 DIAGNOSIS — K5901 Slow transit constipation: Secondary | ICD-10-CM

## 2024-04-22 DIAGNOSIS — M02162 Postdysenteric arthropathy, left knee: Secondary | ICD-10-CM | POA: Diagnosis not present

## 2024-04-22 DIAGNOSIS — M7989 Other specified soft tissue disorders: Secondary | ICD-10-CM | POA: Diagnosis not present

## 2024-04-22 DIAGNOSIS — R2681 Unsteadiness on feet: Secondary | ICD-10-CM | POA: Diagnosis not present

## 2024-04-22 DIAGNOSIS — R262 Difficulty in walking, not elsewhere classified: Secondary | ICD-10-CM | POA: Diagnosis not present

## 2024-04-22 DIAGNOSIS — E871 Hypo-osmolality and hyponatremia: Secondary | ICD-10-CM | POA: Diagnosis not present

## 2024-04-22 DIAGNOSIS — Z9181 History of falling: Secondary | ICD-10-CM | POA: Diagnosis not present

## 2024-04-22 DIAGNOSIS — Z96652 Presence of left artificial knee joint: Secondary | ICD-10-CM

## 2024-04-22 DIAGNOSIS — M25662 Stiffness of left knee, not elsewhere classified: Secondary | ICD-10-CM | POA: Diagnosis not present

## 2024-04-22 DIAGNOSIS — M25562 Pain in left knee: Secondary | ICD-10-CM | POA: Diagnosis not present

## 2024-04-22 DIAGNOSIS — M6281 Muscle weakness (generalized): Secondary | ICD-10-CM | POA: Diagnosis not present

## 2024-04-22 NOTE — Progress Notes (Signed)
 Location:  Medical illustrator of Service:  SNF (31) Provider:   Janace Mckusick, ANP [facility] [phone]   Aldo Hun, MD  Patient Care Team: Aldo Hun, MD as PCP - General (Internal Medicine) Arvil Birks, MD as Consulting Physician (Orthopedic Surgery) [place], Amy, MD as Consulting Physician (Dermatology)  Extended Emergency Contact Information Primary Emergency Contact: Carter-Spencer,Izzy Home Phone: [phone] Mobile Phone: [phone] Relation: Daughter  Code Status:  Full Goals of care: Advanced Directive information    04/19/2024    3:00 PM  Advanced Directives  Does Patient Have a Medical Advance Directive? Yes  Type of Estate agent of [place];Living will  Does patient want to make changes to medical advance directive? No - Patient declined  Copy of Healthcare Power of Attorney in Chart? Yes - validated most recent copy scanned in chart (See row information)     Chief Complaint  Patient presents with   Acute Visit    Leg edema left    HPI:  Pt is a 83 y.o. female seen today for an acute visit for left leg edema and pain behind the knee   S/P left total knee arthroplasty on 04/19/24 per Dr Rossie Coon She is on xarelto for DVT prevention and will transition to asa Her daughter is a PA and is helping with her care. She took some miralax and colace and had loose stools and is now drinking gatorade. There is expressed concern about her sodium which was 129 on discharge and tends to get low.  Request made by nurse to assess the knee for DVT  She is working with therapy and is using tramadol  and oxycodone  hoping to de escalate use and change to tylenol over time.   Denies sob or cp Wearing compression hose    Past Medical History:  Diagnosis Date   Allergic rhinitis    Allergy     Anemia    Anxiety    Arthritis    Asthma    Blood transfusion without reported diagnosis    Breast cancer (HCC)  2011   right side   Cataract    bil removed   Concussion    COVID    Depression    Diverticulitis    Diverticulosis    Endometriosis    GERD (gastroesophageal reflux disease)    Hypertension    IT band syndrome    Neuromuscular disorder (HCC)    ociptal neuralgia   Osteopenia    Pneumonia    Tubular adenoma of colon    UTI (urinary tract infection)    Past Surgical History:  Procedure Laterality Date   APPENDECTOMY  1988   BACK SURGERY  09/1999   BREAST LUMPECTOMY Right 09/04/2010   w/ Sentinel Node Biopsy   BREAST LUMPECTOMY  4/03474   Fibroadenoma. Left   carpel tunnel Right last five years (04/19/15)   CATARACT EXTRACTION W/ INTRAOCULAR LENS  IMPLANT, BILATERAL Bilateral 05/15/16 and 05/09/16   COLONOSCOPY     LIPOMA EXCISION  08/2002   L shoulder    NASAL SINUS SURGERY  1994   OCCIPITAL NEURECTOMY     Nerve block   POLYPECTOMY     pvd Right    rectal fissure repair  11/1955   TOTAL ABDOMINAL HYSTERECTOMY  1988   TOTAL KNEE ARTHROPLASTY Left 04/19/2024   Procedure: ARTHROPLASTY, KNEE, TOTAL;  Surgeon: Liliane Rei, MD;  Location: [facility];  Service: Orthopedics;  Laterality: Left;   TRIGGER FINGER RELEASE  x3   UPPER GASTROINTESTINAL ENDOSCOPY      Allergies  Allergen Reactions   Latex Rash   Sulfamethoxazole-Trimethoprim Nausea Only    Outpatient Encounter Medications as of 04/22/2024  Medication Sig   buPROPion (WELLBUTRIN XL) 150 MG 24 hr tablet Take 300 mg by mouth daily.   cephALEXin (KEFLEX) 250 MG capsule Take 250 mg by mouth 4 (four) times daily. At night 6-8 weeks for preventative UTI   cetirizine (ZYRTEC) 10 MG tablet Take 10 mg by mouth at bedtime as needed for allergies.   chlorhexidine (HIBICLENS) 4 % external liquid Apply 15 mLs (1 Application total) topically as directed for 30 doses. Use as directed daily for 5 days every other week for 6 weeks.   [employer] SALINE NOSE SPRAY NA Place 1 spray into the nose daily.   cyanocobalamin  (VITAMIN B12) 1000  MCG/ML injection Inject 1,000 mcg into the muscle every 3 (three) months. Patient had shot on 5/8.   denosumab  (PROLIA ) 60 MG/ML SOSY injection Inject 60 mg into the skin every 6 (six) months.   docusate sodium (COLACE) 100 MG capsule Take 1 capsule (100 mg total) by mouth 2 (two) times daily.   fexofenadine (ALLEGRA) 180 MG tablet Take 180 mg by mouth daily.   fluticasone (FLONASE) 50 MCG/ACT nasal spray Place 2 sprays into both nostrils daily.   methocarbamol (ROBAXIN) 500 MG tablet Take 1 tablet (500 mg total) by mouth every 6 (six) hours as needed for muscle spasms.   mupirocin ointment (BACTROBAN) 2 % Place 1 Application into the nose 2 (two) times daily for 60 doses. Use as directed 2 times daily for 5 days every other week for 6 weeks.   nebivolol (BYSTOLIC) 5 MG tablet Take 5 mg by mouth daily.     omeprazole  (PRILOSEC) 20 MG capsule TAKE 1 CAPSULE BY MOUTH EVERY DAY   ondansetron  (ZOFRAN ) 4 MG tablet Take 1 tablet (4 mg total) by mouth every 6 (six) hours as needed for nausea.   ondansetron  (ZOFRAN -ODT) 8 MG disintegrating tablet Take 8 mg by mouth every 8 (eight) hours as needed for nausea or vomiting.   oxyCODONE  (OXY IR/ROXICODONE ) 5 MG immediate release tablet Take 1-2 tablets (5-10 mg total) by mouth every 6 (six) hours as needed for severe pain (pain score 7-10).   polyethylene glycol powder (GLYCOLAX/MIRALAX) 17 GM/SCOOP powder Take 8.5 g by mouth every 3 (three) days.   Probiotic Product (PROBIOTIC 10 ULTRA STRENGTH) CAPS Take 1 capsule by mouth daily.   rivaroxaban (XARELTO) 10 MG TABS tablet Take 1 tablet (10 mg total) by mouth daily with breakfast for 21 days. Then take one 81 mg aspirin once a day for three weeks. Then discontinue aspirin.   traMADol  (ULTRAM ) 50 MG tablet Take 1-2 tablets (50-100 mg total) by mouth every 6 (six) hours as needed for moderate pain (pain score 4-6).   No facility-administered encounter medications on file as of 04/22/2024.    Review of Systems   Constitutional:  Positive for activity change. Negative for appetite change, chills, diaphoresis, fatigue, fever and unexpected weight change.  HENT:  Negative for congestion.   Respiratory:  Negative for cough, shortness of breath and wheezing.   Cardiovascular:  Positive for leg swelling. Negative for chest pain and palpitations.  Gastrointestinal:  Negative for abdominal distention, abdominal pain, constipation and diarrhea.  Genitourinary:  Negative for difficulty urinating and dysuria.  Musculoskeletal:  Positive for arthralgias, gait problem and joint swelling. Negative for back pain and myalgias.  Neurological:  Negative for dizziness, tremors, seizures, syncope, facial asymmetry, speech difficulty, weakness, light-headedness, numbness and headaches.  Psychiatric/Behavioral:  Negative for agitation, behavioral problems and confusion.     Immunization History  Administered Date(s) Administered   Hepatitis A 04/11/2004   Hepatitis B 04/11/2004   IPV 03/11/2004   Influenza Split 09/12/2010, 08/12/2011   Influenza, High Dose Seasonal PF 08/16/2018   Moderna Sars-Covid-2 Vaccination 11/22/2019, 12/23/2019   Pneumococcal Conjugate-13 11/12/1995   Respiratory Syncytial Virus Vaccine ,Recomb Aduvanted(Arexvy ) 09/30/2022   Td 04/12/1995   Tdap 04/11/2004   Zoster Recombinant(Shingrix) 12/25/2017, 02/24/2018   Zoster, Live 11/12/1995   Pertinent  Health Maintenance Due  Topic Date Due   INFLUENZA VACCINE  06/11/2024   DEXA SCAN  Completed   Colonoscopy  Discontinued      06/19/2019    2:29 PM  Fall Risk  (RETIRED) Patient Fall Risk Level Low fall risk      Data saved with a previous flowsheet row definition   Functional Status Survey:    Vitals:   04/22/24 1626  BP: 134/83  Pulse: 97  Resp: 18  Temp: 98.6 F (37 C)   There is no height or weight on file to calculate BMI. Physical Exam Vitals and nursing note reviewed.  Constitutional:      Appearance: Normal  appearance.   Cardiovascular:     Rate and Rhythm: Normal rate and regular rhythm.  Pulmonary:     Effort: Pulmonary effort is normal. No respiratory distress.     Breath sounds: Normal breath sounds. No wheezing.   Musculoskeletal:        General: No tenderness (neg for calf tenderness or redness or warmth).     Right lower leg: No edema.     Left lower leg: Edema (around the left knee and down to the calf area.) present.   Skin:    General: Skin is warm and dry.     Comments: Left knee dressing CDI small amt of drainage on the dressing  Surrounding bruising which is noted.    Neurological:     Mental Status: She is alert and oriented to person, place, and time. Mental status is at baseline.   Psychiatric:        Mood and Affect: Mood normal.     Labs reviewed: Recent Labs    08/15/23 1142 04/12/24 1133 04/20/24 0353  NA 131* 127* 129*  K 4.5 4.3 3.8  CL 96 95* 100  CO2 27 21* 21*  GLUCOSE 94 81 164*  BUN 11 11 13   CREATININE 0.79 0.91 0.63  CALCIUM 9.3 8.6* 8.1*   Recent Labs    08/15/23 1142  AST 20  ALT 16  ALKPHOS 85  BILITOT 0.5  PROT 7.3  ALBUMIN 4.0   Recent Labs    08/15/23 1142 04/12/24 1133 04/20/24 0353  WBC 7.5 5.9 11.4*  NEUTROABS 5.4  --   --   HGB 13.6 13.0 10.3*  HCT 41.6 39.3 30.6*  MCV 94.6 95.6 94.7  PLT 444.0* 365 290   Lab Results  Component Value Date   TSH 3.57 08/15/2023   No results found for: HGBA1C No results found for: CHOL, HDL, LDLCALC, LDLDIRECT, TRIG, CHOLHDL  Significant Diagnostic Results in last 30 days:  No results found.  Assessment/Plan 1. Leg swelling (Primary) Seems to be what is expected given recent knee surgery Continue compression hose Elevation Monitor for worsening, warmth, redness and/or pain On xarelto and will transition to asa  2. S/P total knee  arthroplasty, left WBAT Working with therapy Recommend pain med prior to therapy  3. Hyponatremia Has had some diarrhea and  is drinking gatorade.  Concerned about sodium Daughter at bedside would like lab recheck   4. Slow transit constipation Colace held Miralax is q 3 days    Family/ staff Communication: discussed with resident and her daughter.   Labs/tests ordered:  CBC BMP Monday 6/16

## 2024-04-23 DIAGNOSIS — R2681 Unsteadiness on feet: Secondary | ICD-10-CM | POA: Diagnosis not present

## 2024-04-23 DIAGNOSIS — M02162 Postdysenteric arthropathy, left knee: Secondary | ICD-10-CM | POA: Diagnosis not present

## 2024-04-24 DIAGNOSIS — M6281 Muscle weakness (generalized): Secondary | ICD-10-CM | POA: Diagnosis not present

## 2024-04-24 DIAGNOSIS — Z9181 History of falling: Secondary | ICD-10-CM | POA: Diagnosis not present

## 2024-04-24 DIAGNOSIS — M02162 Postdysenteric arthropathy, left knee: Secondary | ICD-10-CM | POA: Diagnosis not present

## 2024-04-24 DIAGNOSIS — M25662 Stiffness of left knee, not elsewhere classified: Secondary | ICD-10-CM | POA: Diagnosis not present

## 2024-04-24 DIAGNOSIS — M25562 Pain in left knee: Secondary | ICD-10-CM | POA: Diagnosis not present

## 2024-04-24 DIAGNOSIS — R262 Difficulty in walking, not elsewhere classified: Secondary | ICD-10-CM | POA: Diagnosis not present

## 2024-04-26 ENCOUNTER — Non-Acute Institutional Stay (SKILLED_NURSING_FACILITY): Payer: Self-pay | Admitting: Internal Medicine

## 2024-04-26 ENCOUNTER — Encounter: Payer: Self-pay | Admitting: Internal Medicine

## 2024-04-26 DIAGNOSIS — I10 Essential (primary) hypertension: Secondary | ICD-10-CM

## 2024-04-26 DIAGNOSIS — M810 Age-related osteoporosis without current pathological fracture: Secondary | ICD-10-CM | POA: Diagnosis not present

## 2024-04-26 DIAGNOSIS — N390 Urinary tract infection, site not specified: Secondary | ICD-10-CM

## 2024-04-26 DIAGNOSIS — K5901 Slow transit constipation: Secondary | ICD-10-CM | POA: Diagnosis not present

## 2024-04-26 DIAGNOSIS — M25562 Pain in left knee: Secondary | ICD-10-CM | POA: Diagnosis not present

## 2024-04-26 DIAGNOSIS — R2681 Unsteadiness on feet: Secondary | ICD-10-CM | POA: Diagnosis not present

## 2024-04-26 DIAGNOSIS — F330 Major depressive disorder, recurrent, mild: Secondary | ICD-10-CM | POA: Diagnosis not present

## 2024-04-26 DIAGNOSIS — M25662 Stiffness of left knee, not elsewhere classified: Secondary | ICD-10-CM | POA: Diagnosis not present

## 2024-04-26 DIAGNOSIS — E871 Hypo-osmolality and hyponatremia: Secondary | ICD-10-CM

## 2024-04-26 DIAGNOSIS — Z96652 Presence of left artificial knee joint: Secondary | ICD-10-CM | POA: Diagnosis not present

## 2024-04-26 DIAGNOSIS — R262 Difficulty in walking, not elsewhere classified: Secondary | ICD-10-CM | POA: Diagnosis not present

## 2024-04-26 DIAGNOSIS — M6281 Muscle weakness (generalized): Secondary | ICD-10-CM | POA: Diagnosis not present

## 2024-04-26 DIAGNOSIS — Z9181 History of falling: Secondary | ICD-10-CM | POA: Diagnosis not present

## 2024-04-26 DIAGNOSIS — M02162 Postdysenteric arthropathy, left knee: Secondary | ICD-10-CM | POA: Diagnosis not present

## 2024-04-26 NOTE — Progress Notes (Signed)
 Provider:   Location:  Medical illustrator of Service:  SNF (31)  PCP: Aldo Hun, MD Patient Care Team: Aldo Hun, MD as PCP - General (Internal Medicine) Arvil Birks, MD as Consulting Physician (Orthopedic Surgery) [place], Amy, MD as Consulting Physician (Dermatology)  Extended Emergency Contact Information Primary Emergency Contact: Carter-Spencer,Keviana Home Phone: [phone] Mobile Phone: [phone] Relation: Daughter  Code Status: Full [place] Goals of Care: Advanced Directive information    04/19/2024    3:00 PM  Advanced Directives  Does Patient Have a Medical Advance Directive? Yes  Type of Estate agent of [place];Living will  Does patient want to make changes to medical advance directive? No - Patient declined  Copy of Healthcare Power of Attorney in Chart? Yes - validated most recent copy scanned in chart (See row information)      Chief Complaint  Patient presents with   New Admit To SNF    HPI: Patient is a 83 y.o. female seen today for admission to SNF and Rehab  Patient was admitted in the hospital from 6/9 to 610 due to elective left knee surgery. Patient has a history of hypertension, recurrent diverticulitis, UTI, osteopenia, right breast cancer s/p lumpectomy and depression  She underwent elective left knee arthroplasty by Dr. Rossie Coon Patient is now in rehab Patient's pain seems to be controlled with tramadol  and Tylenol  and Robaxin .  Patient is not taking any oxycodone .   She is doing well able to get up and do her ADLs.  Her plan is to eventually go home by end of this week.   She wanted to know how she should taper herself off the tramadol . Her bowels are moving actually she had diarrhea she is controlling that with her diet modification Her swelling is better with ice. Discussed with the daughter who was on the phone   Past Medical History:  Diagnosis Date   Allergic rhinitis    Allergy      Anemia    Anxiety    Arthritis    Asthma    Blood transfusion without reported diagnosis    Breast cancer (HCC) 2011   right side   Cataract    bil removed   Concussion    COVID    Depression    Diverticulitis    Diverticulosis    Endometriosis    GERD (gastroesophageal reflux disease)    Hypertension    IT band syndrome    Neuromuscular disorder (HCC)    ociptal neuralgia   Osteopenia    Pneumonia    Tubular adenoma of colon    UTI (urinary tract infection)    Past Surgical History:  Procedure Laterality Date   APPENDECTOMY  1988   BACK SURGERY  09/1999   BREAST LUMPECTOMY Right 09/04/2010   w/ Sentinel Node Biopsy   BREAST LUMPECTOMY  8/46962   Fibroadenoma. Left   carpel tunnel Right last five years (04/19/15)   CATARACT EXTRACTION W/ INTRAOCULAR LENS  IMPLANT, BILATERAL Bilateral 05/15/16 and 05/09/16   COLONOSCOPY     LIPOMA EXCISION  08/2002   L shoulder    NASAL SINUS SURGERY  1994   OCCIPITAL NEURECTOMY     Nerve block   POLYPECTOMY     pvd Right    rectal fissure repair  11/1955   TOTAL ABDOMINAL HYSTERECTOMY  1988   TOTAL KNEE ARTHROPLASTY Left 04/19/2024   Procedure: ARTHROPLASTY, KNEE, TOTAL;  Surgeon: Liliane Rei, MD;  Location: [facility];  Service: Orthopedics;  Laterality:  Left;   TRIGGER FINGER RELEASE     x3   UPPER GASTROINTESTINAL ENDOSCOPY      reports that she quit smoking about 45 years ago. Her smoking use included cigarettes. She started smoking about 55 years ago. She has a 10 pack-year smoking history. She has never used smokeless tobacco. She reports current alcohol  use. She reports that she does not use drugs. Social History   Socioeconomic History   Marital status: Widowed    Spouse name: Not on file   Number of children: 2   Years of education: Not on file   Highest education level: Not on file  Occupational History   Occupation: fundraising consultant-retired  Tobacco Use   Smoking status: Former    Current packs/day: 0.00     Average packs/day: 1 pack/day for 10.0 years (10.0 ttl pk-yrs)    Types: Cigarettes    Start date: 11/11/1968    Quit date: 11/11/1978    Years since quitting: 45.4   Smokeless tobacco: Never  Vaping Use   Vaping status: Never Used  Substance and Sexual Activity   Alcohol  use: Yes    Comment: social   Drug use: No   Sexual activity: Not Currently    Partners: Male    Birth control/protection: Surgical    Comment: hysterectomy, menarche 83yo, sexual debut 83yo  Other Topics Concern   Not on file  Social History Narrative   Not on file   Social Drivers of Health   Financial Resource Strain: Not on file  Food Insecurity: No Food Insecurity (04/19/2024)   Hunger Vital Sign    Worried About Running Out of Food in the Last Year: Never true    Ran Out of Food in the Last Year: Never true  Transportation Needs: No Transportation Needs (04/19/2024)   PRAPARE - Administrator, Civil Service (Medical): No    Lack of Transportation (Non-Medical): No  Physical Activity: Not on file  Stress: Not on file  Social Connections: Moderately Integrated (04/19/2024)   Social Connection and Isolation Panel    Frequency of Communication with Friends and Family: More than three times a week    Frequency of Social Gatherings with Friends and Family: More than three times a week    Attends Religious Services: More than 4 times per year    Active Member of [employer] or Organizations: Yes    Attends Banker Meetings: More than 4 times per year    Marital Status: Widowed  Intimate Partner Violence: Not At Risk (04/19/2024)   Humiliation, Afraid, Rape, and Kick questionnaire    Fear of Current or Ex-Partner: No    Emotionally Abused: No    Physically Abused: No    Sexually Abused: No    Functional Status Survey:    Family History  Problem Relation Age of Onset   Heart disease Mother    Heart disease Father    Lung cancer Father        smoker   Heart attack Father    Melanoma  Daughter    Colon cancer Neg Hx    Colon polyps Neg Hx    Pancreatic cancer Neg Hx    Esophageal cancer Neg Hx    Rectal cancer Neg Hx    Stomach cancer Neg Hx     Health Maintenance  Topic Date Due   DTaP/Tdap/Td (3 - Td or Tdap) 04/11/2014   COVID-19 Vaccine (4 - 2024-25 season) 07/13/2023   INFLUENZA VACCINE  06/11/2024   Medicare Annual Wellness (AWV)  07/21/2024   Pneumococcal Vaccine: 50+ Years (3 of 3 - PCV20 or PCV21) 07/21/2028   DEXA SCAN  Completed   Zoster Vaccines- Shingrix  Completed   HPV VACCINES  Aged Out   Meningococcal B Vaccine  Aged Out   Colonoscopy  Discontinued    Allergies  Allergen Reactions   Latex Rash   Sulfamethoxazole-Trimethoprim Nausea Only    Outpatient Encounter Medications as of 04/26/2024  Medication Sig   buPROPion  (WELLBUTRIN  XL) 150 MG 24 hr tablet Take 300 mg by mouth daily.   cephALEXin (KEFLEX) 250 MG capsule Take 250 mg by mouth at bedtime. X 15 days   chlorhexidine  (HIBICLENS ) 4 % external liquid Apply 15 mLs (1 Application total) topically as directed for 30 doses. Use as directed daily for 5 days every other week for 6 weeks.   [employer]S SALINE NOSE SPRAY NA Place 1 spray into the nose daily.   cyanocobalamin  (VITAMIN B12) 1000 MCG/ML injection Inject 1,000 mcg into the muscle every 3 (three) months. Patient had shot on 5/8.   denosumab  (PROLIA ) 60 MG/ML SOSY injection Inject 60 mg into the skin every 6 (six) months.   docusate sodium  (COLACE) 100 MG capsule Take 1 capsule (100 mg total) by mouth 2 (two) times daily.   fexofenadine (ALLEGRA) 180 MG tablet Take 180 mg by mouth daily.   fluticasone (FLONASE) 50 MCG/ACT nasal spray Place 2 sprays into both nostrils daily.   methocarbamol  (ROBAXIN ) 500 MG tablet Take 1 tablet (500 mg total) by mouth every 6 (six) hours as needed for muscle spasms.   mupirocin  ointment (BACTROBAN ) 2 % Place 1 Application into the nose 2 (two) times daily for 60 doses. Use as directed 2 times daily for 5 days  every other week for 6 weeks.   nebivolol  (BYSTOLIC ) 5 MG tablet Take 5 mg by mouth daily.     omeprazole  (PRILOSEC) 20 MG capsule TAKE 1 CAPSULE BY MOUTH EVERY DAY   ondansetron  (ZOFRAN ) 4 MG tablet Take 1 tablet (4 mg total) by mouth every 6 (six) hours as needed for nausea.   ondansetron  (ZOFRAN -ODT) 8 MG disintegrating tablet Take 8 mg by mouth every 8 (eight) hours as needed for nausea or vomiting.   oxyCODONE  (OXY IR/ROXICODONE ) 5 MG immediate release tablet Take 1-2 tablets (5-10 mg total) by mouth every 6 (six) hours as needed for severe pain (pain score 7-10).   polyethylene glycol powder (GLYCOLAX /MIRALAX ) 17 GM/SCOOP powder Take 8.5 g by mouth every 3 (three) days.   Probiotic Product (PROBIOTIC 10 ULTRA STRENGTH) CAPS Take 1 capsule by mouth daily.   rivaroxaban  (XARELTO ) 10 MG TABS tablet Take 1 tablet (10 mg total) by mouth daily with breakfast for 21 days. Then take one 81 mg aspirin once a day for three weeks. Then discontinue aspirin.   traMADol  (ULTRAM ) 50 MG tablet Take 1-2 tablets (50-100 mg total) by mouth every 6 (six) hours as needed for moderate pain (pain score 4-6).   No facility-administered encounter medications on file as of 04/26/2024.    Review of Systems  Constitutional:  Negative for activity change and appetite change.  HENT: Negative.    Respiratory:  Negative for cough and shortness of breath.   Cardiovascular:  Negative for leg swelling.  Gastrointestinal:  Negative for constipation.  Genitourinary: Negative.   Musculoskeletal:  Positive for gait problem. Negative for arthralgias and myalgias.  Skin: Negative.   Neurological:  Negative for dizziness and weakness.  Psychiatric/Behavioral:  Negative for confusion, dysphoric mood and sleep disturbance.     Vitals:   04/26/24 1950  BP: (!) 142/85  Pulse: 94  Resp: 14  Temp: 97.9 F (36.6 C)   There is no height or weight on file to calculate BMI. Physical Exam Vitals reviewed.  Constitutional:       Appearance: Normal appearance.  HENT:     Head: Normocephalic.     Nose: Nose normal.     Mouth/Throat:     Mouth: Mucous membranes are moist.     Pharynx: Oropharynx is clear.   Eyes:     Pupils: Pupils are equal, round, and reactive to light.    Cardiovascular:     Rate and Rhythm: Normal rate and regular rhythm.     Pulses: Normal pulses.     Heart sounds: Normal heart sounds. No murmur heard. Pulmonary:     Effort: Pulmonary effort is normal.     Breath sounds: Normal breath sounds.  Abdominal:     General: Abdomen is flat. Bowel sounds are normal.     Palpations: Abdomen is soft.   Musculoskeletal:        General: No swelling.     Cervical back: Neck supple.     Comments: Mild swelling    Skin:    General: Skin is warm.   Neurological:     General: No focal deficit present.     Mental Status: She is alert and oriented to person, place, and time.   Psychiatric:        Mood and Affect: Mood normal.        Thought Content: Thought content normal.     Labs reviewed: Basic Metabolic Panel: Recent Labs    08/15/23 1142 04/12/24 1133 04/20/24 0353  NA 131* 127* 129*  K 4.5 4.3 3.8  CL 96 95* 100  CO2 27 21* 21*  GLUCOSE 94 81 164*  BUN 11 11 13   CREATININE 0.79 0.91 0.63  CALCIUM 9.3 8.6* 8.1*   Liver Function Tests: Recent Labs    08/15/23 1142  AST 20  ALT 16  ALKPHOS 85  BILITOT 0.5  PROT 7.3  ALBUMIN 4.0   No results for input(s): LIPASE, AMYLASE in the last 8760 hours. No results for input(s): AMMONIA in the last 8760 hours. CBC: Recent Labs    08/15/23 1142 04/12/24 1133 04/20/24 0353  WBC 7.5 5.9 11.4*  NEUTROABS 5.4  --   --   HGB 13.6 13.0 10.3*  HCT 41.6 39.3 30.6*  MCV 94.6 95.6 94.7  PLT 444.0* 365 290   Cardiac Enzymes: No results for input(s): CKTOTAL, CKMB, CKMBINDEX, TROPONINI in the last 8760 hours. BNP: Invalid input(s): POCBNP No results found for: HGBA1C Lab Results  Component Value Date    TSH 3.57 08/15/2023   No results found for: VITAMINB12 No results found for: FOLATE No results found for: IRON, TIBC, FERRITIN  Imaging and Procedures obtained prior to SNF admission: No results found.  Assessment/Plan 1. S/P total knee arthroplasty, left (Primary) Doing well I recommended to schedule Tylenol  TID Tramadol  BID and she can use robaxin  and Tramadol  PRN Walking and doing her ADLS Plan to go home by end of this week  2. Hyponatremia Sodium today 130 Recommend drinking less water  and take salt  3. Slow transit constipation Doing well with Colace  4. Primary hypertension Nebivolol   5. Mild episode of recurrent major depressive disorder (HCC) Wellbutrin   6. Age-related osteoporosis without current pathological  fracture Prolia  Per PCP  7. Recurrent urinary tract infection On Keflex  8 Anemia Post Op Hgb today is 11.4   Family/ staff Communication:   Labs/tests ordered:

## 2024-04-27 DIAGNOSIS — M25662 Stiffness of left knee, not elsewhere classified: Secondary | ICD-10-CM | POA: Diagnosis not present

## 2024-04-27 DIAGNOSIS — R2681 Unsteadiness on feet: Secondary | ICD-10-CM | POA: Diagnosis not present

## 2024-04-27 DIAGNOSIS — R262 Difficulty in walking, not elsewhere classified: Secondary | ICD-10-CM | POA: Diagnosis not present

## 2024-04-27 DIAGNOSIS — M6281 Muscle weakness (generalized): Secondary | ICD-10-CM | POA: Diagnosis not present

## 2024-04-27 DIAGNOSIS — M02162 Postdysenteric arthropathy, left knee: Secondary | ICD-10-CM | POA: Diagnosis not present

## 2024-04-27 DIAGNOSIS — M25562 Pain in left knee: Secondary | ICD-10-CM | POA: Diagnosis not present

## 2024-04-27 DIAGNOSIS — Z9181 History of falling: Secondary | ICD-10-CM | POA: Diagnosis not present

## 2024-04-28 DIAGNOSIS — R262 Difficulty in walking, not elsewhere classified: Secondary | ICD-10-CM | POA: Diagnosis not present

## 2024-04-28 DIAGNOSIS — M02162 Postdysenteric arthropathy, left knee: Secondary | ICD-10-CM | POA: Diagnosis not present

## 2024-04-28 DIAGNOSIS — M25662 Stiffness of left knee, not elsewhere classified: Secondary | ICD-10-CM | POA: Diagnosis not present

## 2024-04-28 DIAGNOSIS — M25562 Pain in left knee: Secondary | ICD-10-CM | POA: Diagnosis not present

## 2024-04-28 DIAGNOSIS — R2681 Unsteadiness on feet: Secondary | ICD-10-CM | POA: Diagnosis not present

## 2024-04-28 DIAGNOSIS — Z9181 History of falling: Secondary | ICD-10-CM | POA: Diagnosis not present

## 2024-04-28 DIAGNOSIS — M6281 Muscle weakness (generalized): Secondary | ICD-10-CM | POA: Diagnosis not present

## 2024-04-29 ENCOUNTER — Non-Acute Institutional Stay (SKILLED_NURSING_FACILITY): Payer: Self-pay | Admitting: Orthopedic Surgery

## 2024-04-29 DIAGNOSIS — Z96652 Presence of left artificial knee joint: Secondary | ICD-10-CM

## 2024-04-29 DIAGNOSIS — N390 Urinary tract infection, site not specified: Secondary | ICD-10-CM | POA: Diagnosis not present

## 2024-04-29 DIAGNOSIS — M810 Age-related osteoporosis without current pathological fracture: Secondary | ICD-10-CM | POA: Diagnosis not present

## 2024-04-29 DIAGNOSIS — F330 Major depressive disorder, recurrent, mild: Secondary | ICD-10-CM

## 2024-04-29 DIAGNOSIS — E871 Hypo-osmolality and hyponatremia: Secondary | ICD-10-CM

## 2024-04-29 DIAGNOSIS — M02162 Postdysenteric arthropathy, left knee: Secondary | ICD-10-CM | POA: Diagnosis not present

## 2024-04-29 DIAGNOSIS — Z9181 History of falling: Secondary | ICD-10-CM | POA: Diagnosis not present

## 2024-04-29 DIAGNOSIS — I10 Essential (primary) hypertension: Secondary | ICD-10-CM | POA: Diagnosis not present

## 2024-04-29 DIAGNOSIS — M25662 Stiffness of left knee, not elsewhere classified: Secondary | ICD-10-CM | POA: Diagnosis not present

## 2024-04-29 DIAGNOSIS — M25562 Pain in left knee: Secondary | ICD-10-CM | POA: Diagnosis not present

## 2024-04-29 DIAGNOSIS — M6281 Muscle weakness (generalized): Secondary | ICD-10-CM | POA: Diagnosis not present

## 2024-04-29 DIAGNOSIS — R262 Difficulty in walking, not elsewhere classified: Secondary | ICD-10-CM | POA: Diagnosis not present

## 2024-04-29 DIAGNOSIS — R2681 Unsteadiness on feet: Secondary | ICD-10-CM | POA: Diagnosis not present

## 2024-04-29 NOTE — Progress Notes (Signed)
 Location: [facility]  Nursing Home Room Number: 158 A Place of Service:  SNF (31)  Provider: Ulyses Gandy NP   PCP: Aldo Hun, MD Patient Care Team: Aldo Hun, MD as PCP - General (Internal Medicine) Arvil Birks, MD as Consulting Physician (Orthopedic Surgery) [place], Eufemia Prindle, MD as Consulting Physician (Dermatology)  Extended Emergency Contact Information Primary Emergency Contact: Carter-Spencer,Lorien Home Phone: [phone] Mobile Phone: [phone] Relation: Daughter  Code Status: * Full Code  Goals of care:  Advanced Directive information    04/29/2024    2:41 PM  Advanced Directives  Does Patient Have a Medical Advance Directive? Yes  Type of Estate agent of [place];Living will  Does patient want to make changes to medical advance directive? No - Patient declined  Copy of Healthcare Power of Attorney in Chart? Yes - validated most recent copy scanned in chart (See row information)     Allergies  Allergen Reactions   Latex Rash   Sulfamethoxazole-Trimethoprim Nausea Only    Chief Complaint  Patient presents with   Other    Discharged    HPI:  83 y.o. female  seen today for discharge evaluation.   She currently resides on the rehab unit at [facility]. PMH: HTN, rhinitis, ischemic colitis, recurrent diverticulitis, OA, osteopenia, right breast cancer s/p lumpectomy and chemo/radiation/HRT 2011, and depression.   06/09 she underwent elective left knee arthroplasty by Dr. France Ina. Concerns for constipation and leg swelling first 2 days in rehab. She was given extra miralax  and she began to have BMs, even some brief diarrhea. Leg swelling has improved with increased ice applications. She is not taking oxycodone  anymore. Pain tolerated with tylenol , tramadol  and robaxin  prn. Ambulating short distances well. WBAT. Using FWW. No recent falls. Remains on Xarelto  until 07/01 for dvt prophylaxis. Denies chest pain, shortness  of breath or calf pain. Appetite fair. Afebrile. Vitals stable.   [place] plans to [place] back to [place] 06/21. Daughter will help at home. [place]. F/u with Dr. France Ina 06/25.   Recent hgb 11.4, WBC 5.3 04/29/2024.   Past Medical History:  Diagnosis Date   Allergic rhinitis    Allergy     Anemia    Anxiety    Arthritis    Asthma    Blood transfusion without reported diagnosis    Breast cancer (HCC) 2011   right side   Cataract    bil removed   Concussion    COVID    Depression    Diverticulitis    Diverticulosis    Endometriosis    GERD (gastroesophageal reflux disease)    Hypertension    IT band syndrome    Neuromuscular disorder (HCC)    ociptal neuralgia   Osteopenia    Pneumonia    Tubular adenoma of colon    UTI (urinary tract infection)     Past Surgical History:  Procedure Laterality Date   APPENDECTOMY  1988   BACK SURGERY  09/1999   BREAST LUMPECTOMY Right 09/04/2010   w/ Sentinel Node Biopsy   BREAST LUMPECTOMY  2/95621   Fibroadenoma. Left   carpel tunnel Right last five years (04/19/15)   CATARACT EXTRACTION W/ INTRAOCULAR LENS  IMPLANT, BILATERAL Bilateral 05/15/16 and 05/09/16   COLONOSCOPY     LIPOMA EXCISION  08/2002   L shoulder    NASAL SINUS SURGERY  1994   OCCIPITAL NEURECTOMY     Nerve block   POLYPECTOMY     pvd Right  rectal fissure repair  11/1955   TOTAL ABDOMINAL HYSTERECTOMY  1988   TOTAL KNEE ARTHROPLASTY Left 04/19/2024   Procedure: ARTHROPLASTY, KNEE, TOTAL;  Surgeon: Liliane Rei, MD;  Location: [facility];  Service: Orthopedics;  Laterality: Left;   TRIGGER FINGER RELEASE     x3   UPPER GASTROINTESTINAL ENDOSCOPY        reports that she quit smoking about 45 years ago. Her smoking use included cigarettes. She started smoking about 55 years ago. She has a 10 pack-year smoking history. She has never used smokeless tobacco. She reports current alcohol  use. She reports that she does not use drugs. Social History    Socioeconomic History   Marital status: Widowed    Spouse name: Not on file   Number of children: 2   Years of education: Not on file   Highest education level: Not on file  Occupational History   Occupation: fundraising consultant-retired  Tobacco Use   Smoking status: Former    Current packs/day: 0.00    Average packs/day: 1 pack/day for 10.0 years (10.0 ttl pk-yrs)    Types: Cigarettes    Start date: 11/11/1968    Quit date: 11/11/1978    Years since quitting: 45.4   Smokeless tobacco: Never  Vaping Use   Vaping status: Never Used  Substance and Sexual Activity   Alcohol  use: Yes    Comment: social   Drug use: No   Sexual activity: Not Currently    Partners: Male    Birth control/protection: Surgical    Comment: hysterectomy, menarche 83yo, sexual debut 83yo  Other Topics Concern   Not on file  Social History Narrative   Not on file   Social Drivers of Health   Financial Resource Strain: Not on file  Food Insecurity: No Food Insecurity (04/19/2024)   Hunger Vital Sign    Worried About Running Out of Food in the Last Year: Never true    Ran Out of Food in the Last Year: Never true  Transportation Needs: No Transportation Needs (04/19/2024)   PRAPARE - Administrator, Civil Service (Medical): No    Lack of Transportation (Non-Medical): No  Physical Activity: Not on file  Stress: Not on file  Social Connections: Moderately Integrated (04/19/2024)   Social Connection and Isolation Panel    Frequency of Communication with Friends and Family: More than three times a week    Frequency of Social Gatherings with Friends and Family: More than three times a week    Attends Religious Services: More than 4 times per year    Active Member of [employer] or Organizations: Yes    Attends Banker Meetings: More than 4 times per year    Marital Status: Widowed  Intimate Partner Violence: Not At Risk (04/19/2024)   Humiliation, Afraid, Rape, and Kick questionnaire     Fear of Current or Ex-Partner: No    Emotionally Abused: No    Physically Abused: No    Sexually Abused: No   Functional Status Survey:    Allergies  Allergen Reactions   Latex Rash   Sulfamethoxazole-Trimethoprim Nausea Only    Pertinent  Health Maintenance Due  Topic Date Due   INFLUENZA VACCINE  06/11/2024   DEXA SCAN  Completed   Colonoscopy  Discontinued    Medications: Allergies as of 04/29/2024       Reactions   Latex Rash   Sulfamethoxazole-trimethoprim Nausea Only        Medication List  Accurate as of April 29, 2024  2:46 PM. If you have any questions, ask your nurse or doctor.          buPROPion  150 MG 24 hr tablet Commonly known as: WELLBUTRIN  XL Take 300 mg by mouth daily.   cephALEXin 250 MG capsule Commonly known as: KEFLEX Take 250 mg by mouth at bedtime. X 15 days   chlorhexidine  4 % external liquid Commonly known as: HIBICLENS  Apply 15 mLs (1 Application total) topically as directed for 30 doses. Use as directed daily for 5 days every other week for 6 weeks.   CRANBERRY PO Take 30,000 mg by mouth daily.   [employer] SALINE NOSE SPRAY NA Place 1 spray into the nose daily.   cyanocobalamin  1000 MCG/ML injection Commonly known as: VITAMIN B12 Inject 1,000 mcg into the muscle every 3 (three) months. Patient had shot on 5/8.   denosumab  60 MG/ML Sosy injection Commonly known as: PROLIA  Inject 60 mg into the skin every 6 (six) months.   docusate sodium  100 MG capsule Commonly known as: COLACE Take 1 capsule (100 mg total) by mouth 2 (two) times daily.   fexofenadine 180 MG tablet Commonly known as: ALLEGRA Take 180 mg by mouth daily.   fluticasone 50 MCG/ACT nasal spray Commonly known as: FLONASE Place 2 sprays into both nostrils daily.   methocarbamol  500 MG tablet Commonly known as: ROBAXIN  Take 1 tablet (500 mg total) by mouth every 6 (six) hours as needed for muscle spasms.   mupirocin  ointment 2 % Commonly known as:  BACTROBAN  Place 1 Application into the nose 2 (two) times daily for 60 doses. Use as directed 2 times daily for 5 days every other week for 6 weeks.   nebivolol  5 MG tablet Commonly known as: BYSTOLIC  Take 5 mg by mouth daily.   omeprazole  20 MG capsule Commonly known as: PRILOSEC TAKE 1 CAPSULE BY MOUTH EVERY DAY   ondansetron  4 MG tablet Commonly known as: ZOFRAN  Take 1 tablet (4 mg total) by mouth every 6 (six) hours as needed for nausea.   ondansetron  8 MG disintegrating tablet Commonly known as: ZOFRAN -ODT Take 8 mg by mouth every 8 (eight) hours as needed for nausea or vomiting.   oxyCODONE  5 MG immediate release tablet Commonly known as: Oxy IR/ROXICODONE  Take 1-2 tablets (5-10 mg total) by mouth every 6 (six) hours as needed for severe pain (pain score 7-10).   polyethylene glycol powder 17 GM/SCOOP powder Commonly known as: GLYCOLAX /MIRALAX  Take 8.5 g by mouth every 3 (three) days.   Probiotic 10 Ultra Strength Caps Take 1 capsule by mouth daily.   rivaroxaban  10 MG Tabs tablet Commonly known as: XARELTO  Take 1 tablet (10 mg total) by mouth daily with breakfast for 21 days. Then take one 81 mg aspirin once a day for three weeks. Then discontinue aspirin.   traMADol  50 MG tablet Commonly known as: ULTRAM  Take 1-2 tablets (50-100 mg total) by mouth every 6 (six) hours as needed for moderate pain (pain score 4-6).        Review of Systems  Constitutional: Negative.   HENT: Negative.    Respiratory: Negative.    Cardiovascular: Negative.   Gastrointestinal:  Positive for constipation and diarrhea.  Genitourinary: Negative.   Musculoskeletal:  Positive for arthralgias and gait problem.  Skin:  Positive for wound.  Neurological:  Positive for weakness.  Psychiatric/Behavioral: Negative.      Vitals:   04/29/24 1439  BP: 119/76  Pulse: 90  Resp: 17  Temp: 98.2 F (36.8 C)  SpO2: 96%  Weight: 160 lb (72.6 kg)  Height: 4' 11 (1.499 m)   Body mass  index is 32.32 kg/m. Physical Exam Vitals reviewed.  Constitutional:      General: She is not in acute distress. HENT:     Head: Normocephalic.   Eyes:     General:        Right eye: No discharge.        Left eye: No discharge.    Cardiovascular:     Rate and Rhythm: Normal rate and regular rhythm.     Pulses: Normal pulses.     Heart sounds: Normal heart sounds.  Pulmonary:     Effort: Pulmonary effort is normal.     Breath sounds: Normal breath sounds.  Abdominal:     General: There is no distension.     Palpations: Abdomen is soft.     Tenderness: There is no abdominal tenderness.   Musculoskeletal:     Cervical back: Neck supple.     Right lower leg: No edema.     Left lower leg: No edema.     Comments: Left knee with mild bruising and swelling, surgical dressing CDI, dorsiflexion 5/5   Skin:    General: Skin is warm.     Capillary Refill: Capillary refill takes less than 2 seconds.   Neurological:     General: No focal deficit present.     Mental Status: She is alert and oriented to person, place, and time.     Gait: Gait abnormal.   Psychiatric:        Mood and Affect: Mood normal.     Labs reviewed: Basic Metabolic Panel: Recent Labs    08/15/23 1142 04/12/24 1133 04/20/24 0353  NA 131* 127* 129*  K 4.5 4.3 3.8  CL 96 95* 100  CO2 27 21* 21*  GLUCOSE 94 81 164*  BUN 11 11 13   CREATININE 0.79 0.91 0.63  CALCIUM 9.3 8.6* 8.1*   Liver Function Tests: Recent Labs    08/15/23 1142  AST 20  ALT 16  ALKPHOS 85  BILITOT 0.5  PROT 7.3  ALBUMIN 4.0   No results for input(s): LIPASE, AMYLASE in the last 8760 hours. No results for input(s): AMMONIA in the last 8760 hours. CBC: Recent Labs    08/15/23 1142 04/12/24 1133 04/20/24 0353  WBC 7.5 5.9 11.4*  NEUTROABS 5.4  --   --   HGB 13.6 13.0 10.3*  HCT 41.6 39.3 30.6*  MCV 94.6 95.6 94.7  PLT 444.0* 365 290   Cardiac Enzymes: No results for input(s): CKTOTAL, CKMB,  CKMBINDEX, TROPONINI in the last 8760 hours. BNP: Invalid input(s): POCBNP CBG: No results for input(s): GLUCAP in the last 8760 hours.  Procedures and Imaging Studies During Stay: No results found.  Assessment/Plan:   1. S/P total knee arthroplasty, left (Primary) - 06/09 elective surgery with Dr. France Ina f/u 6/25 - WBAT - doing well with PT/OT - Xarelto  completed 07/01 - pain controlled with tylenol , Tramadol  and robaxin > not using oxycodone  - multiple BM during stay - cont PT/OT in [place]  2. Hyponatremia - Na+ improved to 130 - asymptomatic  3. Primary hypertension - controlled without medications  4. Recurrent urinary tract infection - cont Keflex x 15 days  5. Mild episode of recurrent major depressive disorder (HCC) - stable mood - cont Wellbutrin   6. Age-related osteoporosis without current pathological fracture - cont Prolia     Patient is  being discharged with the following home health services:  PT/OT  Patient is being discharged with the following durable medical equipment:  FWW  Patient has been advised to f/u with their PCP in 1-2 weeks to bring them up to date on their rehab stay.  Social services at facility was responsible for arranging this appointment.  Pt was provided with a 30 day supply of prescriptions for medications and refills must be obtained from their PCP.  For controlled substances, a more limited supply may be provided adequate until PCP appointment only.  Future labs/tests needed:  none

## 2024-04-30 ENCOUNTER — Encounter: Payer: Self-pay | Admitting: Orthopedic Surgery

## 2024-04-30 DIAGNOSIS — Z9181 History of falling: Secondary | ICD-10-CM | POA: Diagnosis not present

## 2024-04-30 DIAGNOSIS — M25562 Pain in left knee: Secondary | ICD-10-CM | POA: Diagnosis not present

## 2024-04-30 DIAGNOSIS — R2681 Unsteadiness on feet: Secondary | ICD-10-CM | POA: Diagnosis not present

## 2024-04-30 DIAGNOSIS — R262 Difficulty in walking, not elsewhere classified: Secondary | ICD-10-CM | POA: Diagnosis not present

## 2024-04-30 DIAGNOSIS — M02162 Postdysenteric arthropathy, left knee: Secondary | ICD-10-CM | POA: Diagnosis not present

## 2024-04-30 DIAGNOSIS — M25662 Stiffness of left knee, not elsewhere classified: Secondary | ICD-10-CM | POA: Diagnosis not present

## 2024-04-30 DIAGNOSIS — M6281 Muscle weakness (generalized): Secondary | ICD-10-CM | POA: Diagnosis not present

## 2024-05-03 DIAGNOSIS — Z96659 Presence of unspecified artificial knee joint: Secondary | ICD-10-CM | POA: Diagnosis not present

## 2024-05-03 DIAGNOSIS — R278 Other lack of coordination: Secondary | ICD-10-CM | POA: Diagnosis not present

## 2024-05-04 DIAGNOSIS — Z96652 Presence of left artificial knee joint: Secondary | ICD-10-CM | POA: Diagnosis not present

## 2024-05-04 DIAGNOSIS — R262 Difficulty in walking, not elsewhere classified: Secondary | ICD-10-CM | POA: Diagnosis not present

## 2024-05-04 DIAGNOSIS — Z9181 History of falling: Secondary | ICD-10-CM | POA: Diagnosis not present

## 2024-05-04 DIAGNOSIS — M25662 Stiffness of left knee, not elsewhere classified: Secondary | ICD-10-CM | POA: Diagnosis not present

## 2024-05-04 DIAGNOSIS — M62562 Muscle wasting and atrophy, not elsewhere classified, left lower leg: Secondary | ICD-10-CM | POA: Diagnosis not present

## 2024-05-04 DIAGNOSIS — M25562 Pain in left knee: Secondary | ICD-10-CM | POA: Diagnosis not present

## 2024-05-06 DIAGNOSIS — M25662 Stiffness of left knee, not elsewhere classified: Secondary | ICD-10-CM | POA: Diagnosis not present

## 2024-05-06 DIAGNOSIS — Z9181 History of falling: Secondary | ICD-10-CM | POA: Diagnosis not present

## 2024-05-06 DIAGNOSIS — M62562 Muscle wasting and atrophy, not elsewhere classified, left lower leg: Secondary | ICD-10-CM | POA: Diagnosis not present

## 2024-05-06 DIAGNOSIS — Z96652 Presence of left artificial knee joint: Secondary | ICD-10-CM | POA: Diagnosis not present

## 2024-05-06 DIAGNOSIS — R262 Difficulty in walking, not elsewhere classified: Secondary | ICD-10-CM | POA: Diagnosis not present

## 2024-05-06 DIAGNOSIS — M25562 Pain in left knee: Secondary | ICD-10-CM | POA: Diagnosis not present

## 2024-05-08 DIAGNOSIS — M25562 Pain in left knee: Secondary | ICD-10-CM | POA: Diagnosis not present

## 2024-05-08 DIAGNOSIS — M62562 Muscle wasting and atrophy, not elsewhere classified, left lower leg: Secondary | ICD-10-CM | POA: Diagnosis not present

## 2024-05-08 DIAGNOSIS — Z96652 Presence of left artificial knee joint: Secondary | ICD-10-CM | POA: Diagnosis not present

## 2024-05-08 DIAGNOSIS — Z9181 History of falling: Secondary | ICD-10-CM | POA: Diagnosis not present

## 2024-05-08 DIAGNOSIS — R262 Difficulty in walking, not elsewhere classified: Secondary | ICD-10-CM | POA: Diagnosis not present

## 2024-05-08 DIAGNOSIS — M25662 Stiffness of left knee, not elsewhere classified: Secondary | ICD-10-CM | POA: Diagnosis not present

## 2024-05-10 DIAGNOSIS — M62838 Other muscle spasm: Secondary | ICD-10-CM | POA: Diagnosis not present

## 2024-05-10 DIAGNOSIS — R151 Fecal smearing: Secondary | ICD-10-CM | POA: Diagnosis not present

## 2024-05-10 DIAGNOSIS — Z9181 History of falling: Secondary | ICD-10-CM | POA: Diagnosis not present

## 2024-05-10 DIAGNOSIS — N3941 Urge incontinence: Secondary | ICD-10-CM | POA: Diagnosis not present

## 2024-05-10 DIAGNOSIS — M6289 Other specified disorders of muscle: Secondary | ICD-10-CM | POA: Diagnosis not present

## 2024-05-10 DIAGNOSIS — R102 Pelvic and perineal pain: Secondary | ICD-10-CM | POA: Diagnosis not present

## 2024-05-10 DIAGNOSIS — R262 Difficulty in walking, not elsewhere classified: Secondary | ICD-10-CM | POA: Diagnosis not present

## 2024-05-10 DIAGNOSIS — M25562 Pain in left knee: Secondary | ICD-10-CM | POA: Diagnosis not present

## 2024-05-10 DIAGNOSIS — M25662 Stiffness of left knee, not elsewhere classified: Secondary | ICD-10-CM | POA: Diagnosis not present

## 2024-05-10 DIAGNOSIS — M62562 Muscle wasting and atrophy, not elsewhere classified, left lower leg: Secondary | ICD-10-CM | POA: Diagnosis not present

## 2024-05-10 DIAGNOSIS — M6281 Muscle weakness (generalized): Secondary | ICD-10-CM | POA: Diagnosis not present

## 2024-05-10 DIAGNOSIS — Z96652 Presence of left artificial knee joint: Secondary | ICD-10-CM | POA: Diagnosis not present

## 2024-05-12 DIAGNOSIS — R278 Other lack of coordination: Secondary | ICD-10-CM | POA: Diagnosis not present

## 2024-05-12 DIAGNOSIS — Z96659 Presence of unspecified artificial knee joint: Secondary | ICD-10-CM | POA: Diagnosis not present

## 2024-05-13 DIAGNOSIS — M62562 Muscle wasting and atrophy, not elsewhere classified, left lower leg: Secondary | ICD-10-CM | POA: Diagnosis not present

## 2024-05-13 DIAGNOSIS — R262 Difficulty in walking, not elsewhere classified: Secondary | ICD-10-CM | POA: Diagnosis not present

## 2024-05-13 DIAGNOSIS — Z9181 History of falling: Secondary | ICD-10-CM | POA: Diagnosis not present

## 2024-05-13 DIAGNOSIS — Z96652 Presence of left artificial knee joint: Secondary | ICD-10-CM | POA: Diagnosis not present

## 2024-05-13 DIAGNOSIS — M25662 Stiffness of left knee, not elsewhere classified: Secondary | ICD-10-CM | POA: Diagnosis not present

## 2024-05-13 DIAGNOSIS — M25562 Pain in left knee: Secondary | ICD-10-CM | POA: Diagnosis not present

## 2024-05-15 DIAGNOSIS — Z9181 History of falling: Secondary | ICD-10-CM | POA: Diagnosis not present

## 2024-05-15 DIAGNOSIS — M25662 Stiffness of left knee, not elsewhere classified: Secondary | ICD-10-CM | POA: Diagnosis not present

## 2024-05-15 DIAGNOSIS — R262 Difficulty in walking, not elsewhere classified: Secondary | ICD-10-CM | POA: Diagnosis not present

## 2024-05-15 DIAGNOSIS — M25562 Pain in left knee: Secondary | ICD-10-CM | POA: Diagnosis not present

## 2024-05-15 DIAGNOSIS — M62562 Muscle wasting and atrophy, not elsewhere classified, left lower leg: Secondary | ICD-10-CM | POA: Diagnosis not present

## 2024-05-15 DIAGNOSIS — Z96652 Presence of left artificial knee joint: Secondary | ICD-10-CM | POA: Diagnosis not present

## 2024-05-17 DIAGNOSIS — H43813 Vitreous degeneration, bilateral: Secondary | ICD-10-CM | POA: Diagnosis not present

## 2024-05-17 DIAGNOSIS — Z961 Presence of intraocular lens: Secondary | ICD-10-CM | POA: Diagnosis not present

## 2024-05-18 DIAGNOSIS — Z96652 Presence of left artificial knee joint: Secondary | ICD-10-CM | POA: Diagnosis not present

## 2024-05-18 DIAGNOSIS — M25662 Stiffness of left knee, not elsewhere classified: Secondary | ICD-10-CM | POA: Diagnosis not present

## 2024-05-18 DIAGNOSIS — M62562 Muscle wasting and atrophy, not elsewhere classified, left lower leg: Secondary | ICD-10-CM | POA: Diagnosis not present

## 2024-05-18 DIAGNOSIS — Z9181 History of falling: Secondary | ICD-10-CM | POA: Diagnosis not present

## 2024-05-18 DIAGNOSIS — R262 Difficulty in walking, not elsewhere classified: Secondary | ICD-10-CM | POA: Diagnosis not present

## 2024-05-18 DIAGNOSIS — M25562 Pain in left knee: Secondary | ICD-10-CM | POA: Diagnosis not present

## 2024-05-20 DIAGNOSIS — M25562 Pain in left knee: Secondary | ICD-10-CM | POA: Diagnosis not present

## 2024-05-20 DIAGNOSIS — M25662 Stiffness of left knee, not elsewhere classified: Secondary | ICD-10-CM | POA: Diagnosis not present

## 2024-05-20 DIAGNOSIS — R262 Difficulty in walking, not elsewhere classified: Secondary | ICD-10-CM | POA: Diagnosis not present

## 2024-05-20 DIAGNOSIS — Z96652 Presence of left artificial knee joint: Secondary | ICD-10-CM | POA: Diagnosis not present

## 2024-05-20 DIAGNOSIS — M62562 Muscle wasting and atrophy, not elsewhere classified, left lower leg: Secondary | ICD-10-CM | POA: Diagnosis not present

## 2024-05-20 DIAGNOSIS — Z9181 History of falling: Secondary | ICD-10-CM | POA: Diagnosis not present

## 2024-05-22 DIAGNOSIS — M25662 Stiffness of left knee, not elsewhere classified: Secondary | ICD-10-CM | POA: Diagnosis not present

## 2024-05-22 DIAGNOSIS — M62562 Muscle wasting and atrophy, not elsewhere classified, left lower leg: Secondary | ICD-10-CM | POA: Diagnosis not present

## 2024-05-22 DIAGNOSIS — Z9181 History of falling: Secondary | ICD-10-CM | POA: Diagnosis not present

## 2024-05-22 DIAGNOSIS — M25562 Pain in left knee: Secondary | ICD-10-CM | POA: Diagnosis not present

## 2024-05-22 DIAGNOSIS — R262 Difficulty in walking, not elsewhere classified: Secondary | ICD-10-CM | POA: Diagnosis not present

## 2024-05-22 DIAGNOSIS — Z96652 Presence of left artificial knee joint: Secondary | ICD-10-CM | POA: Diagnosis not present

## 2024-05-26 DIAGNOSIS — M25662 Stiffness of left knee, not elsewhere classified: Secondary | ICD-10-CM | POA: Diagnosis not present

## 2024-05-26 DIAGNOSIS — Z9181 History of falling: Secondary | ICD-10-CM | POA: Diagnosis not present

## 2024-05-26 DIAGNOSIS — Z96652 Presence of left artificial knee joint: Secondary | ICD-10-CM | POA: Diagnosis not present

## 2024-05-26 DIAGNOSIS — M62562 Muscle wasting and atrophy, not elsewhere classified, left lower leg: Secondary | ICD-10-CM | POA: Diagnosis not present

## 2024-05-26 DIAGNOSIS — M25562 Pain in left knee: Secondary | ICD-10-CM | POA: Diagnosis not present

## 2024-05-26 DIAGNOSIS — R262 Difficulty in walking, not elsewhere classified: Secondary | ICD-10-CM | POA: Diagnosis not present

## 2024-05-27 DIAGNOSIS — Z5189 Encounter for other specified aftercare: Secondary | ICD-10-CM | POA: Diagnosis not present

## 2024-05-28 DIAGNOSIS — E871 Hypo-osmolality and hyponatremia: Secondary | ICD-10-CM | POA: Diagnosis not present

## 2024-05-28 DIAGNOSIS — I951 Orthostatic hypotension: Secondary | ICD-10-CM | POA: Diagnosis not present

## 2024-05-28 DIAGNOSIS — E559 Vitamin D deficiency, unspecified: Secondary | ICD-10-CM | POA: Diagnosis not present

## 2024-05-28 DIAGNOSIS — F329 Major depressive disorder, single episode, unspecified: Secondary | ICD-10-CM | POA: Diagnosis not present

## 2024-05-28 DIAGNOSIS — I10 Essential (primary) hypertension: Secondary | ICD-10-CM | POA: Diagnosis not present

## 2024-05-28 DIAGNOSIS — I9581 Postprocedural hypotension: Secondary | ICD-10-CM | POA: Diagnosis not present

## 2024-05-28 DIAGNOSIS — R21 Rash and other nonspecific skin eruption: Secondary | ICD-10-CM | POA: Diagnosis not present

## 2024-05-28 DIAGNOSIS — M179 Osteoarthritis of knee, unspecified: Secondary | ICD-10-CM | POA: Diagnosis not present

## 2024-05-28 DIAGNOSIS — D72819 Decreased white blood cell count, unspecified: Secondary | ICD-10-CM | POA: Diagnosis not present

## 2024-05-28 DIAGNOSIS — R5383 Other fatigue: Secondary | ICD-10-CM | POA: Diagnosis not present

## 2024-05-29 DIAGNOSIS — Z96652 Presence of left artificial knee joint: Secondary | ICD-10-CM | POA: Diagnosis not present

## 2024-05-29 DIAGNOSIS — M25662 Stiffness of left knee, not elsewhere classified: Secondary | ICD-10-CM | POA: Diagnosis not present

## 2024-05-29 DIAGNOSIS — Z9181 History of falling: Secondary | ICD-10-CM | POA: Diagnosis not present

## 2024-05-29 DIAGNOSIS — M25562 Pain in left knee: Secondary | ICD-10-CM | POA: Diagnosis not present

## 2024-05-29 DIAGNOSIS — R262 Difficulty in walking, not elsewhere classified: Secondary | ICD-10-CM | POA: Diagnosis not present

## 2024-05-29 DIAGNOSIS — M62562 Muscle wasting and atrophy, not elsewhere classified, left lower leg: Secondary | ICD-10-CM | POA: Diagnosis not present

## 2024-05-31 DIAGNOSIS — M25562 Pain in left knee: Secondary | ICD-10-CM | POA: Diagnosis not present

## 2024-05-31 DIAGNOSIS — R262 Difficulty in walking, not elsewhere classified: Secondary | ICD-10-CM | POA: Diagnosis not present

## 2024-05-31 DIAGNOSIS — Z9181 History of falling: Secondary | ICD-10-CM | POA: Diagnosis not present

## 2024-05-31 DIAGNOSIS — Z96652 Presence of left artificial knee joint: Secondary | ICD-10-CM | POA: Diagnosis not present

## 2024-05-31 DIAGNOSIS — M62562 Muscle wasting and atrophy, not elsewhere classified, left lower leg: Secondary | ICD-10-CM | POA: Diagnosis not present

## 2024-05-31 DIAGNOSIS — M25662 Stiffness of left knee, not elsewhere classified: Secondary | ICD-10-CM | POA: Diagnosis not present

## 2024-06-02 DIAGNOSIS — R262 Difficulty in walking, not elsewhere classified: Secondary | ICD-10-CM | POA: Diagnosis not present

## 2024-06-02 DIAGNOSIS — Z96652 Presence of left artificial knee joint: Secondary | ICD-10-CM | POA: Diagnosis not present

## 2024-06-02 DIAGNOSIS — Z9181 History of falling: Secondary | ICD-10-CM | POA: Diagnosis not present

## 2024-06-02 DIAGNOSIS — M25662 Stiffness of left knee, not elsewhere classified: Secondary | ICD-10-CM | POA: Diagnosis not present

## 2024-06-02 DIAGNOSIS — M62562 Muscle wasting and atrophy, not elsewhere classified, left lower leg: Secondary | ICD-10-CM | POA: Diagnosis not present

## 2024-06-02 DIAGNOSIS — M25562 Pain in left knee: Secondary | ICD-10-CM | POA: Diagnosis not present

## 2024-06-04 DIAGNOSIS — M62562 Muscle wasting and atrophy, not elsewhere classified, left lower leg: Secondary | ICD-10-CM | POA: Diagnosis not present

## 2024-06-04 DIAGNOSIS — M25562 Pain in left knee: Secondary | ICD-10-CM | POA: Diagnosis not present

## 2024-06-04 DIAGNOSIS — Z9181 History of falling: Secondary | ICD-10-CM | POA: Diagnosis not present

## 2024-06-04 DIAGNOSIS — R262 Difficulty in walking, not elsewhere classified: Secondary | ICD-10-CM | POA: Diagnosis not present

## 2024-06-04 DIAGNOSIS — Z96652 Presence of left artificial knee joint: Secondary | ICD-10-CM | POA: Diagnosis not present

## 2024-06-04 DIAGNOSIS — M25662 Stiffness of left knee, not elsewhere classified: Secondary | ICD-10-CM | POA: Diagnosis not present

## 2024-06-07 DIAGNOSIS — R262 Difficulty in walking, not elsewhere classified: Secondary | ICD-10-CM | POA: Diagnosis not present

## 2024-06-07 DIAGNOSIS — Z96652 Presence of left artificial knee joint: Secondary | ICD-10-CM | POA: Diagnosis not present

## 2024-06-07 DIAGNOSIS — Z9181 History of falling: Secondary | ICD-10-CM | POA: Diagnosis not present

## 2024-06-07 DIAGNOSIS — M25662 Stiffness of left knee, not elsewhere classified: Secondary | ICD-10-CM | POA: Diagnosis not present

## 2024-06-07 DIAGNOSIS — M25562 Pain in left knee: Secondary | ICD-10-CM | POA: Diagnosis not present

## 2024-06-07 DIAGNOSIS — M62562 Muscle wasting and atrophy, not elsewhere classified, left lower leg: Secondary | ICD-10-CM | POA: Diagnosis not present

## 2024-06-09 DIAGNOSIS — M25562 Pain in left knee: Secondary | ICD-10-CM | POA: Diagnosis not present

## 2024-06-09 DIAGNOSIS — Z9181 History of falling: Secondary | ICD-10-CM | POA: Diagnosis not present

## 2024-06-09 DIAGNOSIS — Z96652 Presence of left artificial knee joint: Secondary | ICD-10-CM | POA: Diagnosis not present

## 2024-06-09 DIAGNOSIS — R262 Difficulty in walking, not elsewhere classified: Secondary | ICD-10-CM | POA: Diagnosis not present

## 2024-06-09 DIAGNOSIS — M62562 Muscle wasting and atrophy, not elsewhere classified, left lower leg: Secondary | ICD-10-CM | POA: Diagnosis not present

## 2024-06-09 DIAGNOSIS — M25662 Stiffness of left knee, not elsewhere classified: Secondary | ICD-10-CM | POA: Diagnosis not present

## 2024-06-15 ENCOUNTER — Encounter: Payer: PPO | Admitting: Radiology

## 2024-06-15 DIAGNOSIS — M25562 Pain in left knee: Secondary | ICD-10-CM | POA: Diagnosis not present

## 2024-06-15 DIAGNOSIS — R262 Difficulty in walking, not elsewhere classified: Secondary | ICD-10-CM | POA: Diagnosis not present

## 2024-06-15 DIAGNOSIS — M62562 Muscle wasting and atrophy, not elsewhere classified, left lower leg: Secondary | ICD-10-CM | POA: Diagnosis not present

## 2024-06-15 DIAGNOSIS — Z9181 History of falling: Secondary | ICD-10-CM | POA: Diagnosis not present

## 2024-06-15 DIAGNOSIS — Z96652 Presence of left artificial knee joint: Secondary | ICD-10-CM | POA: Diagnosis not present

## 2024-06-15 DIAGNOSIS — M25662 Stiffness of left knee, not elsewhere classified: Secondary | ICD-10-CM | POA: Diagnosis not present

## 2024-06-17 DIAGNOSIS — M25662 Stiffness of left knee, not elsewhere classified: Secondary | ICD-10-CM | POA: Diagnosis not present

## 2024-06-17 DIAGNOSIS — M62562 Muscle wasting and atrophy, not elsewhere classified, left lower leg: Secondary | ICD-10-CM | POA: Diagnosis not present

## 2024-06-17 DIAGNOSIS — Z96652 Presence of left artificial knee joint: Secondary | ICD-10-CM | POA: Diagnosis not present

## 2024-06-17 DIAGNOSIS — Z9181 History of falling: Secondary | ICD-10-CM | POA: Diagnosis not present

## 2024-06-17 DIAGNOSIS — R262 Difficulty in walking, not elsewhere classified: Secondary | ICD-10-CM | POA: Diagnosis not present

## 2024-06-17 DIAGNOSIS — M25562 Pain in left knee: Secondary | ICD-10-CM | POA: Diagnosis not present

## 2024-06-23 DIAGNOSIS — R262 Difficulty in walking, not elsewhere classified: Secondary | ICD-10-CM | POA: Diagnosis not present

## 2024-06-23 DIAGNOSIS — M62562 Muscle wasting and atrophy, not elsewhere classified, left lower leg: Secondary | ICD-10-CM | POA: Diagnosis not present

## 2024-06-23 DIAGNOSIS — Z96652 Presence of left artificial knee joint: Secondary | ICD-10-CM | POA: Diagnosis not present

## 2024-06-23 DIAGNOSIS — Z9181 History of falling: Secondary | ICD-10-CM | POA: Diagnosis not present

## 2024-06-23 DIAGNOSIS — M25562 Pain in left knee: Secondary | ICD-10-CM | POA: Diagnosis not present

## 2024-06-23 DIAGNOSIS — M25662 Stiffness of left knee, not elsewhere classified: Secondary | ICD-10-CM | POA: Diagnosis not present

## 2024-06-25 DIAGNOSIS — M62562 Muscle wasting and atrophy, not elsewhere classified, left lower leg: Secondary | ICD-10-CM | POA: Diagnosis not present

## 2024-06-25 DIAGNOSIS — M25662 Stiffness of left knee, not elsewhere classified: Secondary | ICD-10-CM | POA: Diagnosis not present

## 2024-06-25 DIAGNOSIS — M25562 Pain in left knee: Secondary | ICD-10-CM | POA: Diagnosis not present

## 2024-06-25 DIAGNOSIS — R262 Difficulty in walking, not elsewhere classified: Secondary | ICD-10-CM | POA: Diagnosis not present

## 2024-06-25 DIAGNOSIS — Z9181 History of falling: Secondary | ICD-10-CM | POA: Diagnosis not present

## 2024-06-25 DIAGNOSIS — Z96652 Presence of left artificial knee joint: Secondary | ICD-10-CM | POA: Diagnosis not present

## 2024-06-28 DIAGNOSIS — Z853 Personal history of malignant neoplasm of breast: Secondary | ICD-10-CM | POA: Diagnosis not present

## 2024-06-28 DIAGNOSIS — Z9889 Other specified postprocedural states: Secondary | ICD-10-CM | POA: Diagnosis not present

## 2024-06-28 DIAGNOSIS — R92323 Mammographic fibroglandular density, bilateral breasts: Secondary | ICD-10-CM | POA: Diagnosis not present

## 2024-06-28 DIAGNOSIS — Z1231 Encounter for screening mammogram for malignant neoplasm of breast: Secondary | ICD-10-CM | POA: Diagnosis not present

## 2024-06-28 DIAGNOSIS — Z923 Personal history of irradiation: Secondary | ICD-10-CM | POA: Diagnosis not present

## 2024-07-02 DIAGNOSIS — Z96652 Presence of left artificial knee joint: Secondary | ICD-10-CM | POA: Diagnosis not present

## 2024-07-02 DIAGNOSIS — R262 Difficulty in walking, not elsewhere classified: Secondary | ICD-10-CM | POA: Diagnosis not present

## 2024-07-02 DIAGNOSIS — M25662 Stiffness of left knee, not elsewhere classified: Secondary | ICD-10-CM | POA: Diagnosis not present

## 2024-07-02 DIAGNOSIS — M62562 Muscle wasting and atrophy, not elsewhere classified, left lower leg: Secondary | ICD-10-CM | POA: Diagnosis not present

## 2024-07-02 DIAGNOSIS — Z9181 History of falling: Secondary | ICD-10-CM | POA: Diagnosis not present

## 2024-07-02 DIAGNOSIS — M25562 Pain in left knee: Secondary | ICD-10-CM | POA: Diagnosis not present

## 2024-07-08 DIAGNOSIS — N3001 Acute cystitis with hematuria: Secondary | ICD-10-CM | POA: Diagnosis not present

## 2024-07-08 DIAGNOSIS — E871 Hypo-osmolality and hyponatremia: Secondary | ICD-10-CM | POA: Diagnosis not present

## 2024-07-08 DIAGNOSIS — D72825 Bandemia: Secondary | ICD-10-CM | POA: Diagnosis not present

## 2024-07-08 DIAGNOSIS — N179 Acute kidney failure, unspecified: Secondary | ICD-10-CM | POA: Diagnosis not present

## 2024-07-08 DIAGNOSIS — D72819 Decreased white blood cell count, unspecified: Secondary | ICD-10-CM | POA: Diagnosis not present

## 2024-07-08 DIAGNOSIS — I9581 Postprocedural hypotension: Secondary | ICD-10-CM | POA: Diagnosis not present

## 2024-07-08 DIAGNOSIS — R197 Diarrhea, unspecified: Secondary | ICD-10-CM | POA: Diagnosis not present

## 2024-07-08 DIAGNOSIS — N390 Urinary tract infection, site not specified: Secondary | ICD-10-CM | POA: Diagnosis not present

## 2024-07-08 DIAGNOSIS — R5383 Other fatigue: Secondary | ICD-10-CM | POA: Diagnosis not present

## 2024-07-08 DIAGNOSIS — R824 Acetonuria: Secondary | ICD-10-CM | POA: Diagnosis not present

## 2024-07-08 DIAGNOSIS — I951 Orthostatic hypotension: Secondary | ICD-10-CM | POA: Diagnosis not present

## 2024-07-08 DIAGNOSIS — I10 Essential (primary) hypertension: Secondary | ICD-10-CM | POA: Diagnosis not present

## 2024-07-08 DIAGNOSIS — M179 Osteoarthritis of knee, unspecified: Secondary | ICD-10-CM | POA: Diagnosis not present

## 2024-07-09 ENCOUNTER — Non-Acute Institutional Stay (SKILLED_NURSING_FACILITY): Payer: Self-pay | Admitting: Adult Health

## 2024-07-09 ENCOUNTER — Encounter: Payer: Self-pay | Admitting: Adult Health

## 2024-07-09 DIAGNOSIS — E871 Hypo-osmolality and hyponatremia: Secondary | ICD-10-CM | POA: Diagnosis not present

## 2024-07-09 DIAGNOSIS — R197 Diarrhea, unspecified: Secondary | ICD-10-CM

## 2024-07-09 DIAGNOSIS — N3000 Acute cystitis without hematuria: Secondary | ICD-10-CM

## 2024-07-09 DIAGNOSIS — D649 Anemia, unspecified: Secondary | ICD-10-CM | POA: Diagnosis not present

## 2024-07-09 MED ORDER — ACETAMINOPHEN 325 MG PO TABS: 650.0000 mg | ORAL_TABLET | ORAL | Status: AC | PRN

## 2024-07-10 ENCOUNTER — Telehealth: Payer: Self-pay | Admitting: Adult Health

## 2024-07-10 NOTE — Telephone Encounter (Signed)
 CMP returned ALT 206, AST 138, Alk phos 312 TBIL 0.2 BUN 7.2 Cr 0.83 Na 129  Pt is stable with no abd pain, fever or diarrhea. Was feeling sluggish this am Recommend Abd US  ordered stat. Nurse to reach out and see if the mobile company will be available this weekend and get back to me.  Repeat CMP in the am with hepatitis panel A and B, GGT, amylase and lipase Covid swab Will give one more liter of fluid Her daughter Beena is a PA and will reach out to Ms. Betterton's GI physician Dr Albertus Differential causes are wide and include liver injury due to zepbound, dehydration, or gallbladder issues.

## 2024-07-10 NOTE — Progress Notes (Addendum)
 Location:  Medical illustrator of Service:  SNF (31) Provider:   Bari America, ANP [facility] [phone]   Shayne Anes, MD  Patient Care Team: Shayne Anes, MD as PCP - General (Internal Medicine) Shari Easter, MD as Consulting Physician (Orthopedic Surgery) [place], Amy, MD as Consulting Physician (Dermatology)  Extended Emergency Contact Information Primary Emergency Contact: Carter-Spencer,Christabelle Home Phone: [phone] Mobile Phone: [phone] Relation: Daughter  Code Status:  Full Goals of care: Advanced Directive information    04/29/2024    2:41 PM  Advanced Directives  Does Patient Have a Medical Advance Directive? Yes  Type of Estate agent of [place];Living will  Does patient want to make changes to medical advance directive? No - Patient declined  Copy of Healthcare Power of Attorney in Chart? Yes - validated most recent copy scanned in chart (See row information)     Chief Complaint  Patient presents with   Acute Visit    hyponatremia    HPI:   The patient is an 83 year old with hyponatremia and UTI who presents with low blood pressure, tachycardia, and diarrhea.  Hypotension and tachycardia - Admitted to skilled rehab on July 08, 2024, from independent living due to low blood pressure and tachycardia per PCP Dr Shayne  - soft bp - Fatigue associated with hypotension -also lack of mental clarity  Diarrhea and gastrointestinal symptoms - Diarrhea onset after increased use of Miralax  for constipation relief -of note she is on zebound and her dose was recently increased to 5 mg -now on hold.  Chronic hyponatremia - Chronic hyponatremia present reduced to 122 after diarrhea  -repeat NA 130 07/09/24  Leukocytosis and urinary findings - Initial leukocytosis with white blood cell count of 15.5 and neutrophil percentage of 86.5 - Repeat CBC on August 29 showed white blood cell count returned   at 9.2 - Urinalysis revealed 2+ leukocytes - Started on Keflex - No urinary symptoms present  Anemia - Hemoglobin decreased by one gram to 11.9 - Repeat CBC on August 29 showed hemoglobin of 11.5  Arthralgia - On Mobic for arthritic pain to the knees   Na: 122 (07/08/2024) BUN: 17 (07/08/2024) Cr: 1.4 (07/08/2024) WBC: 15.5 (07/08/2024) Neutrophil %: 86.5 (07/08/2024) Hb: 11.9 (07/08/2024) Na: 130 (07/09/2024) BUN: 10.2 (07/09/2024) Cr: 0.9 (07/09/2024) Hb: 11.5 (07/09/2024) MCV: 91.7 (07/09/2024) PLT: 328 (07/09/2024) WBC: 9.2 (07/09/2024) Past Medical History:  Diagnosis Date   Allergic rhinitis    Allergy     Anemia    Anxiety    Arthritis    Asthma    Blood transfusion without reported diagnosis    Breast cancer (HCC) 2011   right side   Cataract    bil removed   Concussion    COVID    Depression    Diverticulitis    Diverticulosis    Endometriosis    GERD (gastroesophageal reflux disease)    Hypertension    IT band syndrome    Neuromuscular disorder (HCC)    ociptal neuralgia   Osteopenia    Pneumonia    Tubular adenoma of colon    UTI (urinary tract infection)    Past Surgical History:  Procedure Laterality Date   APPENDECTOMY  1988   BACK SURGERY  09/1999   BREAST LUMPECTOMY Right 09/04/2010   w/ Sentinel Node Biopsy   BREAST LUMPECTOMY  1/18037   Fibroadenoma. Left   carpel tunnel Right last five years (04/19/15)   CATARACT EXTRACTION W/ INTRAOCULAR LENS  IMPLANT, BILATERAL Bilateral 05/15/16 and 05/09/16   COLONOSCOPY     LIPOMA EXCISION  08/2002   L shoulder    NASAL SINUS SURGERY  1994   OCCIPITAL NEURECTOMY     Nerve block   POLYPECTOMY     pvd Right    rectal fissure repair  11/1955   TOTAL ABDOMINAL HYSTERECTOMY  1988   TOTAL KNEE ARTHROPLASTY Left 04/19/2024   Procedure: ARTHROPLASTY, KNEE, TOTAL;  Surgeon: Melodi Lerner, MD;  Location: [facility];  Service: Orthopedics;  Laterality: Left;   TRIGGER FINGER RELEASE     x3   UPPER  GASTROINTESTINAL ENDOSCOPY      Allergies  Allergen Reactions   Latex Rash   Sulfamethoxazole-Trimethoprim Nausea Only    Outpatient Encounter Medications as of 07/09/2024  Medication Sig   acetaminophen  (TYLENOL ) 325 MG tablet Take 2 tablets (650 mg total) by mouth every 4 (four) hours as needed.   buPROPion  (WELLBUTRIN  XL) 150 MG 24 hr tablet Take 300 mg by mouth daily.   cephALEXin (KEFLEX) 250 MG capsule Take 250 mg by mouth 3 (three) times daily. X 15 days   CRANBERRY PO Take 30,000 mg by mouth daily.   CVS SALINE NOSE SPRAY NA Place 1 spray into the nose daily.   fexofenadine (ALLEGRA) 180 MG tablet Take 180 mg by mouth daily.   fluticasone (FLONASE) 50 MCG/ACT nasal spray Place 2 sprays into both nostrils daily.   nebivolol  (BYSTOLIC ) 5 MG tablet Take 2.5 mg by mouth daily.   omeprazole  (PRILOSEC) 20 MG capsule TAKE 1 CAPSULE BY MOUTH EVERY DAY   ondansetron  (ZOFRAN ) 4 MG tablet Take 1 tablet (4 mg total) by mouth every 6 (six) hours as needed for nausea.   Probiotic Product (PROBIOTIC 10 ULTRA STRENGTH) CAPS Take 1 capsule by mouth daily.   cyanocobalamin  (VITAMIN B12) 1000 MCG/ML injection Inject 1,000 mcg into the muscle every 3 (three) months. Patient had shot on 5/8.   denosumab  (PROLIA ) 60 MG/ML SOSY injection Inject 60 mg into the skin every 6 (six) months.   [DISCONTINUED] chlorhexidine  (HIBICLENS ) 4 % external liquid Apply 15 mLs (1 Application total) topically as directed for 30 doses. Use as directed daily for 5 days every other week for 6 weeks.   [DISCONTINUED] docusate sodium  (COLACE) 100 MG capsule Take 1 capsule (100 mg total) by mouth 2 (two) times daily.   [DISCONTINUED] methocarbamol  (ROBAXIN ) 500 MG tablet Take 1 tablet (500 mg total) by mouth every 6 (six) hours as needed for muscle spasms.   [DISCONTINUED] ondansetron  (ZOFRAN -ODT) 4 MG disintegrating tablet Take 8 mg by mouth every 6 (six) hours as needed for nausea or vomiting.   [DISCONTINUED] oxyCODONE  (OXY  IR/ROXICODONE ) 5 MG immediate release tablet Take 1-2 tablets (5-10 mg total) by mouth every 6 (six) hours as needed for severe pain (pain score 7-10).   [DISCONTINUED] polyethylene glycol powder (GLYCOLAX /MIRALAX ) 17 GM/SCOOP powder Take 8.5 g by mouth every 3 (three) days.   [DISCONTINUED] traMADol  (ULTRAM ) 50 MG tablet Take 1-2 tablets (50-100 mg total) by mouth every 6 (six) hours as needed for moderate pain (pain score 4-6).   No facility-administered encounter medications on file as of 07/09/2024.    Review of Systems  Constitutional:  Positive for activity change and fatigue. Negative for appetite change, chills, diaphoresis, fever and unexpected weight change.  HENT:  Negative for congestion.   Respiratory:  Negative for cough, shortness of breath and wheezing.   Cardiovascular:  Negative for chest pain, palpitations and leg  swelling.  Gastrointestinal:  Positive for diarrhea. Negative for abdominal distention, abdominal pain and constipation.  Genitourinary:  Negative for difficulty urinating, dysuria, flank pain and frequency.  Musculoskeletal:  Positive for arthralgias. Negative for back pain, gait problem, joint swelling and myalgias.  Neurological:  Negative for dizziness, tremors, seizures, syncope, facial asymmetry, speech difficulty, weakness, light-headedness, numbness and headaches.  Psychiatric/Behavioral:  Negative for agitation, behavioral problems and confusion.     Immunization History  Administered Date(s) Administered   Hepatitis A 04/11/2004   Hepatitis B 04/11/2004   INFLUENZA, HIGH DOSE SEASONAL PF 08/16/2018   IPV 03/11/2004   Influenza Split 09/12/2010, 08/12/2011   Moderna Sars-Covid-2 Vaccination 11/22/2019, 12/23/2019   Pneumococcal Conjugate-13 11/12/1995   Respiratory Syncytial Virus Vaccine ,Recomb Aduvanted(Arexvy ) 09/30/2022   Td 04/12/1995   Tdap 04/11/2004   Zoster Recombinant(Shingrix) 12/25/2017, 02/24/2018   Zoster, Live 11/12/1995    Pertinent  Health Maintenance Due  Topic Date Due   INFLUENZA VACCINE  06/11/2024   DEXA SCAN  Completed   Colonoscopy  Discontinued      06/19/2019    2:29 PM  Fall Risk  (RETIRED) Patient Fall Risk Level Low fall risk      Data saved with a previous flowsheet row definition   Functional Status Survey:    Vitals:   07/10/24 0744  BP: 137/75  Pulse: (!) 105  Resp: 20  Temp: 97.8 F (36.6 C)  SpO2: 97%   There is no height or weight on file to calculate BMI. Physical Exam Vitals reviewed.  Constitutional:      General: She is not in acute distress.    Appearance: She is not diaphoretic.  HENT:     Head: Normocephalic and atraumatic.     Mouth/Throat:     Mouth: Mucous membranes are dry.  Neck:     Vascular: No JVD.  Cardiovascular:     Rate and Rhythm: Normal rate and regular rhythm.     Heart sounds: No murmur heard. Pulmonary:     Effort: Pulmonary effort is normal. No respiratory distress.     Breath sounds: Normal breath sounds. No wheezing.  Abdominal:     General: Bowel sounds are normal. There is no distension.     Palpations: Abdomen is soft.     Tenderness: There is no abdominal tenderness.  Musculoskeletal:     Right lower leg: No edema.     Left lower leg: No edema.  Skin:    General: Skin is warm and dry.  Neurological:     Mental Status: She is alert and oriented to person, place, and time.     Labs reviewed: Recent Labs    08/15/23 1142 04/12/24 1133 04/20/24 0353  NA 131* 127* 129*  K 4.5 4.3 3.8  CL 96 95* 100  CO2 27 21* 21*  GLUCOSE 94 81 164*  BUN 11 11 13   CREATININE 0.79 0.91 0.63  CALCIUM 9.3 8.6* 8.1*   Recent Labs    08/15/23 1142  AST 20  ALT 16  ALKPHOS 85  BILITOT 0.5  PROT 7.3  ALBUMIN 4.0   Recent Labs    08/15/23 1142 04/12/24 1133 04/20/24 0353  WBC 7.5 5.9 11.4*  NEUTROABS 5.4  --   --   HGB 13.6 13.0 10.3*  HCT 41.6 39.3 30.6*  MCV 94.6 95.6 94.7  PLT 444.0* 365 290   Lab Results   Component Value Date   TSH 3.57 08/15/2023   No results found for: HGBA1C No results  found for: CHOL, HDL, LDLCALC, LDLDIRECT, TRIG, CHOLHDL  Significant Diagnostic Results in last 30 days:  No results found.  Assessment/Plan Hyponatremia Hyponatremia improved but still below normal.  -likely due to fluid losses  - Administer normal saline at 80 cc/hour for one liter. - Repeat BMP in the morning.  AKI Improving  Cr 1.4<<0.9  Urinary tract infection UTI diagnosed with 2+ leukocytes. Asymptomatic but recurrent UTIs. Leukocytosis improved. - Continue Keflex. - Follow urine culture results.  Anemia, mild Mild anemia with hemoglobin drop. On mobic for arthritis issues and takes a PPI - Follow up with PCP for further evaluation and management.  Diarrhea Resolving after miralax   Hx of recurrent UTI  Hx of diverticulitis No abd pain or tenderness, no fever WBC improving   Labs/tests ordered:  CMP CBC

## 2024-07-15 DIAGNOSIS — D72819 Decreased white blood cell count, unspecified: Secondary | ICD-10-CM | POA: Diagnosis not present

## 2024-07-15 DIAGNOSIS — N3001 Acute cystitis with hematuria: Secondary | ICD-10-CM | POA: Diagnosis not present

## 2024-07-15 DIAGNOSIS — D72825 Bandemia: Secondary | ICD-10-CM | POA: Diagnosis not present

## 2024-07-15 DIAGNOSIS — M179 Osteoarthritis of knee, unspecified: Secondary | ICD-10-CM | POA: Diagnosis not present

## 2024-07-15 DIAGNOSIS — D649 Anemia, unspecified: Secondary | ICD-10-CM | POA: Diagnosis not present

## 2024-07-15 DIAGNOSIS — R5383 Other fatigue: Secondary | ICD-10-CM | POA: Diagnosis not present

## 2024-07-15 DIAGNOSIS — D75839 Thrombocytosis, unspecified: Secondary | ICD-10-CM | POA: Diagnosis not present

## 2024-07-15 DIAGNOSIS — I9581 Postprocedural hypotension: Secondary | ICD-10-CM | POA: Diagnosis not present

## 2024-07-15 DIAGNOSIS — E871 Hypo-osmolality and hyponatremia: Secondary | ICD-10-CM | POA: Diagnosis not present

## 2024-07-15 DIAGNOSIS — R197 Diarrhea, unspecified: Secondary | ICD-10-CM | POA: Diagnosis not present

## 2024-07-15 DIAGNOSIS — E538 Deficiency of other specified B group vitamins: Secondary | ICD-10-CM | POA: Diagnosis not present

## 2024-07-15 DIAGNOSIS — I10 Essential (primary) hypertension: Secondary | ICD-10-CM | POA: Diagnosis not present

## 2024-07-16 DIAGNOSIS — N3020 Other chronic cystitis without hematuria: Secondary | ICD-10-CM | POA: Diagnosis not present

## 2024-07-21 DIAGNOSIS — E785 Hyperlipidemia, unspecified: Secondary | ICD-10-CM | POA: Diagnosis not present

## 2024-07-21 DIAGNOSIS — I10 Essential (primary) hypertension: Secondary | ICD-10-CM | POA: Diagnosis not present

## 2024-07-23 DIAGNOSIS — M25662 Stiffness of left knee, not elsewhere classified: Secondary | ICD-10-CM | POA: Diagnosis not present

## 2024-07-23 DIAGNOSIS — R262 Difficulty in walking, not elsewhere classified: Secondary | ICD-10-CM | POA: Diagnosis not present

## 2024-07-23 DIAGNOSIS — M62562 Muscle wasting and atrophy, not elsewhere classified, left lower leg: Secondary | ICD-10-CM | POA: Diagnosis not present

## 2024-07-23 DIAGNOSIS — Z9181 History of falling: Secondary | ICD-10-CM | POA: Diagnosis not present

## 2024-07-23 DIAGNOSIS — M25562 Pain in left knee: Secondary | ICD-10-CM | POA: Diagnosis not present

## 2024-07-23 DIAGNOSIS — Z96652 Presence of left artificial knee joint: Secondary | ICD-10-CM | POA: Diagnosis not present

## 2024-07-28 DIAGNOSIS — R945 Abnormal results of liver function studies: Secondary | ICD-10-CM | POA: Diagnosis not present

## 2024-07-28 DIAGNOSIS — M792 Neuralgia and neuritis, unspecified: Secondary | ICD-10-CM | POA: Diagnosis not present

## 2024-07-28 DIAGNOSIS — J432 Centrilobular emphysema: Secondary | ICD-10-CM | POA: Diagnosis not present

## 2024-07-28 DIAGNOSIS — G500 Trigeminal neuralgia: Secondary | ICD-10-CM | POA: Diagnosis not present

## 2024-07-28 DIAGNOSIS — F329 Major depressive disorder, single episode, unspecified: Secondary | ICD-10-CM | POA: Diagnosis not present

## 2024-07-28 DIAGNOSIS — R7301 Impaired fasting glucose: Secondary | ICD-10-CM | POA: Diagnosis not present

## 2024-07-28 DIAGNOSIS — E559 Vitamin D deficiency, unspecified: Secondary | ICD-10-CM | POA: Diagnosis not present

## 2024-07-28 DIAGNOSIS — M810 Age-related osteoporosis without current pathological fracture: Secondary | ICD-10-CM | POA: Diagnosis not present

## 2024-07-28 DIAGNOSIS — Z0000 Encounter for general adult medical examination without abnormal findings: Secondary | ICD-10-CM | POA: Diagnosis not present

## 2024-07-28 DIAGNOSIS — I10 Essential (primary) hypertension: Secondary | ICD-10-CM | POA: Diagnosis not present

## 2024-07-28 DIAGNOSIS — E669 Obesity, unspecified: Secondary | ICD-10-CM | POA: Diagnosis not present

## 2024-07-28 DIAGNOSIS — Z23 Encounter for immunization: Secondary | ICD-10-CM | POA: Diagnosis not present

## 2024-07-30 ENCOUNTER — Telehealth (HOSPITAL_COMMUNITY): Payer: Self-pay | Admitting: Pharmacy Technician

## 2024-07-30 ENCOUNTER — Other Ambulatory Visit (HOSPITAL_COMMUNITY): Payer: Self-pay | Admitting: Internal Medicine

## 2024-07-30 NOTE — Telephone Encounter (Signed)
 Auth Submission: NO AUTH NEEDED Site of care: [facility] Payer: [employer] Medication & CPT/J Code(s) submitted: Prolia  (Denosumab ) [id number] Diagnosis Code: M81.0 Route of submission (phone, fax, portal):  Phone # Fax # Auth type: Buy/Bill HB Units/visits requested: 60mg  x 2 doses, q 6 months Reference number:  Approval from: 07/30/24 to 11/10/24    Dagoberto Armour, CPhT [facility] Phone: [phone] 07/30/2024

## 2024-08-05 DIAGNOSIS — Z9181 History of falling: Secondary | ICD-10-CM | POA: Diagnosis not present

## 2024-08-05 DIAGNOSIS — Z96652 Presence of left artificial knee joint: Secondary | ICD-10-CM | POA: Diagnosis not present

## 2024-08-05 DIAGNOSIS — M25662 Stiffness of left knee, not elsewhere classified: Secondary | ICD-10-CM | POA: Diagnosis not present

## 2024-08-05 DIAGNOSIS — M25562 Pain in left knee: Secondary | ICD-10-CM | POA: Diagnosis not present

## 2024-08-05 DIAGNOSIS — M62562 Muscle wasting and atrophy, not elsewhere classified, left lower leg: Secondary | ICD-10-CM | POA: Diagnosis not present

## 2024-08-05 DIAGNOSIS — R262 Difficulty in walking, not elsewhere classified: Secondary | ICD-10-CM | POA: Diagnosis not present

## 2024-08-12 DIAGNOSIS — Z9181 History of falling: Secondary | ICD-10-CM | POA: Diagnosis not present

## 2024-08-12 DIAGNOSIS — M62562 Muscle wasting and atrophy, not elsewhere classified, left lower leg: Secondary | ICD-10-CM | POA: Diagnosis not present

## 2024-08-12 DIAGNOSIS — Z96652 Presence of left artificial knee joint: Secondary | ICD-10-CM | POA: Diagnosis not present

## 2024-08-12 DIAGNOSIS — R262 Difficulty in walking, not elsewhere classified: Secondary | ICD-10-CM | POA: Diagnosis not present

## 2024-08-12 DIAGNOSIS — M25562 Pain in left knee: Secondary | ICD-10-CM | POA: Diagnosis not present

## 2024-08-12 DIAGNOSIS — M25662 Stiffness of left knee, not elsewhere classified: Secondary | ICD-10-CM | POA: Diagnosis not present

## 2024-08-16 DIAGNOSIS — Z85828 Personal history of other malignant neoplasm of skin: Secondary | ICD-10-CM | POA: Diagnosis not present

## 2024-08-16 DIAGNOSIS — L821 Other seborrheic keratosis: Secondary | ICD-10-CM | POA: Diagnosis not present

## 2024-08-16 DIAGNOSIS — L570 Actinic keratosis: Secondary | ICD-10-CM | POA: Diagnosis not present

## 2024-08-16 DIAGNOSIS — D225 Melanocytic nevi of trunk: Secondary | ICD-10-CM | POA: Diagnosis not present

## 2024-08-16 DIAGNOSIS — D692 Other nonthrombocytopenic purpura: Secondary | ICD-10-CM | POA: Diagnosis not present

## 2024-08-16 DIAGNOSIS — L905 Scar conditions and fibrosis of skin: Secondary | ICD-10-CM | POA: Diagnosis not present

## 2024-08-20 DIAGNOSIS — M62562 Muscle wasting and atrophy, not elsewhere classified, left lower leg: Secondary | ICD-10-CM | POA: Diagnosis not present

## 2024-08-20 DIAGNOSIS — Z9181 History of falling: Secondary | ICD-10-CM | POA: Diagnosis not present

## 2024-08-20 DIAGNOSIS — M25562 Pain in left knee: Secondary | ICD-10-CM | POA: Diagnosis not present

## 2024-08-20 DIAGNOSIS — Z96652 Presence of left artificial knee joint: Secondary | ICD-10-CM | POA: Diagnosis not present

## 2024-08-20 DIAGNOSIS — R262 Difficulty in walking, not elsewhere classified: Secondary | ICD-10-CM | POA: Diagnosis not present

## 2024-08-20 DIAGNOSIS — M25662 Stiffness of left knee, not elsewhere classified: Secondary | ICD-10-CM | POA: Diagnosis not present

## 2024-09-02 DIAGNOSIS — M62562 Muscle wasting and atrophy, not elsewhere classified, left lower leg: Secondary | ICD-10-CM | POA: Diagnosis not present

## 2024-09-02 DIAGNOSIS — M25562 Pain in left knee: Secondary | ICD-10-CM | POA: Diagnosis not present

## 2024-09-02 DIAGNOSIS — M25662 Stiffness of left knee, not elsewhere classified: Secondary | ICD-10-CM | POA: Diagnosis not present

## 2024-09-02 DIAGNOSIS — Z96652 Presence of left artificial knee joint: Secondary | ICD-10-CM | POA: Diagnosis not present

## 2024-09-02 DIAGNOSIS — Z9181 History of falling: Secondary | ICD-10-CM | POA: Diagnosis not present

## 2024-09-02 DIAGNOSIS — R262 Difficulty in walking, not elsewhere classified: Secondary | ICD-10-CM | POA: Diagnosis not present

## 2024-09-10 DIAGNOSIS — R262 Difficulty in walking, not elsewhere classified: Secondary | ICD-10-CM | POA: Diagnosis not present

## 2024-09-10 DIAGNOSIS — Z96652 Presence of left artificial knee joint: Secondary | ICD-10-CM | POA: Diagnosis not present

## 2024-09-10 DIAGNOSIS — M25662 Stiffness of left knee, not elsewhere classified: Secondary | ICD-10-CM | POA: Diagnosis not present

## 2024-09-10 DIAGNOSIS — M25562 Pain in left knee: Secondary | ICD-10-CM | POA: Diagnosis not present

## 2024-09-10 DIAGNOSIS — Z9181 History of falling: Secondary | ICD-10-CM | POA: Diagnosis not present

## 2024-09-10 DIAGNOSIS — M62562 Muscle wasting and atrophy, not elsewhere classified, left lower leg: Secondary | ICD-10-CM | POA: Diagnosis not present

## 2024-09-16 DIAGNOSIS — Z96652 Presence of left artificial knee joint: Secondary | ICD-10-CM | POA: Diagnosis not present

## 2024-09-16 DIAGNOSIS — M25662 Stiffness of left knee, not elsewhere classified: Secondary | ICD-10-CM | POA: Diagnosis not present

## 2024-09-16 DIAGNOSIS — R262 Difficulty in walking, not elsewhere classified: Secondary | ICD-10-CM | POA: Diagnosis not present

## 2024-09-16 DIAGNOSIS — M62562 Muscle wasting and atrophy, not elsewhere classified, left lower leg: Secondary | ICD-10-CM | POA: Diagnosis not present

## 2024-09-16 DIAGNOSIS — Z9181 History of falling: Secondary | ICD-10-CM | POA: Diagnosis not present

## 2024-09-16 DIAGNOSIS — M25562 Pain in left knee: Secondary | ICD-10-CM | POA: Diagnosis not present

## 2024-09-28 ENCOUNTER — Ambulatory Visit (HOSPITAL_COMMUNITY)
Admission: RE | Admit: 2024-09-28 | Discharge: 2024-09-28 | Disposition: A | Discharge status: Home / Self Care | Source: Ambulatory Visit | Attending: Internal Medicine | Admitting: Internal Medicine

## 2024-09-28 VITALS — BP 148/81 | HR 98 | Temp 97.2°F | Resp 17

## 2024-09-28 DIAGNOSIS — M810 Age-related osteoporosis without current pathological fracture: Secondary | ICD-10-CM | POA: Insufficient documentation

## 2024-09-28 MED ORDER — DENOSUMAB 60 MG/ML ~~LOC~~ SOSY
PREFILLED_SYRINGE | SUBCUTANEOUS | Status: AC
  Filled 2024-09-28: qty 1

## 2024-09-28 MED ORDER — DENOSUMAB 60 MG/ML ~~LOC~~ SOSY
60.0000 mg | PREFILLED_SYRINGE | Freq: Once | SUBCUTANEOUS | Status: AC
  Administered 2024-09-28: 60 mg via SUBCUTANEOUS

## 2024-10-11 DIAGNOSIS — R399 Unspecified symptoms and signs involving the genitourinary system: Secondary | ICD-10-CM | POA: Diagnosis not present

## 2024-10-11 DIAGNOSIS — R1024 Suprapubic pain: Secondary | ICD-10-CM | POA: Diagnosis not present

## 2025-03-29 ENCOUNTER — Encounter (HOSPITAL_COMMUNITY)

## 2025-06-16 ENCOUNTER — Encounter: Admitting: Radiology
